# Patient Record
Sex: Female | Born: 1980
Health system: Southern US, Community
[De-identification: ages and names within clinical notes are randomized; demographics above are authoritative.]

## PROBLEM LIST (undated history)

## (undated) DIAGNOSIS — E039 Hypothyroidism, unspecified: Secondary | ICD-10-CM

## (undated) DIAGNOSIS — T7840XA Allergy, unspecified, initial encounter: Secondary | ICD-10-CM

## (undated) DIAGNOSIS — F419 Anxiety disorder, unspecified: Secondary | ICD-10-CM

## (undated) DIAGNOSIS — G250 Essential tremor: Secondary | ICD-10-CM

## (undated) DIAGNOSIS — G43909 Migraine, unspecified, not intractable, without status migrainosus: Secondary | ICD-10-CM

## (undated) DIAGNOSIS — R55 Syncope and collapse: Secondary | ICD-10-CM

## (undated) DIAGNOSIS — G35 Multiple sclerosis: Secondary | ICD-10-CM

## (undated) DIAGNOSIS — R251 Tremor, unspecified: Secondary | ICD-10-CM

## (undated) DIAGNOSIS — F909 Attention-deficit hyperactivity disorder, unspecified type: Secondary | ICD-10-CM

## (undated) DIAGNOSIS — E079 Disorder of thyroid, unspecified: Secondary | ICD-10-CM

## (undated) DIAGNOSIS — Z87898 Personal history of other specified conditions: Secondary | ICD-10-CM

## (undated) DIAGNOSIS — J4520 Mild intermittent asthma, uncomplicated: Secondary | ICD-10-CM

## (undated) DIAGNOSIS — R269 Unspecified abnormalities of gait and mobility: Secondary | ICD-10-CM

## (undated) DIAGNOSIS — R112 Nausea with vomiting, unspecified: Secondary | ICD-10-CM

## (undated) DIAGNOSIS — Z973 Presence of spectacles and contact lenses: Secondary | ICD-10-CM

## (undated) DIAGNOSIS — E0590 Thyrotoxicosis, unspecified without thyrotoxic crisis or storm: Secondary | ICD-10-CM

## (undated) DIAGNOSIS — T8859XA Other complications of anesthesia, initial encounter: Secondary | ICD-10-CM

## (undated) DIAGNOSIS — K219 Gastro-esophageal reflux disease without esophagitis: Secondary | ICD-10-CM

## (undated) DIAGNOSIS — S62609A Fracture of unspecified phalanx of unspecified finger, initial encounter for closed fracture: Secondary | ICD-10-CM

## (undated) DIAGNOSIS — Z9889 Other specified postprocedural states: Secondary | ICD-10-CM

## (undated) HISTORY — PX: FOOT SURGERY: SHX648

## (undated) HISTORY — PX: FINGER SURGERY: SHX640

## (undated) HISTORY — DX: Thyrotoxicosis, unspecified without thyrotoxic crisis or storm: E05.90

## (undated) HISTORY — DX: Syncope and collapse: R55

## (undated) HISTORY — DX: Hypothyroidism, unspecified: E03.9

## (undated) HISTORY — DX: Tremor, unspecified: R25.1

## (undated) HISTORY — DX: Multiple sclerosis: G35

## (undated) HISTORY — DX: Migraine, unspecified, not intractable, without status migrainosus: G43.909

## (undated) HISTORY — DX: Disorder of thyroid, unspecified: E07.9

## (undated) HISTORY — DX: Allergy, unspecified, initial encounter: T78.40XA

## (undated) HISTORY — PX: BUNIONECTOMY: SHX129

## (undated) HISTORY — DX: Gastro-esophageal reflux disease without esophagitis: K21.9

## (undated) SURGERY — FUNDOPLICATION, STOMACH, INCISIONLESS, ORAL APPROACH
Anesthesia: General

---

## 2003-02-27 HISTORY — PX: BUNIONECTOMY: SHX129

## 2004-04-06 ENCOUNTER — Ambulatory Visit: Payer: Self-pay | Admitting: Internal Medicine

## 2004-10-26 ENCOUNTER — Ambulatory Visit: Payer: Self-pay | Admitting: Internal Medicine

## 2004-12-08 ENCOUNTER — Ambulatory Visit: Payer: Self-pay | Admitting: Internal Medicine

## 2004-12-12 ENCOUNTER — Ambulatory Visit: Payer: Self-pay

## 2004-12-15 ENCOUNTER — Ambulatory Visit: Payer: Self-pay | Admitting: Neurology

## 2005-01-03 ENCOUNTER — Ambulatory Visit: Payer: Self-pay | Admitting: Internal Medicine

## 2005-01-05 ENCOUNTER — Ambulatory Visit: Payer: Self-pay | Admitting: Neurology

## 2005-06-04 ENCOUNTER — Emergency Department: Payer: Self-pay | Admitting: Emergency Medicine

## 2005-06-18 ENCOUNTER — Ambulatory Visit: Payer: Self-pay | Admitting: Neurology

## 2005-06-21 ENCOUNTER — Ambulatory Visit (HOSPITAL_COMMUNITY): Admission: RE | Admit: 2005-06-21 | Discharge: 2005-06-21 | Payer: Self-pay | Admitting: Neurology

## 2005-06-28 ENCOUNTER — Ambulatory Visit: Payer: Self-pay | Admitting: General Practice

## 2005-07-12 ENCOUNTER — Ambulatory Visit: Payer: Self-pay | Admitting: Neurology

## 2005-07-27 ENCOUNTER — Ambulatory Visit: Payer: Self-pay | Admitting: General Practice

## 2005-10-15 ENCOUNTER — Encounter: Payer: Self-pay | Admitting: Neurology

## 2005-10-27 ENCOUNTER — Encounter: Payer: Self-pay | Admitting: Neurology

## 2005-12-28 ENCOUNTER — Ambulatory Visit: Payer: Self-pay | Admitting: Neurology

## 2006-01-03 ENCOUNTER — Ambulatory Visit: Payer: Self-pay | Admitting: Neurology

## 2006-01-09 ENCOUNTER — Encounter (HOSPITAL_COMMUNITY): Admission: RE | Admit: 2006-01-09 | Discharge: 2006-02-21 | Payer: Self-pay | Admitting: Neurology

## 2006-01-29 ENCOUNTER — Ambulatory Visit: Payer: Self-pay | Admitting: Oncology

## 2006-02-26 ENCOUNTER — Ambulatory Visit: Payer: Self-pay | Admitting: Oncology

## 2006-03-29 ENCOUNTER — Ambulatory Visit: Payer: Self-pay | Admitting: Oncology

## 2006-04-27 ENCOUNTER — Ambulatory Visit: Payer: Self-pay | Admitting: Oncology

## 2006-05-28 ENCOUNTER — Ambulatory Visit: Payer: Self-pay | Admitting: Oncology

## 2006-06-03 ENCOUNTER — Ambulatory Visit: Payer: Self-pay | Admitting: Neurology

## 2006-06-07 ENCOUNTER — Ambulatory Visit: Payer: Self-pay | Admitting: Neurology

## 2006-06-27 ENCOUNTER — Ambulatory Visit: Payer: Self-pay | Admitting: Oncology

## 2006-07-28 ENCOUNTER — Ambulatory Visit: Payer: Self-pay | Admitting: Oncology

## 2006-08-27 ENCOUNTER — Ambulatory Visit: Payer: Self-pay | Admitting: Oncology

## 2006-09-27 ENCOUNTER — Ambulatory Visit: Payer: Self-pay | Admitting: Oncology

## 2006-10-25 ENCOUNTER — Ambulatory Visit: Payer: Self-pay | Admitting: Internal Medicine

## 2006-10-28 ENCOUNTER — Ambulatory Visit: Payer: Self-pay | Admitting: Oncology

## 2006-11-25 ENCOUNTER — Ambulatory Visit: Payer: Self-pay | Admitting: Neurology

## 2006-11-27 ENCOUNTER — Ambulatory Visit: Payer: Self-pay | Admitting: Neurology

## 2006-11-27 ENCOUNTER — Ambulatory Visit: Payer: Self-pay | Admitting: Oncology

## 2006-12-28 ENCOUNTER — Ambulatory Visit: Payer: Self-pay | Admitting: Oncology

## 2007-01-27 ENCOUNTER — Ambulatory Visit: Payer: Self-pay | Admitting: Oncology

## 2007-02-27 ENCOUNTER — Ambulatory Visit: Payer: Self-pay | Admitting: Oncology

## 2007-03-30 ENCOUNTER — Ambulatory Visit: Payer: Self-pay | Admitting: Oncology

## 2007-04-27 ENCOUNTER — Ambulatory Visit: Payer: Self-pay | Admitting: Oncology

## 2007-04-30 ENCOUNTER — Ambulatory Visit: Payer: Self-pay | Admitting: Internal Medicine

## 2007-05-13 ENCOUNTER — Ambulatory Visit: Payer: Self-pay | Admitting: Neurology

## 2007-05-20 ENCOUNTER — Ambulatory Visit: Payer: Self-pay

## 2007-05-21 ENCOUNTER — Ambulatory Visit: Payer: Self-pay | Admitting: Neurology

## 2007-05-26 ENCOUNTER — Ambulatory Visit: Payer: Self-pay | Admitting: Internal Medicine

## 2007-05-28 ENCOUNTER — Ambulatory Visit: Payer: Self-pay | Admitting: Oncology

## 2007-06-12 ENCOUNTER — Ambulatory Visit: Payer: Self-pay | Admitting: Specialist

## 2007-06-17 ENCOUNTER — Ambulatory Visit: Payer: Self-pay | Admitting: Specialist

## 2007-06-27 ENCOUNTER — Ambulatory Visit: Payer: Self-pay | Admitting: Oncology

## 2007-07-01 ENCOUNTER — Ambulatory Visit: Payer: Self-pay | Admitting: Allergy

## 2007-07-28 ENCOUNTER — Ambulatory Visit: Payer: Self-pay | Admitting: Oncology

## 2007-08-27 ENCOUNTER — Ambulatory Visit: Payer: Self-pay | Admitting: Oncology

## 2007-09-27 ENCOUNTER — Ambulatory Visit: Payer: Self-pay | Admitting: Oncology

## 2007-10-28 ENCOUNTER — Ambulatory Visit: Payer: Self-pay | Admitting: Oncology

## 2007-11-19 ENCOUNTER — Ambulatory Visit: Payer: Self-pay | Admitting: Neurology

## 2007-11-27 ENCOUNTER — Ambulatory Visit: Payer: Self-pay | Admitting: Oncology

## 2007-12-28 ENCOUNTER — Ambulatory Visit: Payer: Self-pay | Admitting: Oncology

## 2008-01-19 ENCOUNTER — Ambulatory Visit: Payer: Self-pay | Admitting: Sports Medicine

## 2008-01-27 ENCOUNTER — Ambulatory Visit: Payer: Self-pay | Admitting: Oncology

## 2008-02-27 ENCOUNTER — Ambulatory Visit: Payer: Self-pay | Admitting: Oncology

## 2008-02-27 HISTORY — PX: KNEE ARTHROSCOPY W/ ACL RECONSTRUCTION: SHX1858

## 2008-03-01 ENCOUNTER — Ambulatory Visit: Payer: Self-pay | Admitting: Neurology

## 2008-03-29 ENCOUNTER — Ambulatory Visit: Payer: Self-pay | Admitting: Oncology

## 2008-03-29 HISTORY — PX: KNEE ARTHROSCOPY W/ ACL RECONSTRUCTION: SHX1858

## 2008-04-26 ENCOUNTER — Ambulatory Visit: Payer: Self-pay | Admitting: Oncology

## 2008-05-12 ENCOUNTER — Ambulatory Visit: Payer: Self-pay | Admitting: Neurology

## 2008-05-13 ENCOUNTER — Ambulatory Visit: Payer: Self-pay | Admitting: Neurology

## 2008-05-24 ENCOUNTER — Ambulatory Visit: Payer: Self-pay | Admitting: Neurology

## 2008-05-27 ENCOUNTER — Ambulatory Visit: Payer: Self-pay | Admitting: Oncology

## 2008-06-26 ENCOUNTER — Ambulatory Visit: Payer: Self-pay | Admitting: Oncology

## 2008-07-27 ENCOUNTER — Ambulatory Visit: Payer: Self-pay | Admitting: Oncology

## 2008-08-26 ENCOUNTER — Ambulatory Visit: Payer: Self-pay | Admitting: Oncology

## 2008-09-26 ENCOUNTER — Ambulatory Visit: Payer: Self-pay | Admitting: Oncology

## 2008-10-27 ENCOUNTER — Ambulatory Visit: Payer: Self-pay | Admitting: Oncology

## 2008-11-02 ENCOUNTER — Other Ambulatory Visit: Payer: Self-pay | Admitting: Internal Medicine

## 2008-11-15 ENCOUNTER — Ambulatory Visit: Payer: Self-pay | Admitting: Neurology

## 2008-11-22 ENCOUNTER — Other Ambulatory Visit: Payer: Self-pay | Admitting: Neurology

## 2008-11-26 ENCOUNTER — Ambulatory Visit: Payer: Self-pay | Admitting: Oncology

## 2008-12-27 ENCOUNTER — Ambulatory Visit: Payer: Self-pay | Admitting: Oncology

## 2009-01-26 ENCOUNTER — Ambulatory Visit: Payer: Self-pay | Admitting: Oncology

## 2009-02-10 ENCOUNTER — Ambulatory Visit: Payer: Self-pay | Admitting: General Practice

## 2009-02-26 ENCOUNTER — Ambulatory Visit: Payer: Self-pay | Admitting: Oncology

## 2009-03-29 ENCOUNTER — Ambulatory Visit: Payer: Self-pay | Admitting: Oncology

## 2009-04-26 ENCOUNTER — Ambulatory Visit: Payer: Self-pay | Admitting: Oncology

## 2009-04-27 ENCOUNTER — Other Ambulatory Visit: Payer: Self-pay | Admitting: Diagnostic Radiology

## 2009-04-28 ENCOUNTER — Ambulatory Visit: Payer: Self-pay | Admitting: Neurology

## 2009-05-27 ENCOUNTER — Ambulatory Visit: Payer: Self-pay | Admitting: Oncology

## 2009-06-26 ENCOUNTER — Ambulatory Visit: Payer: Self-pay | Admitting: Oncology

## 2009-07-27 ENCOUNTER — Ambulatory Visit: Payer: Self-pay | Admitting: Oncology

## 2009-08-26 ENCOUNTER — Ambulatory Visit: Payer: Self-pay | Admitting: Oncology

## 2009-09-26 ENCOUNTER — Ambulatory Visit: Payer: Self-pay | Admitting: Oncology

## 2009-10-27 ENCOUNTER — Ambulatory Visit: Payer: Self-pay | Admitting: Oncology

## 2009-11-09 ENCOUNTER — Other Ambulatory Visit: Payer: Self-pay | Admitting: Internal Medicine

## 2009-11-09 ENCOUNTER — Other Ambulatory Visit: Payer: Self-pay | Admitting: Radiology

## 2009-11-09 ENCOUNTER — Other Ambulatory Visit: Payer: Self-pay | Admitting: Neurology

## 2009-11-10 ENCOUNTER — Ambulatory Visit: Payer: Self-pay | Admitting: Neurology

## 2009-11-26 ENCOUNTER — Ambulatory Visit: Payer: Self-pay | Admitting: Oncology

## 2009-12-27 ENCOUNTER — Ambulatory Visit: Payer: Self-pay | Admitting: Oncology

## 2010-01-26 ENCOUNTER — Ambulatory Visit: Payer: Self-pay | Admitting: Oncology

## 2010-02-26 ENCOUNTER — Ambulatory Visit: Payer: Self-pay | Admitting: Oncology

## 2010-03-29 ENCOUNTER — Ambulatory Visit: Payer: Self-pay | Admitting: Oncology

## 2010-04-27 ENCOUNTER — Ambulatory Visit: Payer: Self-pay | Admitting: Oncology

## 2010-05-03 ENCOUNTER — Other Ambulatory Visit: Payer: Self-pay | Admitting: Radiology

## 2010-05-03 ENCOUNTER — Other Ambulatory Visit: Payer: Self-pay | Admitting: Neurology

## 2010-05-04 ENCOUNTER — Ambulatory Visit: Payer: Self-pay | Admitting: Neurology

## 2010-05-28 ENCOUNTER — Ambulatory Visit: Payer: Self-pay | Admitting: Oncology

## 2010-06-27 ENCOUNTER — Ambulatory Visit: Payer: Self-pay | Admitting: Oncology

## 2010-07-28 ENCOUNTER — Ambulatory Visit: Payer: Self-pay | Admitting: Oncology

## 2010-08-27 ENCOUNTER — Ambulatory Visit: Payer: Self-pay | Admitting: Oncology

## 2010-09-17 ENCOUNTER — Ambulatory Visit: Payer: Self-pay | Admitting: Internal Medicine

## 2010-09-27 ENCOUNTER — Ambulatory Visit: Payer: Self-pay | Admitting: Oncology

## 2010-10-28 ENCOUNTER — Ambulatory Visit: Payer: Self-pay | Admitting: Oncology

## 2010-11-02 ENCOUNTER — Other Ambulatory Visit: Payer: Self-pay | Admitting: Neurology

## 2010-11-03 ENCOUNTER — Ambulatory Visit: Payer: Self-pay | Admitting: Neurology

## 2010-11-27 ENCOUNTER — Ambulatory Visit: Payer: Self-pay | Admitting: Oncology

## 2010-12-28 ENCOUNTER — Ambulatory Visit: Payer: Self-pay | Admitting: Oncology

## 2011-01-27 ENCOUNTER — Ambulatory Visit: Payer: Self-pay | Admitting: Oncology

## 2011-02-27 ENCOUNTER — Ambulatory Visit: Payer: Self-pay | Admitting: Oncology

## 2011-03-28 ENCOUNTER — Other Ambulatory Visit: Payer: Self-pay | Admitting: Internal Medicine

## 2011-03-28 LAB — TSH: Thyroid Stimulating Horm: 1.42 u[IU]/mL

## 2011-03-28 LAB — T4, FREE: Free Thyroxine: 1.2 ng/dL (ref 0.76–1.46)

## 2011-03-30 ENCOUNTER — Ambulatory Visit: Payer: Self-pay | Admitting: Oncology

## 2011-04-27 ENCOUNTER — Ambulatory Visit: Payer: Self-pay | Admitting: Oncology

## 2011-05-15 ENCOUNTER — Other Ambulatory Visit: Payer: Self-pay | Admitting: Psychiatry

## 2011-05-15 LAB — COMPREHENSIVE METABOLIC PANEL
Albumin: 3.8 g/dL (ref 3.4–5.0)
Alkaline Phosphatase: 31 U/L — ABNORMAL LOW (ref 50–136)
Anion Gap: 12 (ref 7–16)
BUN: 14 mg/dL (ref 7–18)
Calcium, Total: 8.9 mg/dL (ref 8.5–10.1)
Chloride: 106 mmol/L (ref 98–107)
EGFR (African American): >60
EGFR (Non-African Amer.): >60
Glucose: 92 mg/dL (ref 65–99)
Osmolality: 283 (ref 275–301)
Potassium: 3.7 mmol/L (ref 3.5–5.1)
SGOT(AST): 20 U/L (ref 15–37)
Sodium: 142 mmol/L (ref 136–145)
Total Protein: 7.3 g/dL (ref 6.4–8.2)

## 2011-05-28 ENCOUNTER — Ambulatory Visit: Payer: Self-pay | Admitting: Oncology

## 2011-06-27 ENCOUNTER — Ambulatory Visit: Payer: Self-pay | Admitting: Oncology

## 2011-07-28 ENCOUNTER — Ambulatory Visit: Payer: Self-pay | Admitting: Oncology

## 2011-08-27 ENCOUNTER — Ambulatory Visit: Payer: Self-pay | Admitting: Oncology

## 2011-09-27 ENCOUNTER — Ambulatory Visit: Payer: Self-pay | Admitting: Oncology

## 2011-10-28 ENCOUNTER — Ambulatory Visit: Payer: Self-pay | Admitting: Oncology

## 2011-10-31 ENCOUNTER — Ambulatory Visit: Payer: Self-pay | Admitting: Psychiatry

## 2011-11-06 DIAGNOSIS — N3020 Other chronic cystitis without hematuria: Secondary | ICD-10-CM | POA: Insufficient documentation

## 2011-11-06 DIAGNOSIS — N393 Stress incontinence (female) (male): Secondary | ICD-10-CM | POA: Insufficient documentation

## 2011-11-06 DIAGNOSIS — R339 Retention of urine, unspecified: Secondary | ICD-10-CM | POA: Insufficient documentation

## 2011-11-06 DIAGNOSIS — R310 Gross hematuria: Secondary | ICD-10-CM | POA: Insufficient documentation

## 2011-11-06 DIAGNOSIS — R350 Frequency of micturition: Secondary | ICD-10-CM | POA: Insufficient documentation

## 2011-11-07 ENCOUNTER — Ambulatory Visit: Payer: Self-pay | Admitting: Urology

## 2011-11-27 ENCOUNTER — Ambulatory Visit: Payer: Self-pay | Admitting: Oncology

## 2011-12-28 ENCOUNTER — Ambulatory Visit: Payer: Self-pay | Admitting: Oncology

## 2012-01-22 ENCOUNTER — Other Ambulatory Visit: Payer: Self-pay | Admitting: Obstetrics and Gynecology

## 2012-01-22 LAB — GLUCOSE, RANDOM: Glucose: 95 mg/dL (ref 65–99)

## 2012-01-27 ENCOUNTER — Ambulatory Visit: Payer: Self-pay | Admitting: Oncology

## 2012-02-27 ENCOUNTER — Ambulatory Visit: Payer: Self-pay | Admitting: Oncology

## 2012-03-29 ENCOUNTER — Ambulatory Visit: Payer: Self-pay | Admitting: Oncology

## 2012-04-26 ENCOUNTER — Ambulatory Visit: Payer: Self-pay | Admitting: Oncology

## 2012-04-26 ENCOUNTER — Ambulatory Visit: Payer: Self-pay

## 2012-05-09 ENCOUNTER — Other Ambulatory Visit: Payer: Self-pay | Admitting: Psychiatry

## 2012-05-09 ENCOUNTER — Other Ambulatory Visit: Payer: Self-pay | Admitting: Internal Medicine

## 2012-05-09 LAB — COMPREHENSIVE METABOLIC PANEL
Albumin: 3.4 g/dL (ref 3.4–5.0)
Alkaline Phosphatase: 55 U/L (ref 50–136)
Bilirubin,Total: 0.7 mg/dL (ref 0.2–1.0)
Calcium, Total: 8.6 mg/dL (ref 8.5–10.1)
Creatinine: 0.95 mg/dL (ref 0.60–1.30)
EGFR (African American): >60
EGFR (Non-African Amer.): >60
Glucose: 84 mg/dL (ref 65–99)
Osmolality: 276 (ref 275–301)
Potassium: 3.7 mmol/L (ref 3.5–5.1)
Total Protein: 6.7 g/dL (ref 6.4–8.2)

## 2012-05-09 LAB — T4, FREE: Free Thyroxine: 1.13 ng/dL (ref 0.76–1.46)

## 2012-05-27 ENCOUNTER — Ambulatory Visit: Payer: Self-pay | Admitting: Oncology

## 2012-06-26 ENCOUNTER — Ambulatory Visit: Payer: Self-pay | Admitting: Oncology

## 2012-07-08 ENCOUNTER — Telehealth: Payer: Self-pay | Admitting: Neurology

## 2012-07-08 NOTE — Telephone Encounter (Signed)
Called patient her father answered and stated she was at work he will have her call back.

## 2012-07-17 ENCOUNTER — Telehealth: Payer: Self-pay | Admitting: Neurology

## 2012-07-23 ENCOUNTER — Telehealth: Payer: Self-pay | Admitting: Neurology

## 2012-07-23 NOTE — Telephone Encounter (Signed)
Patient is calling to make an appointment with her assigned Neurologist.  She is a former patient of Dr. Imagene Gurney.  She would like to come in asap, however, having no issues at this time.  Her call back phone is:  [phone] and you can leave a voice mail message per patient, if she does not answer the phone.

## 2012-07-23 NOTE — Telephone Encounter (Signed)
Printed note off and gave to Sandy Young to assign and get scheduled.  

## 2012-07-24 ENCOUNTER — Telehealth: Payer: Self-pay | Admitting: Diagnostic Neuroimaging

## 2012-07-24 NOTE — Telephone Encounter (Signed)
Former pt of Dr Sandria Manly.  Left message on pt's work number letting her know that we have received her call and that she is being assigned to a physician and will be called with an appointment once the assignment has been made.

## 2012-07-27 ENCOUNTER — Ambulatory Visit: Payer: Self-pay | Admitting: Oncology

## 2012-08-26 ENCOUNTER — Ambulatory Visit: Payer: Self-pay | Admitting: Oncology

## 2012-09-06 ENCOUNTER — Encounter: Payer: Self-pay | Admitting: Nurse Practitioner

## 2012-09-06 DIAGNOSIS — M25562 Pain in left knee: Secondary | ICD-10-CM

## 2012-09-06 DIAGNOSIS — R948 Abnormal results of function studies of other organs and systems: Secondary | ICD-10-CM

## 2012-09-06 DIAGNOSIS — G35 Multiple sclerosis: Secondary | ICD-10-CM

## 2012-09-06 DIAGNOSIS — R209 Unspecified disturbances of skin sensation: Secondary | ICD-10-CM | POA: Insufficient documentation

## 2012-09-06 DIAGNOSIS — G8110 Spastic hemiplegia affecting unspecified side: Secondary | ICD-10-CM

## 2012-09-06 DIAGNOSIS — M25569 Pain in unspecified knee: Secondary | ICD-10-CM | POA: Insufficient documentation

## 2012-09-06 DIAGNOSIS — R471 Dysarthria and anarthria: Secondary | ICD-10-CM | POA: Insufficient documentation

## 2012-09-12 ENCOUNTER — Ambulatory Visit (INDEPENDENT_AMBULATORY_CARE_PROVIDER_SITE_OTHER): Payer: 59 | Admitting: Neurology

## 2012-09-12 ENCOUNTER — Encounter: Payer: Self-pay | Admitting: Neurology

## 2012-09-12 VITALS — BP 120/79 | HR 102 | Ht 62.5 in | Wt 162.0 lb

## 2012-09-12 DIAGNOSIS — G35 Multiple sclerosis: Secondary | ICD-10-CM

## 2012-09-12 DIAGNOSIS — R5381 Other malaise: Secondary | ICD-10-CM

## 2012-09-12 DIAGNOSIS — R4189 Other symptoms and signs involving cognitive functions and awareness: Secondary | ICD-10-CM

## 2012-09-12 DIAGNOSIS — R5383 Other fatigue: Secondary | ICD-10-CM

## 2012-09-12 MED ORDER — ALPRAZOLAM 0.5 MG PO TABS: 0.5000 mg | ORAL_TABLET | Freq: Two times a day (BID) | ORAL | Status: DC

## 2012-09-12 MED ORDER — METHYLPHENIDATE HCL ER (OSM) 18 MG PO TBCR: 18.0000 mg | EXTENDED_RELEASE_TABLET | ORAL | Status: DC

## 2012-09-12 NOTE — Progress Notes (Signed)
[name] is a 31y/o woman with diagnosis of RRMS presenting for follow up/re-establishing care in our office. She was last seen at [facility] in 10/2010 with Dr Carrie Nelson. Since that visit she has been following up with...  Interim: Since her last visit she has been following with Dr Carrie Nelson at [facility] in [place], [place]. He recently moved out of the area so she has returned to [facility]. Overall states she is doing well. No flares ups of MS noted in over 2 years. Continues on Tysabri monthly. Has been on Tysabri since 12/2005. Continues to tolerate it well. Had JC virus ab negative test in April 2014, had normal LFTs at that time too.  Main concern today is concern with cognitive difficulties and fatigue. She feels this is starting to cause difficulties with work and life. Was started on Ritalin by Dr Carrie Nelson, was started on IR dose but notes that the benefit does not last for her. Is interested in trying long acting version.   Otherwise no acute complaints. Remains active. Off baclofen, denies any muscle spasms.  Has not had MRI imaging in over a year. Wishes to hold off at this point.  Prior visit per Dr Carrie Nelson: left-handed white single female from [place], [place] with a history of multiple sclerosis diagnosed in October 2006 but in retrospect was probably present on MRI 03/2002 showing 2 areas of long T2 signal present in the left basal ganglia when she was being evaluated for syncope. She had left facial numbness, decreased taste,  and left arm numbness.Evaluation for disorders that mimic MS has been negative. CSF studies were positive. Last MRI of the brain and  cervical spine 11/03/10 as compared with previous MRIs 05/04/10 shows "stable findings", no lesions are present in the cervical spine and approximately 30 are in the supratentorial regions of the brain and brainstem. I reviewed this with the patient today. Her cervical spine MRI is to T6 in the sagittal plane. She has been on  Tysabri since 01/09/2006 with approximately 58 treatments given. Last blood studies 11/02/10 showed normal CBC, CMP, and negativeJC virus antibodies.  ROS: Constitutional: + fatigue and cognitive issues Denies fever, weight loss, weight gain Eyes: Denies blurry vision, loss of vision, eye pain CV: Denies chest pain, palpitations, syncope Pulm: Denies SOB, dyspnea, cough GI:  Denies constipation, diarrhea, abdominal pain MSK: Denies spasms, muscle pain, weakness Neuro: Denies HA, vertigo, falls, tremor Psyc:  Denies depression, hallucinatons, confusion Hem/lymph: Denies easy bleeding, bruising, no swollen nodes Allergic: No runny nose, hives, rashes All other ROS are negative    Exam: Gen: NAD, pleasant CV: RRR no m/r/g Pulm: CTA bilat Abd: +BS, soft, NT, ND   Neuor Exam: MS: AA&Ox3, appropriately interactive, normal affect   Attention: WORLD backwards  Speech: fluent w/o paraphasic error  Memory: good recent and remote recall  CN: PERRL, EOMI no nystagmus, no ptosis, sensation intact to LT V1-V3 bilat, face symmetric, no weakness, hearing grossly intact, palate elevates symmetrically, shoulder shrug 5/5 bilat,  tongue protrudes midline, no fasiculations noted.  Motor: normal bulk and tone Strength: 5/5  In all extremities  Coord: rapid alternating and point-to-point (FNF, HTS) movements intact. Refl:  Symmetrical, bilat downgoing toes  Sens: LT intact in all extremities  Gait: posture, stance, stride and arm-swing normal. Tandem gait intact. Able to walk on heels and toes. Romberg absent.  A/P: [name] is a 31y/o woman with a pmhx of RRMS for which she is well controlled on monthly Tysabri.  She has been on Tysabri since Nov 2011 and continues to be JC virus negative with otherwise normal lab workup. She has not had a flare up in over 2 years. Last imaging was >93yr ago. Main concern at todays visit is fatigue and cognitive issues. She has been taking ritalin  10mg  which gives benefit but does not give prolonged relief.  -will change from ritalin to concerta 18mg  daily. Patient educated on benefits and risks of this medication -in the future if no benefit would consider trial of Provigil or Amantadine -continue monthly Tysabri. Will check labs at next visit and then plan for quarterly monitoring per guidelines -will closely monitor for signs of PML -follow up in 2 months   A total of 60 minutes was spent in with this patient. Over half this time was spent on counseling patient on the diagnosis and different therapeutic options available.

## 2012-09-12 NOTE — Patient Instructions (Addendum)
Overall you are doing fairly well but I do want to suggest a few things today:   As far as your medications are concerned, I would like to suggest a few things: -we are going to switch you from immediate release ritalin to Concerta which is a long acting form. You will now be taking 18mg  tablets once a day in the morning. I suggest trying this on the weekend and if you are unable to tolerate then consider going back to the immediate release form. -a refill was given of your xanax  As far as diagnostic testing: we discussed MRI of the brain and spine but will hold off at this time. Please obtain old images for [place]. We will likely repeat this test in the next year. We will hold off on lab work at this time and will plan to repeat at the next visit  I would like to see you back in 2 months, sooner if we need to. Please call us with any interim questions, concerns, problems, updates or refill requests.   Please also call us for any test results so we can go over those with you on the phone.  My clinical assistant and will answer any of your questions and relay your messages to me and also relay most of my messages to you.   Our phone number is [phone]. We also have an after hours call service for urgent matters and there is a physician on-call for urgent questions. For any emergencies you know to call 911 or go to the nearest emergency room

## 2012-09-15 ENCOUNTER — Telehealth: Payer: Self-pay | Admitting: Neurology

## 2012-09-16 NOTE — Telephone Encounter (Signed)
Lupita Leash was taking care of this issue, because the patient has changed physician and so this has to go the authorization process again.

## 2012-09-18 ENCOUNTER — Telehealth: Payer: Self-pay | Admitting: Neurology

## 2012-09-26 ENCOUNTER — Ambulatory Visit: Payer: Self-pay | Admitting: Oncology

## 2012-09-29 ENCOUNTER — Telehealth: Payer: Self-pay | Admitting: Neurology

## 2012-09-29 DIAGNOSIS — Z0289 Encounter for other administrative examinations: Secondary | ICD-10-CM

## 2012-09-29 NOTE — Telephone Encounter (Signed)
I contacted the pharmacy.  They state insurance will not cover Concerta.  I have submitted prior auth paperwork to ins, pending response.

## 2012-10-02 ENCOUNTER — Telehealth: Payer: Self-pay

## 2012-10-02 NOTE — Telephone Encounter (Signed)
[employer] faxed [place] an approval letter for Concerta (Methylphenidate) effective 10/01/2012-10/01/2013 ID # [id number]

## 2012-10-08 ENCOUNTER — Telehealth: Payer: Self-pay | Admitting: Neurology

## 2012-10-16 ENCOUNTER — Telehealth: Payer: Self-pay

## 2012-10-16 NOTE — Telephone Encounter (Signed)
I called and spoke with patient. I reviewed her present status. I will fax her a release of information today to [phone]. When forms are signed by MD, they will be faxed to [facility] at Fax: [phone]

## 2012-10-17 ENCOUNTER — Telehealth: Payer: Self-pay | Admitting: *Deleted

## 2012-10-21 ENCOUNTER — Other Ambulatory Visit: Payer: Self-pay | Admitting: Neurology

## 2012-10-21 DIAGNOSIS — G35 Multiple sclerosis: Secondary | ICD-10-CM

## 2012-10-21 DIAGNOSIS — R4189 Other symptoms and signs involving cognitive functions and awareness: Secondary | ICD-10-CM

## 2012-10-21 DIAGNOSIS — R5381 Other malaise: Secondary | ICD-10-CM

## 2012-10-21 DIAGNOSIS — R5383 Other fatigue: Secondary | ICD-10-CM

## 2012-10-21 MED ORDER — METHYLPHENIDATE HCL ER (OSM) 18 MG PO TBCR: 18.0000 mg | EXTENDED_RELEASE_TABLET | ORAL | Status: DC

## 2012-10-22 NOTE — Telephone Encounter (Signed)
Rx is ready for pick up.  I called the patient, got no answer.  Left message.  

## 2012-10-27 ENCOUNTER — Ambulatory Visit: Payer: Self-pay | Admitting: Oncology

## 2012-11-11 ENCOUNTER — Ambulatory Visit: Payer: 59 | Admitting: Neurology

## 2012-11-13 ENCOUNTER — Ambulatory Visit: Payer: 59 | Admitting: Neurology

## 2012-11-21 ENCOUNTER — Ambulatory Visit (INDEPENDENT_AMBULATORY_CARE_PROVIDER_SITE_OTHER): Payer: 59 | Admitting: Neurology

## 2012-11-21 ENCOUNTER — Encounter: Payer: Self-pay | Admitting: Neurology

## 2012-11-21 VITALS — BP 122/80 | HR 93 | Ht 62.0 in | Wt 160.0 lb

## 2012-11-21 DIAGNOSIS — G35 Multiple sclerosis: Secondary | ICD-10-CM

## 2012-11-21 DIAGNOSIS — R5383 Other fatigue: Secondary | ICD-10-CM

## 2012-11-21 DIAGNOSIS — R5381 Other malaise: Secondary | ICD-10-CM

## 2012-11-21 NOTE — Progress Notes (Signed)
Provider:  Dr Hosie Poisson Referring Provider: Lynnea Ferrier, MD Primary Care Physician:  Daniel Nones  CC: Carrie follow up  HPI:  Carrie Nelson is a 32 y.o. female here as a follow up. States she is doing well overall, no new complaints are concerned since last visit. No new weakness no sensory changes no visual changes no gait instability no falls no muscle spasms. She had her last in summary on one week ago. At last visit she was started on Concerta 18 mg daily for fatigue and impaired concentration. She reports this gives a very good benefit, has given her more energy during the daytime and still allowed her to sleep at night.  She reports that her and her husband are considering having children and wishes to discuss medication management in pregnancy. She notes that she would like her husband her and this will be discussed at her next visit. We discussed the need for blood work, including JC virus MI at this time and repeat MRI imaging as it is been a few years.   Concerns/Questions:Review of Systems: Out of a complete 14 system review, the patient complains of only the following symptoms, and all other reviewed systems are negative. No positive review of systems  History   Social History  . Marital Status: Single    Spouse Name: N/A    Number of Children: o  . Years of Education: N/A   Occupational History  . Not on file.   Social History Main Topics  . Smoking status: Never Smoker   . Smokeless tobacco: Never Used  . Alcohol Use: Yes     Comment: minimal weekly   . Drug Use: No  . Sexual Activity: Not on file   Other Topics Concern  . Not on file   Social History Narrative   Patient lives with parents.   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    Patient has a Probation officer.     Family History  Problem Relation Age of Onset  . Prostate cancer Father   . Diabetes Father   . Arthritis    . Dementia    .  Hypertension    . Heart disease      Past Medical History  Diagnosis Date  . Thyroid disease   . Syncope   . Multiple sclerosis   . Tremor     Past Surgical History  Procedure Laterality Date  . Knee arthroscopy w/ acl reconstruction Left 2010  . Bunionectomy    . Foot surgery      Current Outpatient Prescriptions  Medication Sig Dispense Refill  . ALPRAZolam (XANAX) 0.5 MG tablet Take 1 tablet (0.5 mg total) by mouth 2 (two) times daily.  60 tablet  3  . buPROPion (WELLBUTRIN XL) 150 MG 24 hr tablet Take 150 mg by mouth daily.      Marland Kitchen buPROPion (WELLBUTRIN) 100 MG tablet Take 100 mg by mouth 2 (two) times daily.      . Cetirizine HCl (ZYRTEC ALLERGY) 10 MG CAPS Take 10 mg by mouth daily.      . cholecalciferol (VITAMIN D) 1000 UNITS tablet Take 2,000 Units by mouth daily.       Marland Kitchen levothyroxine (SYNTHROID, LEVOTHROID) 25 MCG tablet Take 25 mcg by mouth daily before breakfast.      . methylphenidate (CONCERTA) 18 MG CR tablet Take 1 tablet (18 mg total) by mouth every morning.  30 tablet  0  . montelukast (SINGULAIR) 10 MG tablet Take 10 mg by mouth at bedtime.      . Multiple Vitamins-Minerals (MULTIVITAMIN PO) Take by mouth.      . Natalizumab (TYSABRI IV) Inject into the vein every 30 (thirty) days.      . NORETHINDRONE PO Take by mouth daily.       No current facility-administered medications for this visit.    Allergies as of 11/21/2012  . (No Known Allergies)    Vitals: BP 122/80  Pulse 93  Ht 5\' 2"  (1.575 m)  Wt 160 lb (72.576 kg)  BMI 29.26 kg/m2 Last Weight:  Wt Readings from Last 1 Encounters:  11/21/12 160 lb (72.576 kg)   Last Height:   Ht Readings from Last 1 Encounters:  11/21/12 5\' 2"  (1.575 m)     Physical exam: Exam: Gen: NAD, conversant Eyes: anicteric sclerae, moist conjunctivae HENT: Atraumatic, oropharynx clear Lungs: CTA, no wheezing, rales, rhonic                          CV: RRR, no MRG Abdomen: Soft, non-tender;  Extremities: No  peripheral edema  Skin: Normal temperature, no rash,  Psych: Appropriate affect, pleasant  Carrie: AA&Ox3, appropriately interactive, normal affect   Speech: fluent w/o paraphasic error   Memory: good recent and remote recall   CN:  PERRL, EOMI no nystagmus, no ptosis, sensation intact to LT V1-V3 bilat, face symmetric, no weakness, hearing grossly intact, palate elevates symmetrically, shoulder shrug 5/5 bilat,  tongue protrudes midline, no fasiculations noted.   Motor: normal bulk and tone  Strength:  5/5 In all extremities   Coord: rapid alternating and point-to-point (FNF, HTS) movements intact.  Refl:  Symmetrical, bilat downgoing toes  Sens: LT intact in all extremities   Gait: posture, stance, stride and arm-swing normal. Tandem gait intact. Able to walk on heels and toes. Romberg absent.    Assessment:  After physical and neurologic examination, review of laboratory studies, imaging, neurophysiology testing and pre-existing records, assessment will be reviewed on the problem list.  Plan:  Treatment plan and additional workup will be reviewed under Problem List.  1)Multiple sclerosis 2)Fatigue  Carrie Nelson is a pleasant 32 year old woman with history of relapsing remitting Carrie, currently well controlled on postoperative presenting for followup visit. She reports doing well with no acute concerns and no exacerbations. She got good benefit from Concerta 18 mg daily and is tolerating this well. No changes to her medication regimen will be made at this time. She notes that she and her husband are considering pregnancy and wishes to discuss this further at her next visit, with her husband present. We will order JC virus antibody, LFTs and MRI of the brain and C-spine at this time.

## 2012-11-21 NOTE — Patient Instructions (Addendum)
Overall you are doing fairly well but I do want to suggest a few things today:   We will check some blood work today and order a MRI of the brain and cervical spine  Continue on the Concerta at its current dose and schedule  I would like to see you back on December 9 at 2:30, sooner if we need to. Please call us with any interim questions, concerns, problems, updates or refill requests.   Please also call us for any test results so we can go over those with you on the phone.  My clinical assistant and will answer any of your questions and relay your messages to me and also relay most of my messages to you.   Our phone number is [phone]. We also have an after hours call service for urgent matters and there is a physician on-call for urgent questions. For any emergencies you know to call 911 or go to the nearest emergency room

## 2012-11-22 LAB — HEPATIC FUNCTION PANEL
Albumin: 4.4 g/dL (ref 3.5–5.5)
Total Protein: 6.4 g/dL (ref 6.0–8.5)

## 2012-11-26 ENCOUNTER — Ambulatory Visit: Payer: Self-pay | Admitting: Oncology

## 2012-12-25 ENCOUNTER — Other Ambulatory Visit: Payer: Self-pay | Admitting: Neurology

## 2012-12-25 DIAGNOSIS — G35 Multiple sclerosis: Secondary | ICD-10-CM

## 2012-12-27 ENCOUNTER — Ambulatory Visit: Payer: Self-pay | Admitting: Oncology

## 2013-01-02 ENCOUNTER — Telehealth: Payer: Self-pay | Admitting: Neurology

## 2013-01-06 NOTE — Telephone Encounter (Signed)
Patient needs medical appointment time to be extended to 4 hours on her FMLA for tysabri treatments. Requesting that be dated back to 10.01.2014 and faxed to the [facility] at [phone]. I will make that change now.

## 2013-01-26 ENCOUNTER — Ambulatory Visit: Payer: Self-pay | Admitting: Oncology

## 2013-01-28 ENCOUNTER — Other Ambulatory Visit: Payer: Self-pay

## 2013-01-29 ENCOUNTER — Ambulatory Visit: Payer: Self-pay | Admitting: Neurology

## 2013-02-12 ENCOUNTER — Encounter: Payer: Self-pay | Admitting: Neurology

## 2013-02-12 ENCOUNTER — Ambulatory Visit (INDEPENDENT_AMBULATORY_CARE_PROVIDER_SITE_OTHER): Payer: 59 | Admitting: Neurology

## 2013-02-12 VITALS — BP 119/73 | HR 86 | Ht 62.5 in | Wt 162.0 lb

## 2013-02-12 DIAGNOSIS — G35 Multiple sclerosis: Secondary | ICD-10-CM

## 2013-02-12 NOTE — Progress Notes (Signed)
Provider:  Dr Hosie Poisson Referring Provider: Lynnea Ferrier, MD Primary Care Physician:  Daniel Nones  CC: MS follow up  HPI:  Carrie Nelson is a 32 y.o. female here as a follow up. States she is doing well overall, no new complaints or concerns since last visit. No new weakness no sensory changes no visual changes no gait instability no falls no muscle spasms. Has been off Tysabri for around 6 weeks due to desire to get pregnant. Tolerating being off medication well, feels her fatigue has increased. Since last visit has had repeat brain MRI which was stable and cervical MRI which was unremarkable.   Currently seeing Melody Ines Bloomer, midwife, for pregnancy planning.    Concerns/Questions:Review of Systems: Out of a complete 14 system review, the patient complains of only the following symptoms, and all other reviewed systems are negative. No positive review of systems  History   Social History  . Marital Status: Single    Spouse Name: N/A    Number of Children: o  . Years of Education: N/A   Occupational History  . Not on file.   Social History Main Topics  . Smoking status: Never Smoker   . Smokeless tobacco: Never Used  . Alcohol Use: Yes     Comment: minimal weekly   . Drug Use: No  . Sexual Activity: Not on file   Other Topics Concern  . Not on file   Social History Narrative   Patient lives with parents.   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    Patient has a Probation officer.     Family History  Problem Relation Age of Onset  . Prostate cancer Father   . Diabetes Father   . Arthritis    . Dementia    . Hypertension    . Heart disease      Past Medical History  Diagnosis Date  . Thyroid disease   . Syncope   . Multiple sclerosis   . Tremor     Past Surgical History  Procedure Laterality Date  . Knee arthroscopy w/ acl reconstruction Left 2010  . Bunionectomy    . Foot surgery       Current Outpatient Prescriptions  Medication Sig Dispense Refill  . ALPRAZolam (XANAX) 0.5 MG tablet Take 1 tablet (0.5 mg total) by mouth 2 (two) times daily.  60 tablet  3  . buPROPion (WELLBUTRIN XL) 150 MG 24 hr tablet Take 150 mg by mouth daily.      Marland Kitchen buPROPion (WELLBUTRIN) 100 MG tablet Take 100 mg by mouth 2 (two) times daily.      . Cetirizine HCl (ZYRTEC ALLERGY) 10 MG CAPS Take 10 mg by mouth daily.      . cholecalciferol (VITAMIN D) 1000 UNITS tablet Take 2,000 Units by mouth daily.       Marland Kitchen levothyroxine (SYNTHROID, LEVOTHROID) 25 MCG tablet Take 25 mcg by mouth daily before breakfast.      . methylphenidate (CONCERTA) 18 MG CR tablet Take 1 tablet (18 mg total) by mouth every morning.  30 tablet  0  . montelukast (SINGULAIR) 10 MG tablet Take 10 mg by mouth at bedtime.      . Multiple Vitamins-Minerals (MULTIVITAMIN PO) Take by mouth.      . NORETHINDRONE PO Take by mouth daily.      . Natalizumab (TYSABRI IV) Inject into the vein every 30 (thirty) days.  No current facility-administered medications for this visit.    Allergies as of 02/12/2013  . (No Known Allergies)    Vitals: BP 119/73  Pulse 86  Ht 5' 2.5" (1.588 m)  Wt 162 lb (73.483 kg)  BMI 29.14 kg/m2 Last Weight:  Wt Readings from Last 1 Encounters:  02/12/13 162 lb (73.483 kg)   Last Height:   Ht Readings from Last 1 Encounters:  02/12/13 5' 2.5" (1.588 m)     Physical exam: Exam: Gen: NAD, conversant Eyes: anicteric sclerae, moist conjunctivae HENT: Atraumatic, oropharynx clear Lungs: CTA, no wheezing, rales, rhonic                          CV: RRR, no MRG Abdomen: Soft, non-tender;  Extremities: No peripheral edema  Skin: Normal temperature, no rash,  Psych: Appropriate affect, pleasant  MS: AA&Ox3, appropriately interactive, normal affect   Speech: fluent w/o paraphasic error   Memory: good recent and remote recall   CN:  PERRL, EOMI no nystagmus, no ptosis, sensation intact  to LT V1-V3 bilat, face symmetric, no weakness, hearing grossly intact, palate elevates symmetrically, shoulder shrug 5/5 bilat,  tongue protrudes midline, no fasiculations noted.   Motor: normal bulk and tone  Strength:  5/5 In all extremities   Coord: rapid alternating and point-to-point (FNF, HTS) movements intact.  Refl:  Symmetrical, bilat downgoing toes  Sens: LT intact in all extremities   Gait: posture, stance, stride and arm-swing normal. Tandem gait intact. Able to walk on heels and toes. Romberg absent.    Assessment:  After physical and neurologic examination, review of laboratory studies, imaging, neurophysiology testing and pre-existing records, assessment will be reviewed on the problem list.  Plan:  Treatment plan and additional workup will be reviewed under Problem List.  1)Multiple sclerosis 2)Fatigue  Ms Sheer is a pleasant 32 year old woman with history of relapsing remitting MS, currently well controlled presenting for follow up visit. She has been off Tysabri for around 6 weeks with desire to get pregnant. Counseled her that the recommendation is to be off Tysabri for 3 months prior to attempting to get pregnant. She will be working with her midwife to taper off other medications. While off Tysabri she will continue to take daily vitamin D. Counseled her that we will monitor her closely during the process. We discussed that the timing for when she re-starts Tysabri will depend on whether or not she does breast feeding or formula feeding. Will follow up as needed.

## 2013-03-05 ENCOUNTER — Telehealth: Payer: Self-pay | Admitting: *Deleted

## 2013-03-05 NOTE — Telephone Encounter (Signed)
I called and spoke to [employer] about correcting the name of the pt on there forms.  Should be Carrie Nelson vs. Carrie Nelson.   She would change this.

## 2013-03-26 DIAGNOSIS — Z0289 Encounter for other administrative examinations: Secondary | ICD-10-CM

## 2013-04-10 ENCOUNTER — Other Ambulatory Visit: Payer: Self-pay | Admitting: Neurology

## 2013-04-14 ENCOUNTER — Telehealth: Payer: Self-pay | Admitting: Neurology

## 2013-04-14 NOTE — Telephone Encounter (Signed)
Patient calling to follow up on FMLA paperwork, says she has been waiting for several weeks now and has not heard anything back yet. Please call patient and advise.

## 2013-04-14 NOTE — Telephone Encounter (Signed)
I called and spoke to pt .   Form filled out relating to MD appts and MS exacerbations.

## 2013-04-27 ENCOUNTER — Telehealth: Payer: Self-pay | Admitting: Neurology

## 2013-04-27 NOTE — Telephone Encounter (Signed)
I called pt.  Initial and date additions. Part B  # 1 and 2.  Part C  (middle).   MR to fax back.

## 2013-04-27 NOTE — Telephone Encounter (Signed)
Patient returning call, please call her back and advise.

## 2013-05-06 NOTE — Telephone Encounter (Signed)
Pt came in for her visit, closing encounter °

## 2013-06-02 NOTE — Telephone Encounter (Signed)
Closing encounter

## 2013-12-01 ENCOUNTER — Ambulatory Visit: Payer: Self-pay | Admitting: Obstetrics and Gynecology

## 2013-12-01 LAB — GLUCOSE, 3 HOUR
GLUCOSE 2 HOUR: 121 mg/dL
GLUCOSE 3 HOUR: 53 mg/dL
Glucose 1 Hour: 165 mg/dL
Glucose Fasting: 82 mg/dL (ref 70–110)

## 2014-01-22 ENCOUNTER — Inpatient Hospital Stay: Payer: Self-pay

## 2014-01-22 LAB — CBC WITH DIFFERENTIAL/PLATELET
BASOS ABS: 0.1 10*3/uL (ref 0.0–0.1)
Basophil %: 0.6 %
Eosinophil #: 0.1 10*3/uL (ref 0.0–0.7)
Eosinophil %: 0.8 %
HCT: 34.2 % — AB (ref 35.0–47.0)
HGB: 11.5 g/dL — AB (ref 12.0–16.0)
Lymphocyte #: 2.1 10*3/uL (ref 1.0–3.6)
Lymphocyte %: 21 %
MCH: 31 pg (ref 26.0–34.0)
MCHC: 33.7 g/dL (ref 32.0–36.0)
MCV: 92 fL (ref 80–100)
Monocyte #: 0.8 x10 3/mm (ref 0.2–0.9)
Monocyte %: 8.4 %
NEUTROS ABS: 7 10*3/uL — AB (ref 1.4–6.5)
NEUTROS PCT: 69.2 %
Platelet: 398 10*3/uL (ref 150–440)
RBC: 3.71 10*6/uL — AB (ref 3.80–5.20)
RDW: 13 % (ref 11.5–14.5)
WBC: 10.1 10*3/uL (ref 3.6–11.0)

## 2014-01-23 LAB — HEMATOCRIT: HCT: 27.3 % — AB (ref 35.0–47.0)

## 2014-03-09 ENCOUNTER — Encounter: Payer: Self-pay | Admitting: Neurology

## 2014-03-09 ENCOUNTER — Ambulatory Visit (INDEPENDENT_AMBULATORY_CARE_PROVIDER_SITE_OTHER): Payer: 59 | Admitting: Neurology

## 2014-03-09 VITALS — BP 130/90 | HR 64 | Resp 12 | Ht 62.0 in | Wt 161.0 lb

## 2014-03-09 DIAGNOSIS — R5382 Chronic fatigue, unspecified: Secondary | ICD-10-CM

## 2014-03-09 DIAGNOSIS — F988 Other specified behavioral and emotional disorders with onset usually occurring in childhood and adolescence: Secondary | ICD-10-CM

## 2014-03-09 DIAGNOSIS — G35 Multiple sclerosis: Secondary | ICD-10-CM

## 2014-03-09 DIAGNOSIS — Z79899 Other long term (current) drug therapy: Secondary | ICD-10-CM

## 2014-03-09 DIAGNOSIS — F909 Attention-deficit hyperactivity disorder, unspecified type: Secondary | ICD-10-CM

## 2014-03-09 MED ORDER — METHYLPHENIDATE HCL ER (OSM) 18 MG PO TBCR
EXTENDED_RELEASE_TABLET | ORAL | Status: DC
Start: 2014-03-09 — End: 2014-07-08

## 2014-03-09 NOTE — Progress Notes (Signed)
[facility]  PATIENT: Carrie Nelson DOB: February 17, 1981  REFERRING CLINICIAN: Daniel Nones HISTORY FROM: Patient   REASON FOR VISIT: Multiple Sclerosis   HISTORICAL  CHIEF COMPLAINT:  Chief Complaint  Patient presents with  . Multiple Sclerosis    HISTORY OF PRESENT ILLNESS:  Carrie Nelson is a 34 year old woman who was diagnosed with MS in 2007. However, in retrospect, she had right foot dragging that started around 2005. Then shortly after that she had an episode of left facial numbness. In 2007 she had an episode of slurred speech and worsening clumsiness. She was evaluated and had an MRI of the brain that was consistent with multiple sclerosis. She also had a lumbar puncture and her CSF was compatible with the diagnosis of MS. Most of her symptoms improved over the next few months but she did not get 100% back to baseline and she continues to have minimal residual weakness, clumsiness and numbness. She was initially placed on Betaseron but switched to Tysabri because of some generalized side effects of Betaseron. She was seeing Dr. Tinnie Gens 2007 and 2014 but transferred to this office after Dr. Leotis Shames moved to the [place] area.   She was on Tysabri between 2008 and late 2014 when she stopped because of a desire to get pregnant. She became pregnant in early 2015 and delivered in November 2015. She has not been on any disease modifying therapy since late 2014.  She occasionally has some mild neurologic issues. Currently she is reporting that the right leg is dragging at times. She also has some twitching in the eyelids. She has some fatigue. Not sure if this is due to her MS or her recent delivery. She notices decreased focus but does not have any significant memory issues or other cognitive concerns. Bladder function is fine.  She denies any depression or anxiety. However, she does note that she gets irritable at times. She feels this might be related to her increased  fatigue.  She has had some difficulty with attention and focus. Has been helped more by Concerta than with IR Ritalin. She tolerated it well.  I reviewed some data from the past. In 2014, there was a note reporting that she had about 30 foci in the supratentorial component of the brain and some of the brainstem. However, the cervical spinal cord (to T6) did not have any definite lesions by report.   REVIEW OF SYSTEMS:  Constitutional: No fevers, chills, sweats, or change in appetite Eyes: No visual changes, double vision, eye pain Ear, nose and throat: No hearing loss, ear pain, nasal congestion, sore throat Cardiovascular: No chest pain, palpitations Respiratory:  No shortness of breath at rest or with exertion.   No wheezes GastrointestinaI: No nausea, vomiting, diarrhea, abdominal pain, fecal incontinence Genitourinary:  No dysuria, urinary retention or frequency.  No nocturia. Musculoskeletal:  No neck pain, back pain Integumentary: No rash, pruritus, skin lesions Neurological: as above Psychiatric: No depression at this time.  No anxiety.   Occasionally she has irritability. Endocrine: She she is borderline diabetic. No palpitations, diaphoresis, change in appetite, change in weigh or increased thirst Hematologic/Lymphatic:  No anemia, purpura, petechiae. Allergic/Immunologic: She has seasonal allergies and is treated with several medications.  ALLERGIES: No Known Allergies  HOME MEDICATIONS: Outpatient Prescriptions Prior to Visit  Medication Sig Dispense Refill  . ALPRAZolam (XANAX) 0.5 MG tablet Take 1 tablet (0.5 mg total) by mouth 2 (two) times daily. 60 tablet 3  . Cetirizine HCl (ZYRTEC ALLERGY) 10 MG  CAPS Take 10 mg by mouth daily.    . cholecalciferol (VITAMIN D) 1000 UNITS tablet Take 2,000 Units by mouth daily.     Marland Kitchen levothyroxine (SYNTHROID, LEVOTHROID) 25 MCG tablet Take 25 mcg by mouth daily before breakfast.    . methylphenidate (CONCERTA) 18 MG CR tablet Take 1  tablet (18 mg total) by mouth every morning. 30 tablet 0  . montelukast (SINGULAIR) 10 MG tablet Take 10 mg by mouth at bedtime.    . Multiple Vitamins-Minerals (MULTIVITAMIN PO) Take by mouth.    . Natalizumab (TYSABRI IV) Inject into the vein every 30 (thirty) days.    . NORETHINDRONE PO Take by mouth daily.    Marland Kitchen buPROPion (WELLBUTRIN XL) 150 MG 24 hr tablet Take 150 mg by mouth daily.    Marland Kitchen buPROPion (WELLBUTRIN) 100 MG tablet Take 100 mg by mouth 2 (two) times daily.     No facility-administered medications prior to visit.    PAST MEDICAL HISTORY: Past Medical History  Diagnosis Date  . Thyroid disease   . Syncope   . Multiple sclerosis   . Tremor     PAST SURGICAL HISTORY: Past Surgical History  Procedure Laterality Date  . Knee arthroscopy w/ acl reconstruction Left 2010  . Bunionectomy    . Foot surgery      FAMILY HISTORY: Family History  Problem Relation Age of Onset  . Prostate cancer Father   . Diabetes Father   . Arthritis    . Dementia    . Hypertension    . Heart disease      SOCIAL HISTORY:  History   Social History  . Marital Status: Single    Spouse Name: N/A    Number of Children: o  . Years of Education: N/A   Occupational History  . Not on file.   Social History Main Topics  . Smoking status: Never Smoker   . Smokeless tobacco: Never Used  . Alcohol Use: Yes     Comment: minimal weekly   . Drug Use: No  . Sexual Activity: Not on file   Other Topics Concern  . Not on file   Social History Narrative   Patient lives with parents.   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    Patient has a Probation officer.      PHYSICAL EXAM  Filed Vitals:   03/09/14 1455  BP: 130/90  Pulse: 64  Resp: 12  Height:  (1.575 m)  Weight: 161 lb (73.029 kg)    Body mass index is 29.44 kg/(m^2).   General: The patient is well-developed and well-nourished and in no acute  distress  Eyes:  Funduscopic exam shows normal optic discs and retinal vessels.  Neck: The neck is supple, no carotid bruits are noted.  The neck is nontender.  Respiratory: The respiratory examination is clear.  Cardiovascular: The cardiovascular examination reveals a regular rate and rhythm, no murmurs, gallops or rubs are noted.  Skin: Extremities are without significant edema.  Neurologic Exam  Mental status: The patient is alert and oriented x 3 at the time of the examination. The patient has apparent normal recent and remote memory, with an apparently normal attention span and concentration ability.   Speech is normal.  Cranial nerves: Extraocular movements are full. Pupils are equal, round, and reactive to light and accomodation.  Visual fields are full.  Facial symmetry is present. There is good facial sensation to  soft touch bilaterally.Facial strength is normal.  Trapezius and sternocleidomastoid strength is normal. No dysarthria is noted.  The tongue is midline, and the patient has symmetric elevation of the soft palate. No obvious hearing deficits are noted.  Motor:  Muscle bulk and tone are normal. Strength is  5 / 5 in all 4 extremities.   Sensory: Sensory testing is intact to pinprick, soft touch, vibration sensation, and position sense on all 4 extremities.  Coordination: Cerebellar testing reveals good finger-nose-finger and heel-to-shin bilaterally.  Gait and station: Station and gait are normal. Tandem gait is normal. Romberg is negative.   Reflexes: Deep tendon reflexes are symmetric and normal bilaterally. Plantar responses are normal.    DIAGNOSTIC DATA (LABS, IMAGING, TESTING) - I reviewed patient records, labs, notes, testing and imaging myself where available.  No results found for: WBC, HGB, HCT, MCV, PLT    Component Value Date/Time   PROT 6.4 11/21/2012 1438   AST 11 11/21/2012 1438   ALT 8 11/21/2012 1438   ALKPHOS 41 11/21/2012 1438   BILITOT 0.4  11/21/2012 1438        ASSESSMENT AND PLAN  34 y.o. female here with relapsing remitting multiple sclerosis. She has done relatively well on Tysabri but is interested in considering an oral agent at this time. We discussed possible therapeutic options including Tecfidera and Gilenya.   She is most interested in Tecfidera and I had her sign a service request form. We will check her blood work form in if the labs are fine.  Last MRI was performed in late 2012. As she has been off medicine for a while, I think it is important to get a new baseline and to determine the extent of subclinical progression, if present. He would prefer to do this at [facility] as she works there.  We discussed taking 2000-5000 units of vitamin D supplementation. She will return to see [place] in about 4 months. In the interim, if she notes any new or worsening neurologic symptoms she will give [place] a call.  50 minute face-to-face office visit with greater than one half of the time spent counseling and coordinating care for her multiple sclerosis.Pearletha Furl. Epimenio Foot, MD, PhD 03/09/2014, 3:22 PM Certified in Neurology, Clinical Neurophysiology, Sleep Medicine, Pain Medicine and Neuroimaging  [facility] [address] [place], [place] [address] [phone]

## 2014-03-10 LAB — CBC WITH DIFFERENTIAL/PLATELET
Basophils Absolute: 0 10*3/uL (ref 0.0–0.2)
Basos: 0 %
Eos: 2 %
Eosinophils Absolute: 0.1 10*3/uL (ref 0.0–0.4)
HCT: 38.2 % (ref 34.0–46.6)
HEMOGLOBIN: 13 g/dL (ref 11.1–15.9)
Immature Grans (Abs): 0 10*3/uL (ref 0.0–0.1)
Immature Granulocytes: 0 %
Lymphocytes Absolute: 2.2 10*3/uL (ref 0.7–3.1)
Lymphs: 32 %
MCH: 29.6 pg (ref 26.6–33.0)
MCHC: 34 g/dL (ref 31.5–35.7)
MCV: 87 fL (ref 79–97)
MONOS ABS: 0.7 10*3/uL (ref 0.1–0.9)
Monocytes: 11 %
NEUTROS ABS: 3.8 10*3/uL (ref 1.4–7.0)
Neutrophils Relative %: 55 %
RBC: 4.39 x10E6/uL (ref 3.77–5.28)
RDW: 13.2 % (ref 12.3–15.4)
WBC: 6.9 10*3/uL (ref 3.4–10.8)

## 2014-03-21 ENCOUNTER — Telehealth: Payer: Self-pay

## 2014-03-21 NOTE — Telephone Encounter (Signed)
[employer] notified us they have approved the request for coverage on Tecfidera effective until 03/21/2015 Ref # [id number]

## 2014-06-19 NOTE — Op Note (Signed)
PATIENT NAME:  Carrie Nelson, Carrie Nelson MR#:  [id number] DATE OF BIRTH:  08/03/1980  DATE OF PROCEDURE:  01/22/2014  PREOPERATIVE DIAGNOSES:  1.  37.1 week intrauterine pregnancy, undelivered.  2.  Advanced maternal age.  3.  Hypothyroidism. 4.  Multiple sclerosis.  5.  Symptomatic hydramnios.   POSTOPERATIVE DIAGNOSES:  1.  37.1 week intrauterine pregnancy, delivered.  2.  Advanced maternal age.  3.  Hypothyroidism. 4.  Multiple sclerosis.  5.  Symptomatic hydramnios. 6.  Viable female infant, 6 pounds 11 ounces.   OPERATIVE PROCEDURE: Primary low cervical transverse cesarean section.   SURGEON: Prentice Docker. Machaela Caterino, M.D.   FIRST ASSISTANT: Yolanda Bonine, certified nurse midwife.   ANESTHESIA: General endotracheal.   INDICATIONS: The patient is a 34 year old married white female gravida 1, para 0 at 37.[redacted] weeks gestation with symptomatic clinical hydramnios. The patient had AFI of 31 cm and developed abnormal fetal heart rate tracing during antepartum testing. Variable decelerations and what appeared to be late decelerations were noted. Subsequent amniotomy with amniotome and slow leak of the fluids to stabilize the vertex was performed. This was followed by [facility] and IUPC placement and Pitocin augmentation. During the attempted augmentation of labor, the patient developed intermittent and variable decelerations, and an episode of bradycardia. Because of the nonreassuring fetal heart rate tracing and the remoteness from vaginal delivery as the patient was 5 cm, 90%, and -2 station, decision was made to proceed with cesarean section delivery.   FINDINGS AT SURGERY: 6 pound 11 ounce viable female infant with Apgars of 8 and 9 at 1 and 5 minutes, respectively. Uterus, tubes, and ovaries were grossly normal.   DESCRIPTION OF THE PROCEDURE: The patient was brought to the operating room where she was placed in the supine position with a right lateral hip roll in place. A Foley catheter was draining clear  yellow urine from the bladder. A ChloraPrep abdominal prep and drape was performed in standard fashion. Following rapid sequence general endotracheal anesthesia induction, a Pfannenstiel incision was made in the abdomen. The fascia was incised transversely and extended bilaterally with Mayo scissors. The middle raphe was incised, separated, and the peritoneum was entered. Bladder flap was created over lower uterine segment with sharp dissection. A low transverse incision was made in the uterus and this was extended both cephalad and caudad in standard fashion. The infant was delivered through a vertex presentation and was noted to be vigorous at birth. The umbilical cord was doubly clamped and cut, and the infant was handed off to waiting resuscitation team. The placenta was expressed from the uterine cavity. The uterus was externalized onto the anterior abdominal wall.  It was cleared of all debris with laps. The incision was closed in 1 layer using #1 chromic suture in a running locking manner. With adequate hemostasis noted, the uterus was placed back into the abdominopelvic cavity. Gutters were cleared of all debris with laps. The incision was closed in layers with 0 Maxon being used in the fascia in a simple running manner. The subcutaneous tissues were reapproximated using 2-0 Vicryl sutures in a simple interrupted manner. The skin was closed with a subcuticular stitch of 4-0 Vicryl. Dermabond glue was placed over the incision. Pressure dressing was applied. The patient was then awakened, mobilized, and taken to the recovery room in satisfactory condition.   ESTIMATED BLOOD LOSS: 750 mL.   IV FLUIDS: 1200 mL of crystalloid.   URINE OUTPUT: 275 mL.   COUNTS: All instruments, needle, and sponge counts were  verified as correct. The patient received Ancef antibiotic prophylaxis.   ADDENDUM NOTE: Please note that the patient was group B strep positive and she did receive ampicillin antibiotic prophylaxis  during labor.   ____________________________ Prentice Docker Jahlisa Rossitto, MD mad:am D: 01/22/2014 23:25:07 ET T: 01/23/2014 03:10:04 ET JOB#: [id number]  cc: Daphine Deutscher A. Celise Bazar, MD, <Dictator> Prentice Docker Henley Blyth MD ELECTRONICALLY SIGNED 02/12/2014 10:54

## 2014-07-06 NOTE — H&P (Signed)
L&D Evaluation:  History:  HPI 34yo MWF presents at [redacted]w[redacted]d for NST secondary to polyhydramnious, G1P0000, pregancy also complicated by MS, Hypothyriodism, and AMA. last U/S in office 3 days ago reveals AFI 30.8cm with reactive NST. Admitting for fetal deceleration on NST today,   Patient's Medical History Thyroid Disease  morphine sulfate   Patient's Surgical History other  foot surgery 2005, Knee surgery 1993   Medications Pre Natal Vitamins  Tylenol (Acetaminophen)  Prilosec; Metformin; Synthriod, singulair, Vitamin D   Allergies NKDA   Social History none   Family History Non-Contributory   ROS:  ROS All systems were reviewed.  HEENT, CNS, GI, GU, Respiratory, CV, Renal and Musculoskeletal systems were found to be normal.   Exam:  Vital Signs stable   General no apparent distress   Mental Status clear   Chest clear   Heart normal sinus rhythm   Abdomen gravid, non-tender   Estimated Fetal Weight Average for gestational age   Fetal Position vtx   Back no CVAT   Edema 1+   Clonus negative   Pelvic 4/50/blottable   Mebranes Intact   FHT 140s baseline with spontaneous decels down to 120s with FM   FHT Description Variable decelerations   Ucx irregular   Ucx Pain Scale 0   Impression:  Impression Polyhydramnious with non-reassuring FHR   Plan:  Plan EFM/NST, antibiotics for GBBS prophylaxis, admitt for delivery   Comments MAD aware and here to review strip, agrees with admission and amniotomy and trial of labor vs primary c/s- will discuss with patient.   Electronic Signatures: Ulyses Amor (CNM)  (Signed [phone] 16:04)  Authored: L&D Evaluation   Last Updated: 27-Nov-15 16:04 by Ulyses Amor (CNM)

## 2014-07-08 ENCOUNTER — Ambulatory Visit (INDEPENDENT_AMBULATORY_CARE_PROVIDER_SITE_OTHER): Payer: 59 | Admitting: Neurology

## 2014-07-08 ENCOUNTER — Encounter: Payer: Self-pay | Admitting: Neurology

## 2014-07-08 VITALS — BP 110/76 | HR 88 | Resp 14 | Ht 62.0 in | Wt 162.0 lb

## 2014-07-08 DIAGNOSIS — R269 Unspecified abnormalities of gait and mobility: Secondary | ICD-10-CM

## 2014-07-08 DIAGNOSIS — R5383 Other fatigue: Secondary | ICD-10-CM

## 2014-07-08 DIAGNOSIS — F909 Attention-deficit hyperactivity disorder, unspecified type: Secondary | ICD-10-CM

## 2014-07-08 DIAGNOSIS — F988 Other specified behavioral and emotional disorders with onset usually occurring in childhood and adolescence: Secondary | ICD-10-CM | POA: Insufficient documentation

## 2014-07-08 DIAGNOSIS — G35 Multiple sclerosis: Secondary | ICD-10-CM | POA: Diagnosis not present

## 2014-07-08 DIAGNOSIS — R209 Unspecified disturbances of skin sensation: Secondary | ICD-10-CM

## 2014-07-08 MED ORDER — METHYLPHENIDATE HCL ER (OSM) 18 MG PO TBCR: EXTENDED_RELEASE_TABLET | ORAL | Status: DC

## 2014-07-08 NOTE — Progress Notes (Signed)
[facility]  PATIENT: Carrie Nelson DOB: 04-Mar-1980  REFERRING CLINICIAN: Daniel Nones HISTORY FROM: Patient   REASON FOR VISIT: Multiple Sclerosis   HISTORICAL  CHIEF COMPLAINT:  Chief Complaint  Patient presents with  . Multiple Sclerosis    Sts. she tolerates Tecfidera with only mild hot flashes--but sts. they are getting better.Denies new or worsening sx/fim    HISTORY OF PRESENT ILLNESS:  Carrie Nelson is a 34 year old woman who was diagnosed with MS in 2007.  At her last visit we reinitiated DMT after she stopped due to pregnancy.  She is tolerating Tecfidera fairly well with some flushing, helped by taking after a meal and taking aspirin   MS History:   She was diagnosed in 2007 but,  in retrospect, had right foot dragging that started around 2005. Then shortly after that she had an episode of left facial numbness. In 2007 she had an episode of slurred speech and worsening clumsiness.  MRI of the brain that was consistent with multiple sclerosis. She also had a lumbar puncture and her CSF compatible with the diagnosis of MS. Most of her symptoms improved over the next few months but she did not get 100% back to baseline and she continues to have minimal residual weakness, clumsiness and numbness. She was initially placed on Betaseron but switched to Tysabri because of some generalized side effects of Betaseron. She was seeing Dr. Tinnie Gens 2007 until 2014.   She was on Tysabri between 2008 and late 2014 when she stopped because of a desire to get pregnant. She became pregnant in early 2015 and delivered in November 2015. She had not been on any disease modifying therapy since late 2014 and then started Tecfidera 02/2014.      Gait/strength/sensation:    She notes that the right leg is dragging at time especially if tired.    There is mild right leg weakness, noticeable with stair climbing.   She has mild right sided sensation changes that are not  bothersome.    Bladder function is fine.   Vision is fine.     Fatigue/sleep/cognition:     She has some fatigue worse since delivery but not changed by Tecfidera. She notices decreased focus but does not have any significant memory issues or other cognitive concerns.   Mood/cognition:   She denies any depression or anxiety.    She has had some difficulty with attention and focus. This has been helped more by Concerta than with IR Ritalin. She tolerates it well.   REVIEW OF SYSTEMS:  Constitutional: No fevers, chills, sweats, or change in appetite Eyes: No visual changes, double vision, eye pain Ear, nose and throat: No hearing loss, ear pain, nasal congestion, sore throat Cardiovascular: No chest pain, palpitations Respiratory:  No shortness of breath at rest or with exertion.   No wheezes GastrointestinaI: No nausea, vomiting, diarrhea, abdominal pain, fecal incontinence Genitourinary:  No dysuria, urinary retention or frequency.  No nocturia. Musculoskeletal:  No neck pain, back pain Integumentary: No rash, pruritus, skin lesions Neurological: as above Psychiatric: No depression at this time.  No anxiety.   Occasionally she has irritability. Endocrine: She she is borderline diabetic. No palpitations, diaphoresis, change in appetite, change in weigh or increased thirst Hematologic/Lymphatic:  No anemia, purpura, petechiae. Allergic/Immunologic: She has seasonal allergies and is treated with several medications.  ALLERGIES: No Known Allergies  HOME MEDICATIONS: Outpatient Prescriptions Prior to Visit  Medication Sig Dispense Refill  . Azelastine-Fluticasone (DYMISTA) 137-50 MCG/ACT SUSP  1 spray by Nasal route 2 (two) times daily.    . Cetirizine HCl (ZYRTEC ALLERGY) 10 MG CAPS Take 10 mg by mouth daily.    . cholecalciferol (VITAMIN D) 1000 UNITS tablet Take 2,000 Units by mouth daily.     . fexofenadine (ALLEGRA) 180 MG tablet Take by mouth.    . levothyroxine (SYNTHROID,  LEVOTHROID) 25 MCG tablet Take 25 mcg by mouth daily before breakfast.    . metFORMIN (GLUCOPHAGE) 500 MG tablet Take by mouth.    . metFORMIN (GLUCOPHAGE) 850 MG tablet Take by mouth.    . methylphenidate (CONCERTA) 18 MG PO CR tablet One or two every morning 60 tablet 0  . montelukast (SINGULAIR) 10 MG tablet Take 10 mg by mouth at bedtime.    . montelukast (SINGULAIR) 10 MG tablet Take by mouth.    . Multiple Vitamins-Minerals (MULTIVITAMIN PO) Take by mouth.    . NORETHINDRONE PO Take by mouth daily.    Marland Kitchen ALPRAZolam (XANAX) 0.5 MG tablet Take 1 tablet (0.5 mg total) by mouth 2 (two) times daily. 60 tablet 3  . levothyroxine (SYNTHROID) 25 MCG tablet Take 25 mcg by mouth.    . levothyroxine (SYNTHROID, LEVOTHROID) 50 MCG tablet Take by mouth.    . levothyroxine (SYNTHROID, LEVOTHROID) 50 MCG tablet   5  . metFORMIN (GLUCOPHAGE) 850 MG tablet   4  . methylphenidate (CONCERTA) 18 MG CR tablet Take 1 tablet (18 mg total) by mouth every morning. 30 tablet 0  . Multiple Vitamin (MULTI-VITAMINS) TABS Take by mouth.    . Natalizumab (TYSABRI IV) Inject into the vein every 30 (thirty) days.     No facility-administered medications prior to visit.    PAST MEDICAL HISTORY: Past Medical History  Diagnosis Date  . Thyroid disease   . Syncope   . Multiple sclerosis   . Tremor     PAST SURGICAL HISTORY: Past Surgical History  Procedure Laterality Date  . Knee arthroscopy w/ acl reconstruction Left 2010  . Bunionectomy    . Foot surgery      FAMILY HISTORY: Family History  Problem Relation Age of Onset  . Prostate cancer Father   . Diabetes Father   . Arthritis    . Dementia    . Hypertension    . Heart disease      SOCIAL HISTORY:  History   Social History  . Marital Status: Single    Spouse Name: N/A  . Number of Children: o  . Years of Education: N/A   Occupational History  . Not on file.   Social History Main Topics  . Smoking status: Never Smoker   . Smokeless  tobacco: Never Used  . Alcohol Use: Yes     Comment: minimal weekly   . Drug Use: No  . Sexual Activity: Not on file   Other Topics Concern  . Not on file   Social History Narrative   Patient lives with parents.   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    Patient has a Probation officer.      PHYSICAL EXAM  Filed Vitals:   07/08/14 1303  BP: 110/76  Pulse: 88  Resp: 14  Height:  (1.575 m)  Weight: 162 lb (73.483 kg)    Body mass index is 29.62 kg/(m^2).   General: The patient is well-developed and well-nourished and in no acute distress  Neck: The neck is supple, no carotid  bruits are noted.  The neck is nontender.  Skin: Extremities are without significant edema.  Neurologic Exam  Mental status: The patient is alert and oriented x 3 at the time of the examination. The patient has apparent normal recent and remote memory, with an apparently normal attention span and concentration ability.   Speech is normal.  Cranial nerves: Extraocular movements are full.   Facial symmetry is present. There is good facial sensation to soft touch bilaterally.Facial strength is normal.  Trapezius and sternocleidomastoid strength is normal. No dysarthria is noted.  The tongue is midline, and the patient has symmetric elevation of the soft palate. No obvious hearing deficits are noted.  Motor:  Muscle bulk and tone are normal. Strength is  5 / 5 in all 4 extremities.   Sensory: Sensory testing is intact to pinprick, soft touch, vibration sensation, and position sense on all 4 extremities.  Coordination: Cerebellar testing reveals good finger-nose-finger and heel-to-shin bilaterally.  Gait and station: Station and gait are normal. Tandem gait is very minimally wide. Romberg is negative.   Reflexes: Deep tendon reflexes are symmetric and normal bilaterally.    DIAGNOSTIC DATA (LABS, IMAGING, TESTING) - I reviewed  patient records, labs, notes, testing and imaging myself where available.  Lab Results  Component Value Date   WBC 6.9 03/09/2014   HGB 13.0 03/09/2014   HCT 38.2 03/09/2014   MCV 87 03/09/2014   PLT 398 01/22/2014      Component Value Date/Time   NA 138 05/09/2012 0830   K 3.7 05/09/2012 0830   CL 108* 05/09/2012 0830   CO2 23 05/09/2012 0830   GLUCOSE 84 05/09/2012 0830   BUN 16 05/09/2012 0830   CREATININE 0.95 05/09/2012 0830   CALCIUM 8.6 05/09/2012 0830   PROT 6.4 11/21/2012 1438   PROT 6.7 05/09/2012 0830   ALBUMIN 3.4 05/09/2012 0830   AST 11 11/21/2012 1438   AST 17 05/09/2012 0830   ALT 8 11/21/2012 1438   ALT 16 05/09/2012 0830   ALKPHOS 41 11/21/2012 1438   ALKPHOS 55 05/09/2012 0830   BILITOT 0.4 11/21/2012 1438   GFRNONAA >60 05/09/2012 0830   GFRAA >60 05/09/2012 0830        ASSESSMENT AND PLAN  Multiple sclerosis - Plan: CBC with Differential/Platelet, MR Brain W Wo Contrast  Disturbance of skin sensation - Plan: MR Brain W Wo Contrast  Gait disturbance - Plan: MR Brain W Wo Contrast  Other fatigue  Attention deficit disorder  1.  Continue Tecfidera. I will check a CBC with differential to make sure she does not have severe lymphopenia. We will check an MRI of the brain to make sure that she is not showing subclinical progression and to have a new baseline while on Tecfidera. 2.  She should remain active and exercises as tolerated. 3.   Continue OTC vitamin D 4.   Return in 6 months or call sooner if new or worsening neurologic symptoms.   Jadynn Epping A. Epimenio Foot, MD, PhD 07/08/2014, 1:24 PM Certified in Neurology, Clinical Neurophysiology, Sleep Medicine, Pain Medicine and Neuroimaging  [facility] [address] [place], [place] [address] [phone]

## 2014-07-09 LAB — CBC WITH DIFFERENTIAL/PLATELET
BASOS ABS: 0.1 10*3/uL (ref 0.0–0.2)
Basos: 1 %
EOS (ABSOLUTE): 0.1 10*3/uL (ref 0.0–0.4)
EOS: 1 %
Hematocrit: 41 % (ref 34.0–46.6)
Hemoglobin: 13.8 g/dL (ref 11.1–15.9)
IMMATURE GRANS (ABS): 0 10*3/uL (ref 0.0–0.1)
Immature Granulocytes: 0 %
LYMPHS ABS: 1.9 10*3/uL (ref 0.7–3.1)
Lymphs: 30 %
MCH: 30.7 pg (ref 26.6–33.0)
MCHC: 33.7 g/dL (ref 31.5–35.7)
MCV: 91 fL (ref 79–97)
Monocytes Absolute: 0.7 10*3/uL (ref 0.1–0.9)
Monocytes: 10 %
NEUTROS PCT: 58 %
Neutrophils Absolute: 3.7 10*3/uL (ref 1.4–7.0)
Platelets: 377 10*3/uL (ref 150–379)
RBC: 4.49 x10E6/uL (ref 3.77–5.28)
RDW: 13.2 % (ref 12.3–15.4)
WBC: 6.4 10*3/uL (ref 3.4–10.8)

## 2014-07-13 ENCOUNTER — Telehealth: Payer: Self-pay

## 2014-07-13 ENCOUNTER — Telehealth: Payer: Self-pay | Admitting: *Deleted

## 2014-07-13 NOTE — Telephone Encounter (Signed)
VM left for patient to call office back to receive normal lab results

## 2014-07-13 NOTE — Telephone Encounter (Signed)
-----   Message from Asa Lente, MD sent at 07/12/2014  6:42 PM EDT ----- Please let her know labs are good

## 2014-07-13 NOTE — Telephone Encounter (Signed)
I have spoken with Carrie Nelson this afternoon and advised that per RAS, cbc was normal.  She verbalized understanding of same/fim

## 2014-07-13 NOTE — Telephone Encounter (Signed)
Patient called/returning Carrie Nelson's call regarding her results. Please call and advise. Patient can be reached @ [phone]

## 2014-07-13 NOTE — Telephone Encounter (Signed)
Patient was given labs results which were normal

## 2014-07-19 ENCOUNTER — Ambulatory Visit: Payer: Self-pay

## 2014-07-23 ENCOUNTER — Ambulatory Visit
Admission: RE | Admit: 2014-07-23 | Discharge: 2014-07-23 | Disposition: A | Discharge status: Home / Self Care | Payer: 59 | Source: Ambulatory Visit | Attending: Neurology | Admitting: Neurology

## 2014-07-23 DIAGNOSIS — R269 Unspecified abnormalities of gait and mobility: Secondary | ICD-10-CM | POA: Diagnosis present

## 2014-07-23 DIAGNOSIS — R209 Unspecified disturbances of skin sensation: Secondary | ICD-10-CM | POA: Diagnosis present

## 2014-07-23 DIAGNOSIS — G35 Multiple sclerosis: Secondary | ICD-10-CM | POA: Diagnosis not present

## 2014-07-23 MED ORDER — GADOBENATE DIMEGLUMINE 529 MG/ML IV SOLN
15.0000 mL | Freq: Once | INTRAVENOUS | Status: AC | PRN
  Administered 2014-07-23: 15 mL via INTRAVENOUS

## 2014-07-27 ENCOUNTER — Telehealth: Payer: Self-pay

## 2014-07-27 NOTE — Telephone Encounter (Signed)
Patient was informed that Scan was normal with no definite changes from last scan.  She has no questions at this time.

## 2014-08-16 ENCOUNTER — Encounter: Payer: Self-pay | Admitting: *Deleted

## 2014-08-17 ENCOUNTER — Ambulatory Visit (INDEPENDENT_AMBULATORY_CARE_PROVIDER_SITE_OTHER): Payer: 59

## 2014-08-17 VITALS — BP 122/79 | HR 85 | Ht 62.0 in | Wt 156.6 lb

## 2014-08-17 DIAGNOSIS — E669 Obesity, unspecified: Secondary | ICD-10-CM | POA: Diagnosis not present

## 2014-08-17 MED ORDER — CYANOCOBALAMIN 1000 MCG/ML IJ SOLN
1000.0000 ug | Freq: Once | INTRAMUSCULAR | Status: AC
  Administered 2014-08-17: 1000 ug via INTRAMUSCULAR

## 2014-08-17 NOTE — Progress Notes (Signed)
Patient ID: Carrie Nelson, female   DOB: 1980/12/10, 34 y.o.   MRN: [id number]  Pt presents for wt,bp, and b12 inj. NO s/e.

## 2014-09-01 ENCOUNTER — Other Ambulatory Visit: Payer: Self-pay | Admitting: Neurology

## 2014-09-01 MED ORDER — METHYLPHENIDATE HCL ER (OSM) 18 MG PO TBCR: EXTENDED_RELEASE_TABLET | ORAL | Status: DC

## 2014-09-01 NOTE — Telephone Encounter (Signed)
Patient called and requested a refill on Rx. methylphenidate (CONCERTA) 18 MG PO CR tablet. Advised her it would be ready within 24 hours unless notified otherwise by the nurse.

## 2014-09-01 NOTE — Telephone Encounter (Signed)
Request entered, forwarded to provider for approval.  

## 2014-09-02 ENCOUNTER — Telehealth: Payer: Self-pay | Admitting: Neurology

## 2014-09-02 ENCOUNTER — Encounter: Payer: Self-pay | Admitting: *Deleted

## 2014-09-02 NOTE — Progress Notes (Signed)
Ritalin rx. up front [facility]/fim 

## 2014-09-02 NOTE — Telephone Encounter (Signed)
Patient called and stated that the [facility] Concierge service would be coming to pick up her medication, someone by the name of Misty Stanley. She wanted to verify that it was ok for Misty Stanley to get her medication. No need to return call.

## 2014-09-17 ENCOUNTER — Encounter: Payer: Self-pay | Admitting: Obstetrics and Gynecology

## 2014-09-17 ENCOUNTER — Ambulatory Visit (INDEPENDENT_AMBULATORY_CARE_PROVIDER_SITE_OTHER): Payer: 59 | Admitting: Obstetrics and Gynecology

## 2014-09-17 VITALS — BP 118/80 | HR 82 | Ht 62.0 in | Wt 157.2 lb

## 2014-09-17 DIAGNOSIS — E663 Overweight: Secondary | ICD-10-CM | POA: Diagnosis not present

## 2014-09-17 DIAGNOSIS — F419 Anxiety disorder, unspecified: Secondary | ICD-10-CM | POA: Diagnosis not present

## 2014-09-17 MED ORDER — NITROFURANTOIN MONOHYD MACRO 100 MG PO CAPS: 100.0000 mg | ORAL_CAPSULE | Freq: Every day | ORAL | Status: DC

## 2014-09-17 MED ORDER — CYANOCOBALAMIN 1000 MCG/ML IJ SOLN
1000.0000 ug | Freq: Once | INTRAMUSCULAR | Status: AC
  Administered 2014-09-17: 1000 ug via INTRAMUSCULAR

## 2014-09-17 MED ORDER — PHENTERMINE HCL 37.5 MG PO TABS: 37.5000 mg | ORAL_TABLET | Freq: Every day | ORAL | Status: DC

## 2014-09-17 MED ORDER — CYANOCOBALAMIN 1000 MCG/ML IJ SOLN: 1000.0000 ug | Freq: Once | INTRAMUSCULAR | Status: DC

## 2014-09-17 MED ORDER — FLUOXETINE HCL 10 MG PO CAPS: 10.0000 mg | ORAL_CAPSULE | Freq: Every day | ORAL | Status: DC

## 2014-09-17 NOTE — Progress Notes (Signed)
Subjective:     Patient ID: Carrie Nelson, female   DOB: Oct 18, 1980, 34 y.o.   MRN: [id number]  HPI Currently finishing 3rd month of weight loss program, desires continuing to get BMI <25  Review of Systems Anxiety has returned with increased work stressors, desires to restart an anxiety medication, not interested in wellbutrin as she doesn't feel like it helped last time.    Objective:   Physical Exam A&OI x4 Well groomed female    Assessment:     Overweight- on weight loss management plan anxiety     Plan:     Refilled medications and after 2 weeks off meds she is to restart. rx for prozac 10mg  sent in, to take at bedtime.   RTC 6 weeks or prn  Yolanda Bonine, CNM

## 2014-10-28 ENCOUNTER — Ambulatory Visit: Payer: 59 | Admitting: Obstetrics and Gynecology

## 2014-11-03 ENCOUNTER — Ambulatory Visit (INDEPENDENT_AMBULATORY_CARE_PROVIDER_SITE_OTHER): Payer: 59 | Admitting: Obstetrics and Gynecology

## 2014-11-03 ENCOUNTER — Other Ambulatory Visit: Payer: Self-pay | Admitting: Orthopedic Surgery

## 2014-11-03 VITALS — BP 116/80 | HR 85 | Ht 62.0 in | Wt 159.1 lb

## 2014-11-03 DIAGNOSIS — E669 Obesity, unspecified: Secondary | ICD-10-CM | POA: Diagnosis not present

## 2014-11-03 DIAGNOSIS — M25562 Pain in left knee: Secondary | ICD-10-CM

## 2014-11-03 MED ORDER — CYANOCOBALAMIN 1000 MCG/ML IJ SOLN
1000.0000 ug | Freq: Once | INTRAMUSCULAR | Status: AC
  Administered 2014-11-03: 1000 ug via INTRAMUSCULAR

## 2014-11-03 NOTE — Progress Notes (Cosign Needed)
Patient ID: Carrie Nelson, female   DOB: March 02, 1980, 34 y.o.   MRN: [id number] Pt presents for weight, B/P, B-12 injection. No side effects of medication-Phentermine, or B-12.  Weight gain of  2  lbs. Encouraged eating healthy and exercise. Pt states she wasn't dedicated this past month

## 2014-11-04 ENCOUNTER — Encounter: Payer: Self-pay | Admitting: *Deleted

## 2014-11-04 ENCOUNTER — Other Ambulatory Visit: Payer: Self-pay | Admitting: Neurology

## 2014-11-04 MED ORDER — METHYLPHENIDATE HCL ER (OSM) 18 MG PO TBCR: EXTENDED_RELEASE_TABLET | ORAL | Status: DC

## 2014-11-04 NOTE — Telephone Encounter (Signed)
Request entered, forwarded to provider for approval.  

## 2014-11-04 NOTE — Progress Notes (Signed)
Concerta rx. up front [facility]/fim

## 2014-11-04 NOTE — Telephone Encounter (Signed)
Patient called to request refill on methylphenidate (CONCERTA) 18 MG PO CR tablet

## 2014-11-10 ENCOUNTER — Ambulatory Visit
Admission: RE | Admit: 2014-11-10 | Discharge: 2014-11-10 | Disposition: A | Discharge status: Home / Self Care | Payer: 59 | Source: Ambulatory Visit | Attending: Neurology | Admitting: Neurology

## 2014-11-10 ENCOUNTER — Ambulatory Visit
Admission: RE | Admit: 2014-11-10 | Discharge: 2014-11-10 | Disposition: A | Discharge status: Home / Self Care | Payer: 59 | Source: Ambulatory Visit | Attending: Orthopedic Surgery | Admitting: Orthopedic Surgery

## 2014-11-10 DIAGNOSIS — M25462 Effusion, left knee: Secondary | ICD-10-CM | POA: Diagnosis not present

## 2014-11-10 DIAGNOSIS — X58XXXA Exposure to other specified factors, initial encounter: Secondary | ICD-10-CM | POA: Diagnosis not present

## 2014-11-10 DIAGNOSIS — Z9889 Other specified postprocedural states: Secondary | ICD-10-CM | POA: Diagnosis present

## 2014-11-10 DIAGNOSIS — S83242A Other tear of medial meniscus, current injury, left knee, initial encounter: Secondary | ICD-10-CM | POA: Diagnosis not present

## 2014-11-10 DIAGNOSIS — Z79899 Other long term (current) drug therapy: Secondary | ICD-10-CM

## 2014-11-10 DIAGNOSIS — G35 Multiple sclerosis: Secondary | ICD-10-CM

## 2014-11-10 DIAGNOSIS — M25562 Pain in left knee: Secondary | ICD-10-CM

## 2014-11-16 ENCOUNTER — Telehealth: Payer: Self-pay | Admitting: Neurology

## 2014-11-16 NOTE — Telephone Encounter (Signed)
Patient is calling as she feels she is having an MS relapse: tingling in toes, increased fatigue.  Please call.

## 2014-11-16 NOTE — Telephone Encounter (Signed)
After speaking with RAS, I called Carrie Nelson back, advised that since sx. are stable, RAS would like for her to monitor them, come in next week for f/u.  She is agreeable.  Appt. given for next Tues at 1pm.  She will call me back if sx.  resolve or worsen prior to appt/fim

## 2014-11-16 NOTE — Telephone Encounter (Signed)
I have spoken with Carrie Nelson this morning--she sts. since Sunday she has had tingling in the toes of her left foot only, and fatigue has been increased for several days.  Denies difficulty with gait/balance.  She denies missed doses of Tecfidera.  Sts. sx. have been same since onset on Sunday, not getting worse.  I will check with RAS and call her back with tx. options/fim

## 2014-11-23 ENCOUNTER — Ambulatory Visit: Payer: Self-pay | Admitting: Neurology

## 2014-12-03 ENCOUNTER — Ambulatory Visit: Payer: 59

## 2014-12-13 ENCOUNTER — Other Ambulatory Visit: Payer: Self-pay | Admitting: Neurology

## 2014-12-13 ENCOUNTER — Encounter: Payer: Self-pay | Admitting: *Deleted

## 2014-12-13 MED ORDER — METHYLPHENIDATE HCL ER (OSM) 18 MG PO TBCR: EXTENDED_RELEASE_TABLET | ORAL | Status: DC

## 2014-12-13 NOTE — Telephone Encounter (Signed)
Request entered, forwarded to provider for approval.  

## 2014-12-13 NOTE — Telephone Encounter (Signed)
Patient called to request refill of methylphenidate (CONCERTA) 18 MG PO CR tablet. Patient would like to get more than a 30 day Rx.

## 2014-12-13 NOTE — Progress Notes (Unsigned)
Ritalin rx. up front [facility]/fim 

## 2015-01-10 ENCOUNTER — Other Ambulatory Visit: Payer: Self-pay | Admitting: Obstetrics and Gynecology

## 2015-01-11 ENCOUNTER — Ambulatory Visit: Payer: 59 | Admitting: Neurology

## 2015-01-18 ENCOUNTER — Encounter: Payer: Self-pay | Admitting: *Deleted

## 2015-01-18 ENCOUNTER — Other Ambulatory Visit: Payer: Self-pay | Admitting: Neurology

## 2015-01-18 ENCOUNTER — Ambulatory Visit: Payer: 59 | Admitting: Neurology

## 2015-01-18 MED ORDER — METHYLPHENIDATE HCL ER (OSM) 18 MG PO TBCR: EXTENDED_RELEASE_TABLET | ORAL | Status: DC

## 2015-01-18 NOTE — Telephone Encounter (Signed)
Patient is calling to order a written Rx methylphenidate 18 mg PO CR tablet.  Thanks!

## 2015-01-18 NOTE — Progress Notes (Signed)
Ritalin rx. up front [facility]/fim 

## 2015-01-18 NOTE — Telephone Encounter (Signed)
Request entered, forwarded to provider for approval.  

## 2015-01-24 ENCOUNTER — Other Ambulatory Visit: Payer: Self-pay | Admitting: Obstetrics and Gynecology

## 2015-01-26 ENCOUNTER — Ambulatory Visit: Payer: 59 | Admitting: Neurology

## 2015-01-31 ENCOUNTER — Encounter: Payer: Self-pay | Admitting: Neurology

## 2015-01-31 ENCOUNTER — Ambulatory Visit (INDEPENDENT_AMBULATORY_CARE_PROVIDER_SITE_OTHER): Payer: 59 | Admitting: Neurology

## 2015-01-31 VITALS — BP 130/88 | HR 78 | Resp 16 | Ht 62.0 in | Wt 159.0 lb

## 2015-01-31 DIAGNOSIS — G35 Multiple sclerosis: Secondary | ICD-10-CM | POA: Diagnosis not present

## 2015-01-31 DIAGNOSIS — F988 Other specified behavioral and emotional disorders with onset usually occurring in childhood and adolescence: Secondary | ICD-10-CM

## 2015-01-31 DIAGNOSIS — F909 Attention-deficit hyperactivity disorder, unspecified type: Secondary | ICD-10-CM | POA: Diagnosis not present

## 2015-01-31 DIAGNOSIS — Z8639 Personal history of other endocrine, nutritional and metabolic disease: Secondary | ICD-10-CM | POA: Insufficient documentation

## 2015-01-31 DIAGNOSIS — F419 Anxiety disorder, unspecified: Secondary | ICD-10-CM

## 2015-01-31 DIAGNOSIS — R339 Retention of urine, unspecified: Secondary | ICD-10-CM | POA: Diagnosis not present

## 2015-01-31 DIAGNOSIS — R590 Localized enlarged lymph nodes: Secondary | ICD-10-CM | POA: Insufficient documentation

## 2015-01-31 DIAGNOSIS — R5383 Other fatigue: Secondary | ICD-10-CM

## 2015-01-31 DIAGNOSIS — R269 Unspecified abnormalities of gait and mobility: Secondary | ICD-10-CM | POA: Diagnosis not present

## 2015-01-31 DIAGNOSIS — Z8739 Personal history of other diseases of the musculoskeletal system and connective tissue: Secondary | ICD-10-CM | POA: Insufficient documentation

## 2015-01-31 DIAGNOSIS — J302 Other seasonal allergic rhinitis: Secondary | ICD-10-CM | POA: Insufficient documentation

## 2015-01-31 DIAGNOSIS — J45909 Unspecified asthma, uncomplicated: Secondary | ICD-10-CM | POA: Insufficient documentation

## 2015-01-31 MED ORDER — METHYLPHENIDATE HCL ER (OSM) 36 MG PO TBCR: EXTENDED_RELEASE_TABLET | ORAL | Status: DC

## 2015-01-31 NOTE — Progress Notes (Signed)
[facility]  PATIENT: Carrie Nelson DOB: Jun 29, 1980  REFERRING CLINICIAN: Daniel Nones HISTORY FROM: Patient   REASON FOR VISIT: Multiple Sclerosis   HISTORICAL  CHIEF COMPLAINT:  Chief Complaint  Patient presents with  . Multiple Sclerosis    Sts. she continues to tolerate Tecfidera well. Denies new or worsening sx/fim    HISTORY OF PRESENT ILLNESS:  [name] is a 34 year old woman who was diagnosed with MS in 2007.  She restarted Tecfidera earlier this year after a pregnancy (son born in November 2015).  She is tolerating Tecfidera fairly well with some flushing, helped by taking after a meal and taking aspirin.   She had a mild sensory exacerbation with numbness in her feet x 10 days, similar to numbness she had with other exacerbations.     As symptoms were mild, she was not treated with steroids.     MS History:   She was diagnosed in 2007 but,  in retrospect, had right foot dragging that started around 2005. Then shortly after that she had an episode of left facial numbness. In 2007 she had an episode of slurred speech and worsening clumsiness.  MRI of the brain that was consistent with multiple sclerosis. She also had a lumbar puncture and her CSF compatible with the diagnosis of MS. Most of her symptoms improved over the next few months but she did not get 100% back to baseline and she continues to have minimal residual weakness, clumsiness and numbness. She was initially placed on Betaseron but switched to Tysabri because of some generalized side effects of Betaseron. She was seeing Dr. Tinnie Gens 2007 until 2014.   She was on Tysabri between 2008 and late 2014 when she stopped because of a desire to get pregnant. She became pregnant in early 2015 and delivered in November 2015. She had not been on any disease modifying therapy since late 2014 and then started Tecfidera 02/2014.      Gait/strength/sensation:    She notes that the right leg is dragging  at time especially if tired.    There is mild right leg weakness, noticeable with stair climbing.   She has mild right sided sensation changes that are not bothersome.    Bladder function is fine.     Vision is fine.     Fatigue/sleep/cognition:     She has worse fatigue this year.   She has tried Concerta 18 or 36 mg at the last visit.   36 mg works better and she takes at 10 am to get a better benefit in the afternoons.    She is less sleepy in the afternoons now.   She sleeps well at night now that her son sleeps through the night.     She notes that her decreased focus is also better.    She denies memory problems.     Mood:   She denies any depression.   She notes mild anxiety helped by Prozac 10 mg   REVIEW OF SYSTEMS:  Constitutional: No fevers, chills, sweats, or change in appetite Eyes: No visual changes, double vision, eye pain Ear, nose and throat: No hearing loss, ear pain, nasal congestion, sore throat Cardiovascular: No chest pain, palpitations Respiratory:  No shortness of breath at rest or with exertion.   No wheezes GastrointestinaI: No nausea, vomiting, diarrhea, abdominal pain, fecal incontinence Genitourinary:  No dysuria, urinary retention or frequency.  No nocturia. Musculoskeletal:  No neck pain, back pain Integumentary: No rash, pruritus, skin  lesions Neurological: as above Psychiatric: No depression at this time.  No anxiety.   Occasionally she has irritability. Endocrine: She she is borderline diabetic. No palpitations, diaphoresis, change in appetite, change in weigh or increased thirst Hematologic/Lymphatic:  No anemia, purpura, petechiae. Allergic/Immunologic: She has seasonal allergies and is treated with several medications.  ALLERGIES: No Known Allergies  HOME MEDICATIONS: Outpatient Prescriptions Prior to Visit  Medication Sig Dispense Refill  . Azelastine-Fluticasone (DYMISTA) 137-50 MCG/ACT SUSP 1 spray by Nasal route 2 (two) times daily.    .  Cetirizine HCl (ZYRTEC ALLERGY) 10 MG CAPS Take 10 mg by mouth daily.    . cholecalciferol (VITAMIN D) 1000 UNITS tablet Take 2,000 Units by mouth daily.     . Dimethyl Fumarate 240 MG CPDR Take 240 mg by mouth 2 (two) times daily.    Marland Kitchen FLUoxetine (PROZAC) 10 MG capsule TAKE 1 CAPSULE BY MOUTH ONCE DAILY 30 capsule 4  . fluticasone (FLONASE) 50 MCG/ACT nasal spray Place into the nose.    Marland Kitchen LARIN 1.5/30 1.5-30 MG-MCG tablet TAKE 1 TABLET BY MOUTH DAILY 63 tablet 4  . levothyroxine (SYNTHROID, LEVOTHROID) 25 MCG tablet Take 25 mcg by mouth daily before breakfast.    . metFORMIN (GLUCOPHAGE) 500 MG tablet Take by mouth.    . metFORMIN (GLUCOPHAGE) 850 MG tablet TAKE ONE TABLET BY MOUTH EARLY IN THE MORNING 90 tablet 4  . methylphenidate (CONCERTA) 18 MG PO CR tablet One or two every morning 60 tablet 0  . montelukast (SINGULAIR) 10 MG tablet Take 10 mg by mouth at bedtime.    . Multiple Vitamins-Minerals (MULTIVITAMIN PO) Take by mouth.    . cyanocobalamin (,VITAMIN B-12,) 1000 MCG/ML injection Inject 1 mL (1,000 mcg total) into the muscle once. (Patient not taking: Reported on 01/31/2015) 10 mL 1  . fexofenadine (ALLEGRA) 180 MG tablet Take by mouth.    . nitrofurantoin, macrocrystal-monohydrate, (MACROBID) 100 MG capsule Take 1 capsule (100 mg total) by mouth at bedtime. (Patient not taking: Reported on 01/31/2015) 30 capsule 1  . NORETHINDRONE PO Take by mouth daily.    . phentermine (ADIPEX-P) 37.5 MG tablet Take 1 tablet (37.5 mg total) by mouth daily before breakfast. (Patient not taking: Reported on 01/31/2015) 30 tablet 2   No facility-administered medications prior to visit.    PAST MEDICAL HISTORY: Past Medical History  Diagnosis Date  . Thyroid disease   . Syncope   . Multiple sclerosis (HCC)   . Tremor     PAST SURGICAL HISTORY: Past Surgical History  Procedure Laterality Date  . Knee arthroscopy w/ acl reconstruction Left 2010  . Bunionectomy    . Foot surgery      FAMILY  HISTORY: Family History  Problem Relation Age of Onset  . Prostate cancer Father   . Diabetes Father   . Arthritis    . Dementia    . Hypertension    . Heart disease      SOCIAL HISTORY:  Social History   Social History  . Marital Status: Single    Spouse Name: N/A  . Number of Children: o  . Years of Education: N/A   Occupational History  . Not on file.   Social History Main Topics  . Smoking status: Never Smoker   . Smokeless tobacco: Never Used  . Alcohol Use: Yes     Comment: minimal weekly   . Drug Use: No  . Sexual Activity: Yes    Birth Control/ Protection: Pill   Other  Topics Concern  . Not on file   Social History Narrative   Patient lives with parents.   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    Patient has a Probation officer.      PHYSICAL EXAM  Filed Vitals:   01/31/15 1342  BP: 130/88  Pulse: 78  Resp: 16  Height:  (1.575 m)  Weight: 159 lb (72.122 kg)    Body mass index is 29.07 kg/(m^2).   General: The patient is well-developed and well-nourished and in no acute distress   Neurologic Exam  Mental status: The patient is alert and oriented x 3 at the time of the examination. The patient has apparent normal recent and remote memory, with an apparently normal attention span and concentration ability.   Speech is normal.  Cranial nerves: Extraocular movements are full.   Facial symmetry is present. There is good facial sensation to soft touch bilaterally.Facial strength is normal.  Trapezius and sternocleidomastoid strength is normal. No dysarthria is noted.   No obvious hearing deficits are noted.  Motor:  Muscle bulk and tone are normal. Strength is  5 / 5 in all 4 extremities.   Sensory: Sensory testing is intact to pinprick, soft touch, vibration sensation, and position sense on all 4 extremities.  Coordination: Cerebellar testing reveals good finger-nose-finger and  heel-to-shin bilaterally.  Gait and station: Station and gait are normal. Tandem gait is very minimally wide. Romberg is negative.   Reflexes: Deep tendon reflexes are symmetric and normal bilaterally.    DIAGNOSTIC DATA (LABS, IMAGING, TESTING) - I reviewed patient records, labs, notes, testing and imaging myself where available.  Lab Results  Component Value Date   WBC 6.4 07/08/2014   HGB 13.0 03/09/2014   HCT 41.0 07/08/2014   MCV 87 03/09/2014   PLT 398 01/22/2014      Component Value Date/Time   NA 138 05/09/2012 0830   K 3.7 05/09/2012 0830   CL 108* 05/09/2012 0830   CO2 23 05/09/2012 0830   GLUCOSE 84 05/09/2012 0830   BUN 16 05/09/2012 0830   CREATININE 0.95 05/09/2012 0830   CALCIUM 8.6 05/09/2012 0830   PROT 6.4 11/21/2012 1438   PROT 6.7 05/09/2012 0830   ALBUMIN 4.4 11/21/2012 1438   ALBUMIN 3.4 05/09/2012 0830   AST 11 11/21/2012 1438   AST 17 05/09/2012 0830   ALT 8 11/21/2012 1438   ALT 16 05/09/2012 0830   ALKPHOS 41 11/21/2012 1438   ALKPHOS 55 05/09/2012 0830   BILITOT 0.4 11/21/2012 1438   BILITOT 0.7 05/09/2012 0830   GFRNONAA >60 05/09/2012 0830   GFRNONAA >60 05/15/2011 1603   GFRAA >60 05/09/2012 0830   GFRAA >60 05/15/2011 1603        ASSESSMENT AND PLAN  Multiple sclerosis (HCC)  Gait disturbance  Attention deficit disorder  Anxiety  Other fatigue  Incomplete bladder emptying  1.  Continue Tecfidera. I will check a CBC with differential to make sure she does not have severe lymphopenia.  . 2.  She should remain active and exercises as tolerated. 3.   Renew Concerta 36 mg daily  4.   Return in 6 months or call sooner if new or worsening neurologic symptoms.   Richard A. Epimenio Foot, MD, PhD 01/31/2015, 1:55 PM Certified in Neurology, Clinical Neurophysiology, Sleep Medicine, Pain Medicine and Neuroimaging  [facility] [address] [place], [place] [address] [phone]

## 2015-02-01 LAB — CBC WITH DIFFERENTIAL/PLATELET
BASOS ABS: 0.1 10*3/uL (ref 0.0–0.2)
Basos: 1 %
EOS (ABSOLUTE): 0.1 10*3/uL (ref 0.0–0.4)
Eos: 1 %
Hematocrit: 41.1 % (ref 34.0–46.6)
Hemoglobin: 13.8 g/dL (ref 11.1–15.9)
IMMATURE GRANS (ABS): 0 10*3/uL (ref 0.0–0.1)
Immature Granulocytes: 0 %
LYMPHS: 28 %
Lymphocytes Absolute: 2 10*3/uL (ref 0.7–3.1)
MCH: 30.9 pg (ref 26.6–33.0)
MCHC: 33.6 g/dL (ref 31.5–35.7)
MCV: 92 fL (ref 79–97)
Monocytes Absolute: 0.6 10*3/uL (ref 0.1–0.9)
Monocytes: 8 %
Neutrophils Absolute: 4.4 10*3/uL (ref 1.4–7.0)
Neutrophils: 62 %
PLATELETS: 418 10*3/uL — AB (ref 150–379)
RBC: 4.46 x10E6/uL (ref 3.77–5.28)
RDW: 12.9 % (ref 12.3–15.4)
WBC: 7 10*3/uL (ref 3.4–10.8)

## 2015-02-02 ENCOUNTER — Telehealth: Payer: Self-pay | Admitting: *Deleted

## 2015-02-02 NOTE — Telephone Encounter (Signed)
Pt returned call. Please call when you have time. Thank you [phone]

## 2015-02-02 NOTE — Telephone Encounter (Signed)
Spoke to pt about lab results per Dr Epimenio Foot note. Pt verbalized understanding.

## 2015-02-02 NOTE — Telephone Encounter (Signed)
LVM for pt to call about results. Gave [facility] phone number.  

## 2015-02-02 NOTE — Telephone Encounter (Signed)
-----   Message from Asa Lente, MD sent at 02/01/2015  8:15 PM EST ----- Please let her know the cbc and lymphocytes count look good...   Continue tecfidera

## 2015-02-27 DIAGNOSIS — Z8639 Personal history of other endocrine, nutritional and metabolic disease: Secondary | ICD-10-CM

## 2015-02-27 HISTORY — DX: Personal history of other endocrine, nutritional and metabolic disease: Z86.39

## 2015-03-01 MED FILL — AZELASTINE HCL 137 MCG SPRY: 0.1 | 30 days supply | Qty: 30 | Fill #0

## 2015-03-01 MED FILL — FLUoxetine HCL 10 MG CAPS: 10 | 30 days supply | Qty: 30 | Fill #2

## 2015-03-01 MED FILL — METHYLPHENIDATE ER 36 MG TA: 36 | 45 days supply | Qty: 90 | Fill #0

## 2015-03-01 MED FILL — FLUTICASONE PROP 50 MCG SPR: 50 | 30 days supply | Qty: 16 | Fill #0

## 2015-03-04 ENCOUNTER — Ambulatory Visit: Admission: EM | Admit: 2015-03-04 | Discharge: 2015-03-04 | Discharge status: Absent without leave | Payer: 59

## 2015-03-09 ENCOUNTER — Telehealth: Payer: 59 | Admitting: Nurse Practitioner

## 2015-03-09 DIAGNOSIS — J0101 Acute recurrent maxillary sinusitis: Secondary | ICD-10-CM

## 2015-03-09 MED ORDER — AMOXICILLIN-POT CLAVULANATE 875-125 MG PO TABS: 1.0000 | ORAL_TABLET | Freq: Two times a day (BID) | ORAL | Status: DC

## 2015-03-09 NOTE — Progress Notes (Signed)

## 2015-03-10 ENCOUNTER — Ambulatory Visit: Payer: Self-pay | Admitting: Physician Assistant

## 2015-03-18 ENCOUNTER — Telehealth: Payer: Self-pay | Admitting: Neurology

## 2015-03-18 MED ORDER — DIMETHYL FUMARATE 240 MG PO CPDR: 240.0000 mg | DELAYED_RELEASE_CAPSULE | Freq: Two times a day (BID) | ORAL | Status: DC

## 2015-03-18 NOTE — Telephone Encounter (Signed)
Lenda Kelp with [facility] [phone] called wanting verbal RX for Tecfidera for 240mg  today. She sts patient is out of medication. Fax #[phone]

## 2015-03-18 NOTE — Telephone Encounter (Signed)
Rx. for Tecfidera 240mg  one po bid #180 with 3 r/f called to [facility]--pharmacist Kimesh/fim

## 2015-03-21 ENCOUNTER — Encounter: Payer: Self-pay | Admitting: *Deleted

## 2015-04-04 MED FILL — FLUoxetine HCL 10 MG CAPS: 10 | 30 days supply | Qty: 30 | Fill #3

## 2015-05-09 MED FILL — FLUoxetine HCL 10 MG CAPS: 10 | 30 days supply | Qty: 30 | Fill #4

## 2015-05-16 ENCOUNTER — Other Ambulatory Visit: Payer: Self-pay | Admitting: Obstetrics and Gynecology

## 2015-05-19 MED FILL — AZELASTINE HCL 137 MCG SPRY: 0.1 | 30 days supply | Qty: 30 | Fill #1

## 2015-05-19 MED FILL — FLUTICASONE PROP 50 MCG SPR: 50 | 30 days supply | Qty: 16 | Fill #1

## 2015-05-26 ENCOUNTER — Telehealth: Payer: Self-pay | Admitting: Neurology

## 2015-05-26 MED ORDER — METHYLPHENIDATE HCL ER (OSM) 36 MG PO TBCR: EXTENDED_RELEASE_TABLET | ORAL | Status: DC

## 2015-05-26 NOTE — Telephone Encounter (Signed)
Rx. awaiting RAS sig/fim 

## 2015-05-26 NOTE — Telephone Encounter (Signed)
Rx. up front [facility]/fim 

## 2015-05-26 NOTE — Telephone Encounter (Signed)
Pt is requesting a refill on methylphenidate 36 MG PO CR tablet with a 3 month supply . Thank you

## 2015-06-01 DIAGNOSIS — Z23 Encounter for immunization: Secondary | ICD-10-CM | POA: Diagnosis not present

## 2015-06-01 DIAGNOSIS — E034 Atrophy of thyroid (acquired): Secondary | ICD-10-CM | POA: Diagnosis not present

## 2015-06-01 DIAGNOSIS — F419 Anxiety disorder, unspecified: Secondary | ICD-10-CM | POA: Diagnosis not present

## 2015-06-01 DIAGNOSIS — J4520 Mild intermittent asthma, uncomplicated: Secondary | ICD-10-CM | POA: Diagnosis not present

## 2015-06-01 DIAGNOSIS — G35 Multiple sclerosis: Secondary | ICD-10-CM | POA: Diagnosis not present

## 2015-06-02 DIAGNOSIS — Z0000 Encounter for general adult medical examination without abnormal findings: Secondary | ICD-10-CM | POA: Diagnosis not present

## 2015-06-02 DIAGNOSIS — G35 Multiple sclerosis: Secondary | ICD-10-CM | POA: Diagnosis not present

## 2015-06-02 DIAGNOSIS — E034 Atrophy of thyroid (acquired): Secondary | ICD-10-CM | POA: Diagnosis not present

## 2015-06-06 ENCOUNTER — Other Ambulatory Visit: Payer: Self-pay | Admitting: Obstetrics and Gynecology

## 2015-06-06 MED FILL — FLUoxetine HCL 10 MG CAPS: 10 | 30 days supply | Qty: 30 | Fill #0

## 2015-06-07 MED FILL — METHYLPHENIDATE ER 36 MG TA: 36 | 45 days supply | Qty: 90 | Fill #0

## 2015-06-17 ENCOUNTER — Encounter: Payer: Self-pay | Admitting: Obstetrics and Gynecology

## 2015-07-04 MED FILL — FLUoxetine HCL 10 MG CAPS: 10 | 30 days supply | Qty: 30 | Fill #1

## 2015-07-18 ENCOUNTER — Other Ambulatory Visit: Payer: Self-pay | Admitting: Obstetrics and Gynecology

## 2015-08-02 ENCOUNTER — Ambulatory Visit (INDEPENDENT_AMBULATORY_CARE_PROVIDER_SITE_OTHER): Payer: 59 | Admitting: Neurology

## 2015-08-02 ENCOUNTER — Encounter: Payer: Self-pay | Admitting: Neurology

## 2015-08-02 VITALS — BP 128/66 | HR 72 | Resp 16 | Ht 62.0 in | Wt 173.5 lb

## 2015-08-02 DIAGNOSIS — G35 Multiple sclerosis: Secondary | ICD-10-CM

## 2015-08-02 DIAGNOSIS — R269 Unspecified abnormalities of gait and mobility: Secondary | ICD-10-CM | POA: Diagnosis not present

## 2015-08-02 DIAGNOSIS — E559 Vitamin D deficiency, unspecified: Secondary | ICD-10-CM | POA: Diagnosis not present

## 2015-08-02 DIAGNOSIS — R209 Unspecified disturbances of skin sensation: Secondary | ICD-10-CM | POA: Diagnosis not present

## 2015-08-02 DIAGNOSIS — R5383 Other fatigue: Secondary | ICD-10-CM | POA: Diagnosis not present

## 2015-08-02 MED ORDER — METHYLPHENIDATE HCL ER (OSM) 36 MG PO TBCR: EXTENDED_RELEASE_TABLET | ORAL | Status: DC

## 2015-08-02 NOTE — Progress Notes (Signed)
[facility]  PATIENT: Carrie Nelson DOB: [birth year]  REFERRING CLINICIAN: Daniel Nones HISTORY FROM: Patient   REASON FOR VISIT: Multiple Sclerosis   HISTORICAL  CHIEF COMPLAINT:  Chief Complaint  Patient presents with  . Multiple Sclerosis    HISTORY OF PRESENT ILLNESS:  [name] is a 35 year old woman who was diagnosed with MS in 2007.  She restarted Tecfidera in 2016 after a pregnancy (son born in November 2015).    Prior to that she was on Tysabri and she felt she did better on Tysabri than on Tecfidera.    She is tolerating Tecfidera fairly well with some flushing, helped by taking after a meal and taking aspirin.   She had a small sensory exacerbation on Tecfidera shortly after starting.      As symptoms were mild, she was not treated with steroids.     Gait/strength/sensation:    She notes that the right leg is dragging at time especially if tired.    There is mild right leg weakness, noticeable with stair climbing.   She has mild right foot sensation changes that are not bothersome.     Bladder function is fine.     Vision is fine.     Fatigue/sleep/cognition:     She has a lot of fatigue but is much better she has tried Concerta 36 mg and does better with that than 18 mg or Phentermine.   She is less sleepy in the afternoons now.   She sleeps well at night now that her son sleeps through the night.     She notes that her decreased focus is also better.    She denies memory problems.     Mood:   She denies any depression.   She notes mild anxiety helped by Prozac 10 mg   MS History:   She was diagnosed in 2007 but,  in retrospect, had right foot dragging that started around 2005. Then shortly after that she had an episode of left facial numbness. In 2007 she had an episode of slurred speech and worsening clumsiness.  MRI of the brain that was consistent with multiple sclerosis. She also had a lumbar puncture and her CSF compatible with the  diagnosis of MS. Most of her symptoms improved over the next few months but she did not get 100% back to baseline and she continues to have minimal residual weakness, clumsiness and numbness. She was initially placed on Betaseron but switched to Tysabri because of some generalized side effects of Betaseron. She was seeing Dr. Tinnie Gens 2007 until 2014.   She was on Tysabri between 2008 and late 2014 when she stopped because of a desire to get pregnant. She became pregnant in early 2015 and delivered in November 2015. She had not been on any disease modifying therapy since late 2014 and then started Tecfidera 02/2014.       REVIEW OF SYSTEMS:  Constitutional: No fevers, chills, sweats, or change in appetite Eyes: No visual changes, double vision, eye pain Ear, nose and throat: No hearing loss, ear pain, nasal congestion, sore throat Cardiovascular: No chest pain, palpitations Respiratory:  No shortness of breath at rest or with exertion.   No wheezes GastrointestinaI: No nausea, vomiting, diarrhea, abdominal pain, fecal incontinence Genitourinary:  No dysuria, urinary retention or frequency.  No nocturia. Musculoskeletal:  No neck pain, back pain Integumentary: No rash, pruritus, skin lesions Neurological: as above Psychiatric: No depression at this time.  No anxiety.   Occasionally  she has irritability. Endocrine: She she is borderline diabetic. No palpitations, diaphoresis, change in appetite, change in weigh or increased thirst Hematologic/Lymphatic:  No anemia, purpura, petechiae. Allergic/Immunologic: She has seasonal allergies and is treated with several medications.  ALLERGIES: No Known Allergies  HOME MEDICATIONS: Outpatient Prescriptions Prior to Visit  Medication Sig Dispense Refill  . Azelastine-Fluticasone (DYMISTA) 137-50 MCG/ACT SUSP 1 spray by Nasal route 2 (two) times daily.    . Cetirizine HCl (ZYRTEC ALLERGY) 10 MG CAPS Take 10 mg by mouth daily.    . cholecalciferol (VITAMIN  D) 1000 UNITS tablet Take 2,000 Units by mouth daily.     . Dimethyl Fumarate 240 MG CPDR Take 1 capsule (240 mg total) by mouth 2 (two) times daily. 180 capsule 3  . fexofenadine (ALLEGRA) 180 MG tablet Take by mouth.    Marland Kitchen FLUoxetine (PROZAC) 10 MG capsule TAKE 1 CAPSULE BY MOUTH ONCE DAILY 30 capsule 4  . LARIN 1.5/30 1.5-30 MG-MCG tablet TAKE 1 TABLET BY MOUTH DAILY 63 tablet 4  . metFORMIN (GLUCOPHAGE) 500 MG tablet TAKE 1 TABLET BY MOUTH TWO TIMES DAILY 60 tablet 12  . methylphenidate 36 MG PO CR tablet Take one to two tablets daily as needed. 90 tablet 0  . montelukast (SINGULAIR) 10 MG tablet Take 10 mg by mouth at bedtime.    . Multiple Vitamins-Minerals (MULTIVITAMIN PO) Take by mouth.    . SYNTHROID 25 MCG tablet TAKE 1 TABLET BY MOUTH DAILY 90 tablet 4  . fluticasone (FLONASE) 50 MCG/ACT nasal spray Place into the nose.    . NORETHINDRONE PO Take by mouth daily.    Marland Kitchen amoxicillin-clavulanate (AUGMENTIN) 875-125 MG tablet Take 1 tablet by mouth 2 (two) times daily. 20 tablet 0  . cyanocobalamin (,VITAMIN B-12,) 1000 MCG/ML injection Inject 1 mL (1,000 mcg total) into the muscle once. (Patient not taking: Reported on 01/31/2015) 10 mL 1  . metFORMIN (GLUCOPHAGE) 850 MG tablet TAKE ONE TABLET BY MOUTH EARLY IN THE MORNING 90 tablet 4  . nitrofurantoin, macrocrystal-monohydrate, (MACROBID) 100 MG capsule Take 1 capsule (100 mg total) by mouth at bedtime. (Patient not taking: Reported on 01/31/2015) 30 capsule 1  . phentermine (ADIPEX-P) 37.5 MG tablet Take 1 tablet (37.5 mg total) by mouth daily before breakfast. (Patient not taking: Reported on 01/31/2015) 30 tablet 2   No facility-administered medications prior to visit.    PAST MEDICAL HISTORY: Past Medical History  Diagnosis Date  . Thyroid disease   . Syncope   . Multiple sclerosis (HCC)   . Tremor     PAST SURGICAL HISTORY: Past Surgical History  Procedure Laterality Date  . Knee arthroscopy w/ acl reconstruction Left 2010    . Bunionectomy    . Foot surgery      FAMILY HISTORY: Family History  Problem Relation Age of Onset  . Prostate cancer Father   . Diabetes Father   . Arthritis    . Dementia    . Hypertension    . Heart disease      SOCIAL HISTORY:  Social History   Social History  . Marital Status: Single    Spouse Name: N/A  . Number of Children: o  . Years of Education: N/A   Occupational History  . Not on file.   Social History Main Topics  . Smoking status: Never Smoker   . Smokeless tobacco: Never Used  . Alcohol Use: Yes     Comment: minimal weekly   . Drug Use: No  .  Sexual Activity: Yes    Birth Control/ Protection: Pill   Other Topics Concern  . Not on file   Social History Narrative   Patient lives with parents.   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    Patient has a Probation officer.      PHYSICAL EXAM  Filed Vitals:   08/02/15 1305  BP: 128/66  Pulse: 72  Resp: 16  Height: 5\' 2"  (1.575 m)  Weight: 173 lb 8 oz (78.699 kg)    Body mass index is 31.73 kg/(m^2).   General: The patient is well-developed and well-nourished and in no acute distress   Neurologic Exam  Mental status: The patient is alert and oriented x 3 at the time of the examination. The patient has apparent normal recent and remote memory, with an apparently normal attention span and concentration ability.   Speech is normal.  Cranial nerves: Extraocular movements are full.   Facial symmetry is present. There is good facial sensation to soft touch bilaterally.Facial strength is normal.  Trapezius and sternocleidomastoid strength is normal. No dysarthria is noted.   No obvious hearing deficits are noted.  Motor:  Muscle bulk and tone are normal. Strength is  5 / 5 in all 4 extremities.   Sensory: Sensory testing is intact to temperature, soft touch, vibration sensation, and position sense on all 4  extremities.  Coordination: Cerebellar testing reveals good finger-nose-finger and heel-to-shin bilaterally.  Gait and station: Station and gait are normal. Tandem gait is very minimally wide. Romberg is negative.   Reflexes: Deep tendon reflexes are symmetric and normal bilaterally.      DIAGNOSTIC DATA (LABS, IMAGING, TESTING) - I reviewed patient records, labs, notes, testing and imaging myself where available.  Lab Results  Component Value Date   WBC 7.0 01/31/2015   HGB 13.0 03/09/2014   HCT 41.1 01/31/2015   MCV 92 01/31/2015   PLT 418* 01/31/2015      Component Value Date/Time   NA 138 05/09/2012 0830   K 3.7 05/09/2012 0830   CL 108* 05/09/2012 0830   CO2 23 05/09/2012 0830   GLUCOSE 84 05/09/2012 0830   BUN 16 05/09/2012 0830   CREATININE 0.95 05/09/2012 0830   CALCIUM 8.6 05/09/2012 0830   PROT 6.4 11/21/2012 1438   PROT 6.7 05/09/2012 0830   ALBUMIN 4.4 11/21/2012 1438   ALBUMIN 3.4 05/09/2012 0830   AST 11 11/21/2012 1438   AST 17 05/09/2012 0830   ALT 8 11/21/2012 1438   ALT 16 05/09/2012 0830   ALKPHOS 41 11/21/2012 1438   ALKPHOS 55 05/09/2012 0830   BILITOT 0.4 11/21/2012 1438   BILITOT 0.7 05/09/2012 0830   GFRNONAA >60 05/09/2012 0830   GFRNONAA >60 05/15/2011 1603   GFRAA >60 05/09/2012 0830   GFRAA >60 05/15/2011 1603        ASSESSMENT AND PLAN  Multiple sclerosis (HCC)  Gait disturbance  Disturbance of skin sensation  Other fatigue  1.  Continue Tecfidera. I will check a CBC with differential to make sure she does not have severe lymphopenia.    We also discussed other therapies including Lemtrada in ocrelizumab and Tysabri. She had been on Tysabri in the past and done very well. She would like to reconsider this and we will check his JCV antibody. Additionally, for family planning reasons she may consider Lemtrada. We went over the risks and benefits of the various treatments.  We will check  a surveillance MRI to make sure that there  is no subclinical progression of her MS later this year. 2.  She should remain active and exercises as tolerated. 3.   Renew Concerta 36 mg daily  4.   Return in 6 months or call sooner if new or worsening neurologic symptoms.   Tierrah Anastos A. Epimenio Foot, MD, PhD 08/02/2015, 1:17 PM Certified in Neurology, Clinical Neurophysiology, Sleep Medicine, Pain Medicine and Neuroimaging  [facility] [address], [address] [place], [place] [address] [phone]-

## 2015-08-03 LAB — CBC WITH DIFFERENTIAL/PLATELET
BASOS ABS: 0 10*3/uL (ref 0.0–0.2)
Basos: 1 %
EOS (ABSOLUTE): 0.1 10*3/uL (ref 0.0–0.4)
Eos: 1 %
Hematocrit: 42.4 % (ref 34.0–46.6)
Hemoglobin: 14 g/dL (ref 11.1–15.9)
IMMATURE GRANULOCYTES: 0 %
Immature Grans (Abs): 0 10*3/uL (ref 0.0–0.1)
Lymphocytes Absolute: 1.5 10*3/uL (ref 0.7–3.1)
Lymphs: 33 %
MCH: 30.8 pg (ref 26.6–33.0)
MCHC: 33 g/dL (ref 31.5–35.7)
MCV: 93 fL (ref 79–97)
MONOS ABS: 0.4 10*3/uL (ref 0.1–0.9)
Monocytes: 8 %
NEUTROS PCT: 57 %
Neutrophils Absolute: 2.6 10*3/uL (ref 1.4–7.0)
PLATELETS: 371 10*3/uL (ref 150–379)
RBC: 4.55 x10E6/uL (ref 3.77–5.28)
RDW: 13 % (ref 12.3–15.4)
WBC: 4.6 10*3/uL (ref 3.4–10.8)

## 2015-08-03 LAB — VITAMIN D 25 HYDROXY (VIT D DEFICIENCY, FRACTURES): Vit D, 25-Hydroxy: 40 ng/mL (ref 30.0–100.0)

## 2015-08-04 ENCOUNTER — Telehealth: Payer: Self-pay | Admitting: *Deleted

## 2015-08-04 MED ORDER — METHYLPREDNISOLONE 4 MG PO TBPK: ORAL_TABLET | ORAL | Status: DC

## 2015-08-04 NOTE — Telephone Encounter (Signed)
I have spoken with Carrie Nelson this morning.  She c/o tingling sensation groin area only.  Denies other sx.  Per RAS, it is difficult to say if this represents an MS exacerbation.  Generally when numbness is isolated to the groin region, it is not MS.  Ok for SM 1gm IV for one day, or dose pk.  Carrie Nelson sts. she would prefer dose pk. at this time.  She will call back if sx. persists or worsens.  Rx. escribed to [facility] pharmacy per her request/fim

## 2015-08-10 ENCOUNTER — Encounter: Payer: Self-pay | Admitting: *Deleted

## 2015-08-15 MED FILL — FLUoxetine HCL 10 MG CAPS: 10 | 30 days supply | Qty: 30 | Fill #2

## 2015-08-17 MED FILL — METHYLPHENIDATE ER 36 MG TA: 36 | 45 days supply | Qty: 90 | Fill #0

## 2015-08-18 ENCOUNTER — Ambulatory Visit (INDEPENDENT_AMBULATORY_CARE_PROVIDER_SITE_OTHER): Payer: 59 | Admitting: Obstetrics and Gynecology

## 2015-08-18 ENCOUNTER — Encounter: Payer: Self-pay | Admitting: Obstetrics and Gynecology

## 2015-08-18 VITALS — BP 123/65 | HR 92 | Ht 62.0 in | Wt 170.2 lb

## 2015-08-18 DIAGNOSIS — Z01419 Encounter for gynecological examination (general) (routine) without abnormal findings: Secondary | ICD-10-CM

## 2015-08-18 DIAGNOSIS — K5909 Other constipation: Secondary | ICD-10-CM | POA: Insufficient documentation

## 2015-08-18 DIAGNOSIS — K5900 Constipation, unspecified: Secondary | ICD-10-CM

## 2015-08-18 MED ORDER — METFORMIN HCL ER 750 MG PO TB24: 750.0000 mg | ORAL_TABLET | Freq: Every day | ORAL | Status: DC

## 2015-08-18 MED ORDER — LINACLOTIDE 145 MCG PO CAPS: 145.0000 ug | ORAL_CAPSULE | Freq: Every day | ORAL | Status: DC

## 2015-08-18 NOTE — Progress Notes (Signed)
Subjective:   Carrie Nelson is a 35 y.o. G22P1001 Caucasian female here for a routine well-woman exam.  No LMP recorded. Patient is not currently having periods (Reason: Oral contraceptives).    Current complaints: nightly metformin makes her sugar drop too low, and stopped it this month. PCP: Graciela Husbands       Does need labs  Social History: Sexual: heterosexual Marital Status: married Living situation: with family Occupation: Education at [employer] Tobacco/alcohol: no tobacco use Illicit drugs: no history of illicit drug use  The following portions of the patient's history were reviewed and updated as appropriate: allergies, current medications, past family history, past medical history, past social history, past surgical history and problem list.  Past Medical History Past Medical History  Diagnosis Date  . Thyroid disease   . Syncope   . Multiple sclerosis (HCC)   . Tremor     Past Surgical History Past Surgical History  Procedure Laterality Date  . Knee arthroscopy w/ acl reconstruction Left 2010  . Bunionectomy    . Foot surgery      Gynecologic History G1P1001  No LMP recorded. Patient is not currently having periods (Reason: Oral contraceptives). Contraception: OCP (estrogen/progesterone) Last Pap: 2016. Results were: normal   Obstetric History OB History  Gravida Para Term Preterm AB SAB TAB Ectopic Multiple Living  # Outcome Date GA Lbr Len/2nd Weight Sex Delivery Anes PTL Lv  1 Term 01/22/14    M CS-LTranv   Y      Current Medications Current Outpatient Prescriptions on File Prior to Visit  Medication Sig Dispense Refill  . Azelastine-Fluticasone (DYMISTA) 137-50 MCG/ACT SUSP 1 spray by Nasal route 2 (two) times daily.    . Cetirizine HCl (ZYRTEC ALLERGY) 10 MG CAPS Take 10 mg by mouth daily.    . cholecalciferol (VITAMIN D) 1000 UNITS tablet Take 2,000 Units by mouth daily.     . Dimethyl Fumarate 240 MG CPDR Take 1 capsule (240 mg  total) by mouth 2 (two) times daily. 180 capsule 3  . fexofenadine (ALLEGRA) 180 MG tablet Take by mouth.    Marland Kitchen FLUoxetine (PROZAC) 10 MG capsule TAKE 1 CAPSULE BY MOUTH ONCE DAILY 30 capsule 4  . fluticasone (FLONASE) 50 MCG/ACT nasal spray Place into the nose.    Marland Kitchen LARIN 1.5/30 1.5-30 MG-MCG tablet TAKE 1 TABLET BY MOUTH DAILY 63 tablet 4  . methylphenidate 36 MG PO CR tablet Take one to two tablets daily as needed. 90 tablet 0  . montelukast (SINGULAIR) 10 MG tablet Take 10 mg by mouth at bedtime.    . Multiple Vitamins-Minerals (MULTIVITAMIN PO) Take by mouth.    . NORETHINDRONE PO Take by mouth daily.    Marland Kitchen SYNTHROID 25 MCG tablet TAKE 1 TABLET BY MOUTH DAILY 90 tablet 4   No current facility-administered medications on file prior to visit.    Review of Systems Patient denies any headaches, blurred vision, shortness of breath, chest pain, abdominal pain, problems with bowel movements, urination, or intercourse.  Objective:  BP 123/65 mmHg  Pulse 92  Ht  (1.575 m)  Wt 170 lb 4 oz (77.225 kg)  BMI 31.13 kg/m2  LMP  Physical Exam  General:  Well developed, well nourished, no acute distress. She is alert and oriented x3. Skin:  Warm and dry Neck:  Midline trachea, no thyromegaly or nodules Cardiovascular: Regular rate and rhythm, no murmur heard Lungs:  Effort normal, all lung fields clear to auscultation bilaterally Breasts:  No dominant palpable mass, retraction, or nipple discharge Abdomen:  Soft, non tender, no hepatosplenomegaly or masses Pelvic:  External genitalia is normal in appearance.  The vagina is normal in appearance. The cervix is bulbous, no CMT.  Thin prep pap is not done. Uterus is felt to be normal size, shape, and contour.  No adnexal masses or tenderness noted. Extremities:  No swelling or varicosities noted Psych:  She has a normal mood and affect  Assessment:   Healthy well-woman exam Obesity MS Chronic constipation Pre-diabetes   Plan:  Labs  repeated Metformin changed to 750 XL daily Linzess added prn F/U 1 year for AE, or sooner if needed   Melody Suzan Nailer, CNM

## 2015-08-18 NOTE — Patient Instructions (Signed)
Place annual gynecologic exam patient instructions here.

## 2015-08-19 LAB — COMPREHENSIVE METABOLIC PANEL
A/G RATIO: 1.7 (ref 1.2–2.2)
ALBUMIN: 4.4 g/dL (ref 3.5–5.5)
ALT: 11 IU/L (ref 0–32)
AST: 11 IU/L (ref 0–40)
Alkaline Phosphatase: 46 IU/L (ref 39–117)
BUN/Creatinine Ratio: 24 — ABNORMAL HIGH (ref 9–23)
BUN: 16 mg/dL (ref 6–20)
Bilirubin Total: 0.6 mg/dL (ref 0.0–1.2)
CALCIUM: 9.8 mg/dL (ref 8.7–10.2)
CO2: 19 mmol/L (ref 18–29)
CREATININE: 0.68 mg/dL (ref 0.57–1.00)
Chloride: 100 mmol/L (ref 96–106)
GFR, EST AFRICAN AMERICAN: 130 mL/min/{1.73_m2} (ref >59)
GFR, EST NON AFRICAN AMERICAN: 113 mL/min/{1.73_m2} (ref >59)
GLOBULIN, TOTAL: 2.6 g/dL (ref 1.5–4.5)
Glucose: 105 mg/dL — ABNORMAL HIGH (ref 65–99)
POTASSIUM: 4.2 mmol/L (ref 3.5–5.2)
SODIUM: 137 mmol/L (ref 134–144)
TOTAL PROTEIN: 7 g/dL (ref 6.0–8.5)

## 2015-08-19 LAB — HEMOGLOBIN A1C
Est. average glucose Bld gHb Est-mCnc: 103 mg/dL
Hgb A1c MFr Bld: 5.2 % (ref 4.8–5.6)

## 2015-09-01 MED FILL — FLUTICASONE PROP 50 MCG SPR: 50 | 30 days supply | Qty: 16 | Fill #0

## 2015-09-01 MED FILL — AZELASTINE HCL 137 MCG SPRY: 0.1 | 50 days supply | Qty: 30 | Fill #0

## 2015-09-09 MED FILL — FLUoxetine HCL 10 MG CAPS: 10 | 30 days supply | Qty: 30 | Fill #3

## 2015-09-09 MED FILL — METFORMIN HCL ER 750 MG TAB: 750 | 30 days supply | Qty: 30 | Fill #0

## 2015-09-12 ENCOUNTER — Encounter: Payer: Self-pay | Admitting: Obstetrics and Gynecology

## 2015-09-14 ENCOUNTER — Other Ambulatory Visit: Payer: Self-pay | Admitting: Obstetrics and Gynecology

## 2015-09-14 MED ORDER — LINACLOTIDE 72 MCG PO CAPS: 72.0000 ug | ORAL_CAPSULE | Freq: Every day | ORAL | Status: DC

## 2015-09-29 DIAGNOSIS — Z0289 Encounter for other administrative examinations: Secondary | ICD-10-CM

## 2015-10-10 MED FILL — METFORMIN HCL ER 750 MG TAB: 750 | 30 days supply | Qty: 30 | Fill #1

## 2015-10-10 MED FILL — FLUoxetine HCL 10 MG CAPS: 10 | 30 days supply | Qty: 30 | Fill #4

## 2015-10-18 ENCOUNTER — Encounter: Payer: Self-pay | Admitting: Neurology

## 2015-10-18 ENCOUNTER — Telehealth: Payer: Self-pay | Admitting: *Deleted

## 2015-10-18 ENCOUNTER — Ambulatory Visit (INDEPENDENT_AMBULATORY_CARE_PROVIDER_SITE_OTHER): Payer: 59 | Admitting: Neurology

## 2015-10-18 VITALS — BP 110/64 | HR 82 | Resp 16 | Ht 62.0 in | Wt 173.0 lb

## 2015-10-18 DIAGNOSIS — R5383 Other fatigue: Secondary | ICD-10-CM | POA: Diagnosis not present

## 2015-10-18 DIAGNOSIS — G35 Multiple sclerosis: Secondary | ICD-10-CM | POA: Diagnosis not present

## 2015-10-18 DIAGNOSIS — Z79899 Other long term (current) drug therapy: Secondary | ICD-10-CM | POA: Diagnosis not present

## 2015-10-18 DIAGNOSIS — F909 Attention-deficit hyperactivity disorder, unspecified type: Secondary | ICD-10-CM | POA: Diagnosis not present

## 2015-10-18 DIAGNOSIS — F988 Other specified behavioral and emotional disorders with onset usually occurring in childhood and adolescence: Secondary | ICD-10-CM

## 2015-10-18 NOTE — Telephone Encounter (Signed)
LMOM--she came in for 1120 appt. this am, but provider was running behind, so she will come back this afternoon.  If she can come in at 3pm, he will have more time to spend with her./fim

## 2015-10-18 NOTE — Progress Notes (Signed)
[facility]  PATIENT: Carrie Nelson DOB: 1980-11-01  REFERRING CLINICIAN: Daniel Nones HISTORY FROM: Patient   REASON FOR VISIT: Multiple Sclerosis   HISTORICAL  CHIEF COMPLAINT:  Chief Complaint  Patient presents with  . Multiple Sclerosis    Sts. she continues to tolerate Tecfidera with occasional flushing.  She doesn't feel as well on Tecfidera--feels she still has breakthru MS sx.  She would like to discuss other tx. options/fim    HISTORY OF PRESENT ILLNESS:  [name] is a 35 year old woman who was diagnosed with MS in 2007.  She restarted Tecfidera in 2016 after a pregnancy (son born in November 2015).    Prior to that she was on Tysabri and she felt she did better on Tysabri than on Tecfidera.  Specifically, fatigue was much better on Tysabri.  She is having flushing, sometimes helped by taking after a meal and taking aspirin.   She had a small sensory exacerbation on Tecfidera shortly after starting.      As symptoms were mild, she was not treated with steroids.     Gait/strength/sensation:    She notes that the right leg is dragging at time especially if tired.    There is mild right leg weakness, noticeable with stair climbing.   She has mild right foot sensation changes that are not bothersome.     Bladder function is fine.              Vision is fine.     Fatigue/sleep/cognition:     She has a lot of fatigue but is much better she has tried Concerta 36 mg and does better with that than 18 mg or Phentermine.   She is less sleepy in the afternoons now.   She sleeps well at night now that her son sleeps through the night.     She notes that her decreased focus is also better.    She denies memory problems.     Mood:   She denies any depression.   She notes mild anxiety helped by Prozac 10 mg   MS History:   She was diagnosed in 2007 but,  in retrospect, had right foot dragging that started around 2005. Then shortly after that she had an  episode of left facial numbness. In 2007 she had an episode of slurred speech and worsening clumsiness.  MRI of the brain that was consistent with multiple sclerosis. She also had a lumbar puncture and her CSF compatible with the diagnosis of MS. Most of her symptoms improved over the next few months but she did not get 100% back to baseline and she continues to have minimal residual weakness, clumsiness and numbness. She was initially placed on Betaseron but switched to Tysabri because of some generalized side effects of Betaseron. She was seeing Dr. Tinnie Gens 2007 until 2014.   She was on Tysabri between 2008 and late 2014 when she stopped because of a desire to get pregnant. She became pregnant in early 2015 and delivered in November 2015. She had not been on any disease modifying therapy since late 2014 and then started Tecfidera 02/2014.       REVIEW OF SYSTEMS:  Constitutional: No fevers, chills, sweats, or change in appetite Eyes: No visual changes, double vision, eye pain Ear, nose and throat: No hearing loss, ear pain, nasal congestion, sore throat Cardiovascular: No chest pain, palpitations Respiratory:  No shortness of breath at rest or with exertion.   No wheezes GastrointestinaI: No  nausea, vomiting, diarrhea, abdominal pain, fecal incontinence Genitourinary:  No dysuria, urinary retention or frequency.  No nocturia. Musculoskeletal:  No neck pain, back pain Integumentary: No rash, pruritus, skin lesions Neurological: as above Psychiatric: No depression at this time.  No anxiety.   Occasionally she has irritability. Endocrine: She she is borderline diabetic. No palpitations, diaphoresis, change in appetite, change in weigh or increased thirst Hematologic/Lymphatic:  No anemia, purpura, petechiae. Allergic/Immunologic: She has seasonal allergies and is treated with several medications.  ALLERGIES: No Known Allergies  HOME MEDICATIONS: Outpatient Medications Prior to Visit  Medication  Sig Dispense Refill  . azelastine (ASTELIN) 0.1 % nasal spray   7  . Azelastine-Fluticasone (DYMISTA) 137-50 MCG/ACT SUSP 1 spray by Nasal route 2 (two) times daily.    . Cetirizine HCl (ZYRTEC ALLERGY) 10 MG CAPS Take 10 mg by mouth daily.    . cholecalciferol (VITAMIN D) 1000 UNITS tablet Take 2,000 Units by mouth daily.     . Dimethyl Fumarate 240 MG CPDR Take 1 capsule (240 mg total) by mouth 2 (two) times daily. 180 capsule 3  . fexofenadine (ALLEGRA) 180 MG tablet Take by mouth.    Marland Kitchen FLUoxetine (PROZAC) 10 MG capsule TAKE 1 CAPSULE BY MOUTH ONCE DAILY 30 capsule 4  . LARIN 1.5/30 1.5-30 MG-MCG tablet TAKE 1 TABLET BY MOUTH DAILY 63 tablet 4  . linaclotide (LINZESS) 72 MCG capsule Take 1 capsule (72 mcg total) by mouth daily before breakfast. 30 capsule 6  . metFORMIN (GLUCOPHAGE XR) 750 MG 24 hr tablet Take 1 tablet (750 mg total) by mouth daily with breakfast. 30 tablet 6  . methylphenidate 36 MG PO CR tablet Take one to two tablets daily as needed. 90 tablet 0  . montelukast (SINGULAIR) 10 MG tablet Take 10 mg by mouth at bedtime.    . Multiple Vitamins-Minerals (MULTIVITAMIN PO) Take by mouth.    . NORETHINDRONE PO Take by mouth daily.    Marland Kitchen SYNTHROID 25 MCG tablet TAKE 1 TABLET BY MOUTH DAILY 90 tablet 4  . fluticasone (FLONASE) 50 MCG/ACT nasal spray Place into the nose.     No facility-administered medications prior to visit.     PAST MEDICAL HISTORY: Past Medical History:  Diagnosis Date  . Multiple sclerosis (HCC)   . Syncope   . Thyroid disease   . Tremor     PAST SURGICAL HISTORY: Past Surgical History:  Procedure Laterality Date  . BUNIONECTOMY    . FOOT SURGERY    . KNEE ARTHROSCOPY W/ ACL RECONSTRUCTION Left 2010    FAMILY HISTORY: Family History  Problem Relation Age of Onset  . Prostate cancer Father   . Diabetes Father   . Arthritis    . Dementia    . Hypertension    . Heart disease      SOCIAL HISTORY:  Social History   Social History  .  Marital status: Married    Spouse name: N/A  . Number of children: o  . Years of education: N/A   Occupational History  . Not on file.   Social History Main Topics  . Smoking status: Never Smoker  . Smokeless tobacco: Never Used  . Alcohol use Yes     Comment: minimal weekly   . Drug use: No  . Sexual activity: Yes    Birth control/ protection: Pill   Other Topics Concern  . Not on file   Social History Narrative   Patient lives with parents.   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   Patient is single.    Patient has a Probation officerBachelors degree.      PHYSICAL EXAM  Vitals:   10/18/15 1508  BP: 110/64  Pulse: 82  Resp: 16  Weight: 173 lb (78.5 kg)  Height: 5\' 2"  (1.575 m)    Body mass index is 31.64 kg/m.   General: The patient is well-developed and well-nourished and in no acute distress   Neurologic Exam  Mental status: The patient is alert and oriented x 3 at the time of the examination. The patient has apparent normal recent and remote memory, with an apparently normal attention span and concentration ability.   Speech is normal.  Cranial nerves: Extraocular movements are full.   Facial symmetry is present. There is good facial sensation to soft touch bilaterally.Facial strength is normal.  Trapezius and sternocleidomastoid strength is normal. No dysarthria is noted.   No obvious hearing deficits are noted.  Motor:  Muscle bulk and tone are normal. Strength is  5 / 5 in all 4 extremities.   Sensory: Sensory testing is intact to temperature, soft touch, vibration sensation, and position sense on all 4 extremities.  Coordination: Cerebellar testing reveals good finger-nose-finger and heel-to-shin bilaterally.  Gait and station: Station and gait are normal. Tandem gait is very minimally wide. Romberg is negative.   Reflexes: Deep tendon reflexes are symmetric and normal bilaterally.      DIAGNOSTIC DATA (LABS, IMAGING,  TESTING) - I reviewed patient records, labs, notes, testing and imaging myself where available.  Lab Results  Component Value Date   WBC 4.6 08/02/2015   HGB 13.0 03/09/2014   HCT 42.4 08/02/2015   MCV 93 08/02/2015   PLT 371 08/02/2015      Component Value Date/Time   NA 137 08/18/2015 0950   NA 138 05/09/2012 0830   K 4.2 08/18/2015 0950   K 3.7 05/09/2012 0830   CL 100 08/18/2015 0950   CL 108 (H) 05/09/2012 0830   CO2 19 08/18/2015 0950   CO2 23 05/09/2012 0830   GLUCOSE 105 (H) 08/18/2015 0950   GLUCOSE 84 05/09/2012 0830   BUN 16 08/18/2015 0950   BUN 16 05/09/2012 0830   CREATININE 0.68 08/18/2015 0950   CREATININE 0.95 05/09/2012 0830   CALCIUM 9.8 08/18/2015 0950   CALCIUM 8.6 05/09/2012 0830   PROT 7.0 08/18/2015 0950   PROT 6.7 05/09/2012 0830   ALBUMIN 4.4 08/18/2015 0950   ALBUMIN 3.4 05/09/2012 0830   AST 11 08/18/2015 0950   AST 17 05/09/2012 0830   ALT 11 08/18/2015 0950   ALT 16 05/09/2012 0830   ALKPHOS 46 08/18/2015 0950   ALKPHOS 55 05/09/2012 0830   BILITOT 0.6 08/18/2015 0950   BILITOT 0.7 05/09/2012 0830   GFRNONAA 113 08/18/2015 0950   GFRNONAA >60 05/09/2012 0830   GFRAA 130 08/18/2015 0950   GFRAA >60 05/09/2012 0830        ASSESSMENT AND PLAN  Multiple sclerosis (HCC) - Plan: TSH, CBC with Differential/Platelet, Comprehensive metabolic panel, Stratify JCV Antibody Test ([facility]), Urinalysis, Routine w reflex microscopic (not at [facility]), MR Brain W Wo Contrast  Other fatigue  Attention deficit disorder  High risk medication use    1.  She is still having difficulty tolerating Tecfidera. We went over her options of other disease modifying therapies. She is considering having another child in the next year or 2. We discussed, in detail, Tysabri, Lemtrada and ocrelizumab as other highly  effective medications. Julaine Hua offers an additional advantage off its dosing schedule would make it easier for her to do family planning. She is most  interested in [place] and understands the risks of autoimmune disorders that may occur. She understands that she would need to do monitoring for 4 years on a monthly basis after her last Lemtrada dose.     I will check TSH, CMP, CBC, UA, JCV antibody. She will decide between [place] after we get the lab results. We will check a surveillance MRI to make sure that there is no subclinical progression of her MS . 2.  She should remain active and exercises as tolerated. 3.  Continue Concerta 36 mg one or two daily  4.   Return in 3 months or call sooner if new or worsening neurologic symptoms.  Rehabilitation in a face-to-face evaluation with greater than one half of the time counseling and coordinating care about her multiple sclerosis, family planning and disease modifying therapy options.   Kavon Valenza A. Epimenio Foot, MD, PhD 10/18/2015, 3:16 PM Certified in Neurology, Clinical Neurophysiology, Sleep Medicine, Pain Medicine and Neuroimaging  [facility] [address], [address] [place], [place] [address] [phone]-

## 2015-10-19 LAB — COMPREHENSIVE METABOLIC PANEL
A/G RATIO: 1.7 (ref 1.2–2.2)
ALT: 9 IU/L (ref 0–32)
AST: 12 IU/L (ref 0–40)
Albumin: 4.2 g/dL (ref 3.5–5.5)
Alkaline Phosphatase: 43 IU/L (ref 39–117)
BILIRUBIN TOTAL: 0.3 mg/dL (ref 0.0–1.2)
BUN/Creatinine Ratio: 21 (ref 9–23)
BUN: 13 mg/dL (ref 6–20)
CALCIUM: 9.7 mg/dL (ref 8.7–10.2)
CO2: 23 mmol/L (ref 18–29)
Chloride: 101 mmol/L (ref 96–106)
Creatinine, Ser: 0.62 mg/dL (ref 0.57–1.00)
GFR, EST AFRICAN AMERICAN: 134 mL/min/{1.73_m2} (ref >59)
GFR, EST NON AFRICAN AMERICAN: 116 mL/min/{1.73_m2} (ref >59)
GLOBULIN, TOTAL: 2.5 g/dL (ref 1.5–4.5)
Glucose: 102 mg/dL — ABNORMAL HIGH (ref 65–99)
POTASSIUM: 4.3 mmol/L (ref 3.5–5.2)
SODIUM: 141 mmol/L (ref 134–144)
Total Protein: 6.7 g/dL (ref 6.0–8.5)

## 2015-10-19 LAB — CBC WITH DIFFERENTIAL/PLATELET
BASOS: 1 %
Basophils Absolute: 0.1 10*3/uL (ref 0.0–0.2)
EOS (ABSOLUTE): 0.1 10*3/uL (ref 0.0–0.4)
EOS: 2 %
HEMATOCRIT: 39.7 % (ref 34.0–46.6)
Hemoglobin: 13.1 g/dL (ref 11.1–15.9)
IMMATURE GRANULOCYTES: 0 %
Immature Grans (Abs): 0 10*3/uL (ref 0.0–0.1)
LYMPHS ABS: 1.6 10*3/uL (ref 0.7–3.1)
Lymphs: 40 %
MCH: 30.5 pg (ref 26.6–33.0)
MCHC: 33 g/dL (ref 31.5–35.7)
MCV: 93 fL (ref 79–97)
MONOS ABS: 0.6 10*3/uL (ref 0.1–0.9)
Monocytes: 15 %
NEUTROS ABS: 1.7 10*3/uL (ref 1.4–7.0)
NEUTROS PCT: 42 %
Platelets: 377 10*3/uL (ref 150–379)
RBC: 4.29 x10E6/uL (ref 3.77–5.28)
RDW: 13.1 % (ref 12.3–15.4)
WBC: 4.1 10*3/uL (ref 3.4–10.8)

## 2015-10-19 LAB — URINALYSIS, ROUTINE W REFLEX MICROSCOPIC
BILIRUBIN UA: NEGATIVE
Glucose, UA: NEGATIVE
Ketones, UA: NEGATIVE
Nitrite, UA: NEGATIVE
PH UA: 7 (ref 5.0–7.5)
RBC, UA: NEGATIVE
Specific Gravity, UA: 1.026 (ref 1.005–1.030)
Urobilinogen, Ur: 1 mg/dL (ref 0.2–1.0)

## 2015-10-19 LAB — MICROSCOPIC EXAMINATION: CASTS: NONE SEEN /LPF

## 2015-10-19 LAB — TSH: TSH: 3.68 u[IU]/mL (ref 0.450–4.500)

## 2015-10-24 ENCOUNTER — Telehealth: Payer: Self-pay | Admitting: Neurology

## 2015-10-24 ENCOUNTER — Encounter: Payer: Self-pay | Admitting: *Deleted

## 2015-10-24 NOTE — Telephone Encounter (Signed)
Called Monday 5 pm and left message about lab results.   (JCV antibody is low positive; other labs are fine).   She was most interested in [place] but was also considering Tysabri as she had done well with that in the past. As she is JCV antibody positive, I would recommend Lemtrada. We had already gone over the risks and benefits.

## 2015-10-25 NOTE — Telephone Encounter (Signed)
We discussed her laboratory tests. She is JCV low positive (0.73).   CBC, TSH and CMP were essentially normal.   As she is at a mild risk of PML if she goes on Tysabri, she would prefer to go on [place]. She has stopped Tecfidera and we will try to see if we can get her infused in 4 or 5 weeks.  We will go ahead and send in the [place] form.

## 2015-10-26 NOTE — Telephone Encounter (Signed)
Lemtrada srf completed and given to [facility] in the infusion suite/fim

## 2015-11-02 ENCOUNTER — Ambulatory Visit (INDEPENDENT_AMBULATORY_CARE_PROVIDER_SITE_OTHER): Payer: 59

## 2015-11-02 DIAGNOSIS — G35 Multiple sclerosis: Secondary | ICD-10-CM | POA: Diagnosis not present

## 2015-11-03 ENCOUNTER — Telehealth: Payer: Self-pay | Admitting: *Deleted

## 2015-11-03 MED ORDER — GADOPENTETATE DIMEGLUMINE 469.01 MG/ML IV SOLN: 16.0000 mL | Freq: Once | INTRAVENOUS | Status: DC | PRN

## 2015-11-03 NOTE — Telephone Encounter (Signed)
-----   Message from Asa Lente, MD sent at 11/03/2015  1:28 PM EDT ----- Please let her know that the MRI of the brain does not show any changes compared to the one last year.

## 2015-11-03 NOTE — Telephone Encounter (Signed)
LMOM that per RAS, recent MRI shows no new changes when compared to previous MRI.  She does not need to return this call unless she has questions/fim

## 2015-11-03 NOTE — Telephone Encounter (Signed)
Patient called regarding Carrie Nelson treatment that she will be starting soon, needs to know if it's okay to receive flu shot while doing [place]. Please call [phone].

## 2015-11-04 NOTE — Telephone Encounter (Signed)
LMOM that I believe the rec. for flu shots with Julaine HuaLemtrada is that you receive the vaccine at least 6 weeks prior to infusion.  RAS is out of the office today.  I will double check this with him on Monday and call her with a definitive answer/fim

## 2015-11-07 NOTE — Telephone Encounter (Signed)
LMOM that per RAS, ok for flu shot 6 wks. prior to [place] infusions.  Also, I have completed her FMLA paperwork.  Her Lemtrada infusions have not been scheduled yet, so I am not able to but those dates down (she will need that time as a continuous leave from work), but I can go ahead and turn it in now and amend it later if she likes, or keep the paperwork until I have those dates.  What is her preference?

## 2015-11-09 ENCOUNTER — Encounter: Payer: Self-pay | Admitting: *Deleted

## 2015-11-09 NOTE — Telephone Encounter (Signed)
I have spoken with Jerniya this morning, and per her request, have faxed FMLA paperwork to [employer] fax# [phone].  I have also mailed a copy to her home address for her records.  Copy sent to be scanned into EPIC/fim

## 2015-11-09 NOTE — Telephone Encounter (Signed)
Pt called wanting to know about FMLA paperwork and she needs a flu shot note for work.

## 2015-11-09 NOTE — Telephone Encounter (Signed)
Per RAS, pt. should get flu shot now, even if it is only 4 wks prior to Lemtrada infusions.  He feels she will still receive benefit from the vaccine.  She verbalized understanding of same.  She [facility], advising she should get the vaccine asap.  Letter faxed to her at fax# [phone]/fim

## 2015-11-11 ENCOUNTER — Telehealth: Payer: Self-pay | Admitting: Neurology

## 2015-11-14 MED FILL — METFORMIN HCL ER 750 MG TAB: 750 | 30 days supply | Qty: 30 | Fill #2

## 2015-11-14 MED FILL — FLUoxetine HCL 10 MG CAPS: 10 | 30 days supply | Qty: 30 | Fill #0

## 2015-11-14 NOTE — Telephone Encounter (Signed)
error 

## 2015-12-08 NOTE — Telephone Encounter (Signed)
Carrie Nelson's Lemtrada infusions have been scheduled for Nov. 13-17, 2017.  I have amended her FMLA paperwork to allow continuous leave for dates Nov. 13-20, 2017.  (Monday to Monday).  I have faxed amended paperwork to [employer] at [phone].  Carrie Nelson aware this has been done/fim

## 2015-12-09 ENCOUNTER — Telehealth: Payer: Self-pay | Admitting: Neurology

## 2015-12-09 MED ORDER — METHYLPHENIDATE HCL ER (OSM) 36 MG PO TBCR: EXTENDED_RELEASE_TABLET | ORAL | 0 refills | Status: DC

## 2015-12-09 MED FILL — AZELASTINE HCL 137 MCG SPRY: 0.1 | 50 days supply | Qty: 30 | Fill #1 | Status: TO

## 2015-12-09 MED FILL — FLUTICASONE PROP 50 MCG SPR: 50 | 30 days supply | Qty: 16 | Fill #1 | Status: TO

## 2015-12-09 NOTE — Telephone Encounter (Addendum)
Patient called to request refill of methylphenidate 36 MG PO CR tablet and request 3 month Rx of this medication.

## 2015-12-09 NOTE — Telephone Encounter (Signed)
Rx. up front [facility]/fim 

## 2015-12-09 NOTE — Telephone Encounter (Signed)
Rx. awaiting RAS sig/fim 

## 2015-12-12 MED FILL — METFORMIN HCL ER 750 MG TAB: 750 | 30 days supply | Qty: 30 | Fill #3

## 2015-12-12 MED FILL — FLUoxetine HCL 10 MG CAPS: 10 | 30 days supply | Qty: 30 | Fill #1 | Status: TO

## 2015-12-14 MED FILL — METHYLPHENIDATE ER 36 MG TA: 36 | 45 days supply | Qty: 90 | Fill #0

## 2015-12-15 ENCOUNTER — Telehealth: Payer: Self-pay | Admitting: Neurology

## 2015-12-15 DIAGNOSIS — F419 Anxiety disorder, unspecified: Secondary | ICD-10-CM | POA: Diagnosis not present

## 2015-12-15 DIAGNOSIS — G35 Multiple sclerosis: Secondary | ICD-10-CM | POA: Diagnosis not present

## 2015-12-15 DIAGNOSIS — J4520 Mild intermittent asthma, uncomplicated: Secondary | ICD-10-CM | POA: Diagnosis not present

## 2015-12-15 DIAGNOSIS — E034 Atrophy of thyroid (acquired): Secondary | ICD-10-CM | POA: Diagnosis not present

## 2015-12-15 NOTE — Telephone Encounter (Signed)
Pt called to advise she is having new symptoms on the rt side of torse, rt hip and rt thigh- painful burning and cold sensation to the touch. Please call  No emergency

## 2015-12-15 NOTE — Telephone Encounter (Signed)
I have spoken with Carrie Nelson this afternoon.  She c/o hypersensitivy, cold/burning sensation to her right hip, right thigh, right side of torso over the last couple of days. Some relief with otc Ibuprofen.  She does currently have a sinus infection and is  on antibiotics.  Per RAS, this could possibly be related to MS.  If if persists or worsens, he would start her on Gabapentin 300-300-600.  Carrie Nelson verbalized understanding of same; will call back if needed/fim

## 2016-01-09 ENCOUNTER — Other Ambulatory Visit: Payer: Self-pay | Admitting: Neurology

## 2016-01-09 DIAGNOSIS — G35 Multiple sclerosis: Secondary | ICD-10-CM | POA: Diagnosis not present

## 2016-01-09 MED ORDER — VALACYCLOVIR HCL 500 MG PO TABS: 500.0000 mg | ORAL_TABLET | Freq: Two times a day (BID) | ORAL | 3 refills | Status: DC

## 2016-01-09 MED ORDER — ZOLPIDEM TARTRATE 5 MG PO TABS: 5.0000 mg | ORAL_TABLET | Freq: Every evening | ORAL | 0 refills | Status: DC | PRN

## 2016-01-10 DIAGNOSIS — G35 Multiple sclerosis: Secondary | ICD-10-CM | POA: Diagnosis not present

## 2016-01-11 DIAGNOSIS — G35 Multiple sclerosis: Secondary | ICD-10-CM | POA: Diagnosis not present

## 2016-01-12 DIAGNOSIS — G35 Multiple sclerosis: Secondary | ICD-10-CM | POA: Diagnosis not present

## 2016-01-13 DIAGNOSIS — G35 Multiple sclerosis: Secondary | ICD-10-CM | POA: Diagnosis not present

## 2016-01-23 MED FILL — METFORMIN HCL ER 750 MG TAB: 750 | 30 days supply | Qty: 30 | Fill #4

## 2016-01-30 ENCOUNTER — Other Ambulatory Visit: Payer: Self-pay | Admitting: Obstetrics and Gynecology

## 2016-02-01 ENCOUNTER — Ambulatory Visit (INDEPENDENT_AMBULATORY_CARE_PROVIDER_SITE_OTHER): Payer: 59 | Admitting: Neurology

## 2016-02-01 ENCOUNTER — Encounter: Payer: Self-pay | Admitting: Neurology

## 2016-02-01 VITALS — BP 126/62 | HR 68 | Resp 16 | Ht 62.0 in | Wt 165.8 lb

## 2016-02-01 DIAGNOSIS — F988 Other specified behavioral and emotional disorders with onset usually occurring in childhood and adolescence: Secondary | ICD-10-CM | POA: Diagnosis not present

## 2016-02-01 DIAGNOSIS — Z79899 Other long term (current) drug therapy: Secondary | ICD-10-CM | POA: Diagnosis not present

## 2016-02-01 DIAGNOSIS — G35 Multiple sclerosis: Secondary | ICD-10-CM | POA: Diagnosis not present

## 2016-02-01 DIAGNOSIS — R5383 Other fatigue: Secondary | ICD-10-CM

## 2016-02-01 MED ORDER — METHYLPHENIDATE HCL ER (OSM) 36 MG PO TBCR: EXTENDED_RELEASE_TABLET | ORAL | 0 refills | Status: DC

## 2016-02-01 NOTE — Progress Notes (Signed)
[facility]  PATIENT: Carrie Nelson DOB: 02-Aug-1980  REFERRING CLINICIAN: Daniel Nones HISTORY FROM: Patient   REASON FOR VISIT: Multiple Sclerosis   HISTORICAL  CHIEF COMPLAINT:  Chief Complaint  Patient presents with  . Multiple Sclerosis    She just completed first yr. Lemtrada infusions a few weeks ago.  Sts. she feels really well.  Sts. still has some problem with stress incontinence/fim                                               f  HISTORY OF PRESENT ILLNESS:  Carrie Nelson is a 35 year old woman who was diagnosed with MS in 2007.  She had her 1st 5 day course of Lemtrada in one month.   She had chills the first day and some headaches but has felt better than usual the past 2 weeks. She felt slightly weaker the next week and mild facial numbness.      She feels more energetic now.    Gait/strength/sensation:    She notes that the right leg is dragging at time especially if tired.    There is mild right leg weakness, noticeable with stair climbing.   She has mild right foot sensation changes that are not bothersome.     Bladder function is fine.              Vision is fine.     Fatigue/sleep/cognition:     She notes fatigue but is much better with Concerta 36 mg.   She is less  sleepy in the afternoons now.   She sleeps well at night.     She notes that her decreased focus is also better.    She denies memory problems.     Mood:   She denies any depression.   She notes mild anxiety helped by Prozac 10 mg   MS History:   She was diagnosed in 2007 but,  in retrospect, had right foot dragging that started around 2005. Then shortly after that she had an episode of left facial numbness. In 2007 she had an episode of slurred speech and worsening clumsiness.  MRI of the brain that was consistent with multiple sclerosis. She also had a lumbar puncture and her CSF compatible with the diagnosis of MS. Most of her symptoms improved over the next few months but she did not get 100% back to baseline and she continues to have minimal residual weakness, clumsiness and numbness. She was initially placed on Betaseron but switched to Tysabri because of some generalized side effects of Betaseron. She was seeing Dr. Tinnie GensJeffrey 2007 until 2014.   She was on Tysabri between 2008 and late 2014 when she stopped because of a desire to get pregnant. She became pregnant in early 2015 and delivered in November 2015. She had not been on any disease modifying therapy since late 2014 and then started Tecfidera 02/2014.Marland Kitchen.   She did not tolerate it well [facility]     REVIEW OF SYSTEMS:  Constitutional: No fevers, chills, sweats, or change in appetite Eyes: No visual changes, double vision, eye pain Ear, nose and throat: No hearing loss, ear pain, nasal congestion, sore throat Cardiovascular: No chest pain, palpitations Respiratory:  No shortness of breath at rest or with exertion.   No wheezes GastrointestinaI: No nausea, vomiting, diarrhea, abdominal pain, fecal incontinence Genitourinary:  No dysuria, urinary retention or frequency.  No nocturia. Musculoskeletal:  No neck pain, back pain Integumentary: No rash, pruritus, skin lesions Neurological: as above Psychiatric: No depression at  this time.  No anxiety.   Occasionally she has irritability. Endocrine: She she is borderline diabetic. No palpitations, diaphoresis, change in appetite, change in weigh or increased thirst Hematologic/Lymphatic:  No anemia, purpura, petechiae. Allergic/Immunologic: She has seasonal allergies and is treated with several medications.  ALLERGIES: No Known Allergies  HOME MEDICATIONS: Outpatient Medications Prior to Visit  Medication Sig Dispense Refill  . Alemtuzumab (LEMTRADA) 12 MG/1.2ML SOLN Inject 12 mg into the vein.    Marland Kitchen. azelastine (ASTELIN) 0.1 % nasal spray  7  . Azelastine-Fluticasone (DYMISTA) 137-50 MCG/ACT SUSP 1 spray by Nasal route 2 (two) times daily.    . Cetirizine HCl (ZYRTEC ALLERGY) 10 MG CAPS Take 10 mg by mouth daily.    . cholecalciferol (VITAMIN D) 1000 UNITS tablet Take 2,000 Units by mouth daily.     . fexofenadine (ALLEGRA) 180 MG tablet Take by mouth.    Marland Kitchen FLUoxetine (PROZAC) 10 MG capsule TAKE 1 CAPSULE BY MOUTH ONCE DAILY 30 capsule 4  . LARIN 1.5/30 1.5-30 MG-MCG tablet TAKE 1 TABLET BY MOUTH DAILY 63 tablet 5  . linaclotide (LINZESS) 72 MCG capsule Take 1 capsule (72 mcg total) by mouth daily before breakfast. 30 capsule 6  . metFORMIN (GLUCOPHAGE XR) 750 MG 24 hr tablet Take 1 tablet (750 mg total) by mouth daily with breakfast. 30 tablet 6  . methylphenidate 36 MG PO CR tablet Take one to two tablets daily as needed. 90 tablet 0  . montelukast (SINGULAIR) 10 MG tablet Take 10 mg by mouth at bedtime.    . Multiple Vitamins-Minerals (MULTIVITAMIN PO) Take by mouth.    . NORETHINDRONE PO Take by mouth daily.    Marland Kitchen SYNTHROID 25 MCG tablet TAKE 1 TABLET BY MOUTH DAILY 90 tablet 4  . fluticasone (FLONASE) 50 MCG/ACT nasal spray Place into the nose.    . valACYclovir (VALTREX) 500 MG tablet Take 1 tablet (500 mg total) by mouth 2 (two) times daily. 60 tablet 3  . zolpidem (AMBIEN) 5 MG tablet Take 1 tablet (5 mg total) by mouth at bedtime as needed for sleep. 10  tablet 0  . Dimethyl Fumarate 240 MG CPDR Take 1 capsule (240 mg total) by mouth 2 (two) times daily. 180 capsule 3   Facility-Administered Medications Prior to Visit  Medication Dose Route Frequency Provider Last Rate Last Dose  . gadopentetate dimeglumine (MAGNEVIST) injection 16 mL  16 mL Intravenous Once PRN Asa Lente, MD        PAST MEDICAL HISTORY: Past Medical History:  Diagnosis Date  . Multiple sclerosis (HCC)   . Syncope   . Thyroid disease   . Tremor     PAST SURGICAL HISTORY: Past Surgical History:  Procedure Laterality Date  . BUNIONECTOMY    . FOOT SURGERY    . KNEE ARTHROSCOPY W/ ACL RECONSTRUCTION Left 2010    FAMILY HISTORY: Family History  Problem Relation Age of Onset  . Prostate cancer Father   . Diabetes Father   . Arthritis    . Dementia    . Hypertension    . Heart disease      SOCIAL HISTORY:  Social History   Social History  . Marital status: Married    Spouse name: N/A  . Number of children: o  . Years of education: N/A   Occupational History  . Not on file.   Social History Main Topics  . Smoking status: Never Smoker  . Smokeless tobacco: Never Used  . Alcohol use Yes     Comment: minimal weekly   . Drug use: No  . Sexual activity: Yes    Birth control/ protection: Pill   Other Topics Concern  . Not on file   Social History Narrative   Patient lives with parents.   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    Patient has a Probation officer.      PHYSICAL EXAM  Vitals:   02/01/16 1311  BP: 126/62  Pulse: 68  Resp: 16  Weight: 165 lb 12.8 oz (75.2 kg)  Height: 5\' 2"  (1.575 m)    Body mass index is 30.33 kg/m.   General: The patient is well-developed and well-nourished and in no acute distress   Neurologic Exam  Mental status: The patient is alert and oriented x 3 at the time of the examination. The patient has apparent normal recent and  remote memory, with an apparently normal attention span and concentration ability.   Speech is normal.  Cranial nerves: Extraocular movements are full.   Facial symmetry is present. There is good facial sensation to soft touch bilaterally.Facial strength is normal.  Trapezius and sternocleidomastoid strength is normal. No dysarthria is noted.   No obvious hearing deficits are noted.  Motor:  Muscle bulk and tone are normal. Strength is  5 / 5 in all 4 extremities.   Sensory: Sensory testing is intact to temperature, soft touch, vibration sensation, and position sense on all 4 extremities.  Coordination: Cerebellar testing reveals good finger-nose-finger and heel-to-shin bilaterally.  Gait and station: Station and gait are normal. Tandem gait is very minimally wide. Romberg is negative.   Reflexes: Deep tendon reflexes are symmetric and normal bilaterally.      DIAGNOSTIC DATA (LABS, IMAGING, TESTING) - I reviewed patient records, labs, notes, testing and imaging myself where available.  Lab Results  Component Value Date   WBC 4.1 10/18/2015   HGB 13.0 03/09/2014   HCT 39.7 10/18/2015   MCV 93 10/18/2015   PLT 377 10/18/2015      Component Value Date/Time   NA 141 10/18/2015 1615   NA 138 05/09/2012 0830   K 4.3 10/18/2015 1615   K 3.7 05/09/2012 0830   CL 101 10/18/2015 1615   CL 108 (H) 05/09/2012 0830   CO2 23 10/18/2015 1615   CO2 23 05/09/2012 0830   GLUCOSE 102 (H) 10/18/2015 1615   GLUCOSE 84 05/09/2012 0830   BUN 13 10/18/2015 1615   BUN 16 05/09/2012 0830   CREATININE 0.62 10/18/2015 1615   CREATININE 0.95 05/09/2012 0830   CALCIUM 9.7 10/18/2015 1615   CALCIUM 8.6 05/09/2012 0830   PROT 6.7 10/18/2015 1615   PROT 6.7 05/09/2012 0830   ALBUMIN 4.2 10/18/2015 1615   ALBUMIN 3.4 05/09/2012 0830   AST 12 10/18/2015 1615   AST 17 05/09/2012 0830   ALT 9 10/18/2015 1615   ALT 16 05/09/2012 0830   ALKPHOS 43 10/18/2015 1615   ALKPHOS 55 05/09/2012 0830    BILITOT 0.3 10/18/2015 1615   BILITOT 0.7 05/09/2012 0830   GFRNONAA 116 10/18/2015 1615   GFRNONAA >60 05/09/2012 0830   GFRAA 134 10/18/2015 1615   GFRAA >60 05/09/2012 0830        ASSESSMENT AND PLAN  No diagnosis found.   1.  She has completed numb trauma and will be on the REMS program's for more years. We will do her second year dose of Lemtrada over 3 days in about 11 months.  2.  Remain active and exercises as tolerated. 3.  Continue Concerta 36 mg one or two daily .   New script 4.   Return in 6 months or call sooner if new or worsening neurologic symptoms.    Bridgitt Raggio A. Epimenio Foot, MD, PhD 02/01/2016, 1:17 PM Certified in Neurology, Clinical Neurophysiology, Sleep Medicine, Pain Medicine and Neuroimaging  [facility] [address], [address] [place], [place] [address] [phone]-

## 2016-03-05 MED FILL — METFORMIN HCL ER 750 MG TAB: 750 | 30 days supply | Qty: 30 | Fill #5

## 2016-03-16 ENCOUNTER — Ambulatory Visit: Payer: Self-pay | Admitting: Physician Assistant

## 2016-03-16 ENCOUNTER — Encounter: Payer: Self-pay | Admitting: Physician Assistant

## 2016-03-16 VITALS — BP 120/70 | HR 89 | Temp 98.4°F

## 2016-03-16 DIAGNOSIS — J209 Acute bronchitis, unspecified: Secondary | ICD-10-CM

## 2016-03-16 MED ORDER — AZITHROMYCIN 500 MG PO TABS: 500.0000 mg | ORAL_TABLET | Freq: Every day | ORAL | 0 refills | Status: DC

## 2016-03-16 MED ORDER — PSEUDOEPH-BROMPHEN-DM 30-2-10 MG/5ML PO SYRP: 5.0000 mL | ORAL_SOLUTION | Freq: Four times a day (QID) | ORAL | 0 refills | Status: DC | PRN

## 2016-03-16 NOTE — Addendum Note (Signed)
Addended by: Joni Reining on: 03/16/2016 04:54 PM   Modules accepted: Orders

## 2016-03-16 NOTE — Progress Notes (Signed)
   Subjective:URI    Patient ID: Carrie Nelson, female    DOB: 1980-09-09, 36 y.o.   MRN: [id number]  HPI Patient c/o productive cough and chest congestion for one week. States sputum is green and thick. Patient recent had immune suppression therapy for MS. No palliative measure for compliant.   Review of Systems    MS Objective:   Physical Exam HEENT unremarkable. Neck supple, Lung with rales, Heart RRR.       Assessment & Plan:Bronchitis  Z-pack and Bromfed DM.  Follow up with family Doctor.

## 2016-03-27 ENCOUNTER — Encounter: Payer: Self-pay | Admitting: Obstetrics and Gynecology

## 2016-03-29 ENCOUNTER — Telehealth: Payer: Self-pay | Admitting: Neurology

## 2016-03-29 NOTE — Telephone Encounter (Signed)
LMOM to let her know I got her message, will check with RAS and call her back tomorrow/fim

## 2016-03-29 NOTE — Telephone Encounter (Signed)
Pt says she's had insurance change, it will not pay for methylphenidate 36 MG PO CR tablet . Insurance is still [employer], it will cover: adderall XR  nedd to be careful about the qty per pharmacist @ [place] nuvigil provigil vyvanse - she has to try the others 1st-this is second step if all others fail Please call

## 2016-03-30 NOTE — Telephone Encounter (Signed)
Patient calling back to remind you to return call to her today.

## 2016-03-30 NOTE — Telephone Encounter (Signed)
PA for Methylphenidate 36mg  30/30 completed by phone with [employer] (phone# [phone]). Pt. has tried and failed Ritalin 10mg , Methylphenidate 18mg , Phentermine 37.5mg . She has been stable on Methylphenidate 36mg  for about 2 yrs for dx. of ADHD (F90.9).  She works full time, and a switch in medications at this time would put her at risk for poor job performance/loss of employment/fim

## 2016-04-02 ENCOUNTER — Encounter (INDEPENDENT_AMBULATORY_CARE_PROVIDER_SITE_OTHER): Payer: Self-pay | Admitting: Vascular Surgery

## 2016-04-02 ENCOUNTER — Ambulatory Visit (INDEPENDENT_AMBULATORY_CARE_PROVIDER_SITE_OTHER): Payer: 59 | Admitting: Vascular Surgery

## 2016-04-02 ENCOUNTER — Other Ambulatory Visit: Payer: Self-pay | Admitting: Obstetrics and Gynecology

## 2016-04-02 VITALS — BP 115/69 | HR 71 | Resp 16 | Ht 62.5 in | Wt 166.0 lb

## 2016-04-02 DIAGNOSIS — I8312 Varicose veins of left lower extremity with inflammation: Secondary | ICD-10-CM

## 2016-04-02 DIAGNOSIS — M79605 Pain in left leg: Secondary | ICD-10-CM | POA: Diagnosis not present

## 2016-04-02 DIAGNOSIS — E119 Type 2 diabetes mellitus without complications: Secondary | ICD-10-CM | POA: Diagnosis not present

## 2016-04-02 DIAGNOSIS — M79609 Pain in unspecified limb: Secondary | ICD-10-CM | POA: Insufficient documentation

## 2016-04-02 DIAGNOSIS — I8311 Varicose veins of right lower extremity with inflammation: Secondary | ICD-10-CM | POA: Diagnosis not present

## 2016-04-02 DIAGNOSIS — G35 Multiple sclerosis: Secondary | ICD-10-CM | POA: Diagnosis not present

## 2016-04-02 MED FILL — METFORMIN HCL ER 750 MG TAB: 750 | 30 days supply | Qty: 30 | Fill #0

## 2016-04-02 NOTE — Progress Notes (Signed)
MRN : [id number]  Carrie Nelson is a 36 y.o. (09-25-1980) female who presents with chief complaint of  Chief Complaint  Patient presents with  . New Patient (Initial Visit)  .  History of Present Illness: The patient is seen for evaluation of symptomatic varicose veins. The patient relates burning and stinging which worsened steadily throughout the course of the day, particularly with standing. The patient also notes an aching and throbbing pain over the varicosities, particularly with prolonged dependent positions. The symptoms are significantly improved with elevation.  The patient also notes that during hot weather the symptoms are greatly intensified. The patient states the pain from the varicose veins interferes with work, daily exercise, shopping and household maintenance. At this point, the symptoms are persistent and severe enough that they're having a negative impact on lifestyle and are interfering with daily activities.  There is no history of DVT, PE or superficial thrombophlebitis. There is no history of ulceration or hemorrhage. The patient denies a significant family history of varicose veins.  The patient has not worn graduated compression in the past. At the present time the patient has not been using over-the-counter analgesics. There is no history of prior surgical intervention or sclerotherapy.    Current Meds  Medication Sig  . Alemtuzumab (LEMTRADA) 12 MG/1.2ML SOLN Inject 12 mg into the vein.  Marland Kitchen azelastine (ASTELIN) 0.1 % nasal spray   . Azelastine-Fluticasone (DYMISTA) 137-50 MCG/ACT SUSP 1 spray by Nasal route 2 (two) times daily.  Marland Kitchen azithromycin (ZITHROMAX) 500 MG tablet Take 1 tablet (500 mg total) by mouth daily. Take 1 tablet daily for 3 days.  . brompheniramine-pseudoephedrine-DM 30-2-10 MG/5ML syrup Take 5 mLs by mouth 4 (four) times daily as needed.  . Cetirizine HCl (ZYRTEC ALLERGY) 10 MG CAPS Take 10 mg by mouth daily.  . cholecalciferol (VITAMIN D)  1000 UNITS tablet Take 2,000 Units by mouth daily.   . fexofenadine (ALLEGRA) 180 MG tablet Take by mouth.  Marland Kitchen FLUoxetine (PROZAC) 10 MG capsule TAKE 1 CAPSULE BY MOUTH ONCE DAILY  . LARIN 1.5/30 1.5-30 MG-MCG tablet TAKE 1 TABLET BY MOUTH DAILY  . linaclotide (LINZESS) 72 MCG capsule Take 1 capsule (72 mcg total) by mouth daily before breakfast.  . metFORMIN (GLUCOPHAGE XR) 750 MG 24 hr tablet Take 1 tablet (750 mg total) by mouth daily with breakfast.  . methylphenidate 36 MG PO CR tablet Take one to two tablets daily as needed.  . montelukast (SINGULAIR) 10 MG tablet Take 10 mg by mouth at bedtime.  . Multiple Vitamins-Minerals (MULTIVITAMIN PO) Take by mouth.  . NORETHINDRONE PO Take by mouth daily.  Marland Kitchen SYNTHROID 25 MCG tablet TAKE 1 TABLET BY MOUTH DAILY  . valACYclovir (VALTREX) 500 MG tablet Take 1 tablet (500 mg total) by mouth 2 (two) times daily.  Marland Kitchen zolpidem (AMBIEN) 5 MG tablet Take 1 tablet (5 mg total) by mouth at bedtime as needed for sleep.    Past Medical History:  Diagnosis Date  . Multiple sclerosis (HCC)   . Syncope   . Thyroid disease   . Tremor     Past Surgical History:  Procedure Laterality Date  . BUNIONECTOMY    . FOOT SURGERY    . KNEE ARTHROSCOPY W/ ACL RECONSTRUCTION Left 2010    Social History Social History  Substance Use Topics  . Smoking status: Never Smoker  . Smokeless tobacco: Never Used  . Alcohol use Yes     Comment: minimal weekly  Family History Family History  Problem Relation Age of Onset  . Prostate cancer Father   . Diabetes Father   . Arthritis    . Dementia    . Hypertension    . Heart disease    No family history of bleeding/clotting disorders, porphyria or autoimmune disease   No Known Allergies   REVIEW OF SYSTEMS (Negative unless checked)  Constitutional: [] Weight loss  [] Fever  [] Chills Cardiac: [] Chest pain   [] Chest pressure   [] Palpitations   [] Shortness of breath when laying flat   [] Shortness of breath  with exertion. Vascular:  [] Pain in legs with walking   [] Pain in legs at rest  [] History of DVT   [] Phlebitis   [x] Swelling in legs   [x] Varicose veins   [] Non-healing ulcers Pulmonary:   [] Uses home oxygen   [] Productive cough   [] Hemoptysis   [] Wheeze  [] COPD   [] Asthma Neurologic:  [] Dizziness   [] Seizures   [] History of stroke   [] History of TIA  [] Aphasia   [] Vissual changes   [] Weakness or numbness in arm   [] Weakness or numbness in leg Musculoskeletal:   [] Joint swelling   [] Joint pain   [] Low back pain Hematologic:  [] Easy bruising  [] Easy bleeding   [] Hypercoagulable state   [] Anemic Gastrointestinal:  [] Diarrhea   [] Vomiting  [] Gastroesophageal reflux/heartburn   [] Difficulty swallowing. Genitourinary:  [] Chronic kidney disease   [] Difficult urination  [] Frequent urination   [] Blood in urine Skin:  [] Rashes   [] Ulcers  Psychological:  [] History of anxiety   []  History of major depression.  Physical Examination  Vitals:   04/02/16 1025  BP: 115/69  Pulse: 71  Resp: 16  Weight: 166 lb (75.3 kg)  Height: 5' 2.5" (1.588 m)   Body mass index is 29.88 kg/m. Gen: WD/WN, NAD Head: [place]/AT, No temporalis wasting.  Ear/Nose/Throat: Hearing grossly intact, nares w/o erythema or drainage, poor dentition Eyes: PER, EOMI, sclera nonicteric.  Neck: Supple, no masses.  No bruit or JVD.  Pulmonary:  Good air movement, clear to auscultation bilaterally, no use of accessory muscles.  Cardiac: RRR, normal S1, S2, no Murmurs. Vascular:  Large >8 mm varicosities left leg with mild venous stasis dermatitis  Vessel Right Left  Radial Palpable Palpable  Ulnar Palpable Palpable  Brachial Palpable Palpable  Carotid Palpable Palpable  Femoral Palpable Palpable  Popliteal Palpable Palpable  PT Palpable Palpable  DP Palpable Palpable  Gastrointestinal: soft, non-distended. No guarding/no peritoneal signs.  Musculoskeletal: M/S 5/5 throughout.  No deformity or atrophy.  Neurologic: CN 2-12  intact. Pain and light touch intact in extremities.  Symmetrical.  Speech is fluent. Motor exam as listed above. Psychiatric: Judgment intact, Mood & affect appropriate for pt's clinical situation. Dermatologic: No rashes or ulcers noted.  No changes consistent with cellulitis. Lymph : No Cervical lymphadenopathy, no lichenification or skin changes of chronic lymphedema.  CBC Lab Results  Component Value Date   WBC 4.1 10/18/2015   HGB 13.0 03/09/2014   HCT 39.7 10/18/2015   MCV 93 10/18/2015   PLT 377 10/18/2015    BMET    Component Value Date/Time   NA 141 10/18/2015 1615   NA 138 05/09/2012 0830   K 4.3 10/18/2015 1615   K 3.7 05/09/2012 0830   CL 101 10/18/2015 1615   CL 108 (H) 05/09/2012 0830   CO2 23 10/18/2015 1615   CO2 23 05/09/2012 0830   GLUCOSE 102 (H) 10/18/2015 1615   GLUCOSE 84 05/09/2012 0830   BUN 13  10/18/2015 1615   BUN 16 05/09/2012 0830   CREATININE 0.62 10/18/2015 1615   CREATININE 0.95 05/09/2012 0830   CALCIUM 9.7 10/18/2015 1615   CALCIUM 8.6 05/09/2012 0830   GFRNONAA 116 10/18/2015 1615   GFRNONAA >60 05/09/2012 0830   GFRAA 134 10/18/2015 1615   GFRAA >60 05/09/2012 0830   CrCl cannot be calculated (Patient's most recent lab result is older than the maximum 21 days allowed.).  COAG No results found for: INR, PROTIME  Radiology No results found.  Assessment/Plan 1. Varicose veins of both lower extremities with inflammation  Recommend:  The patient has large symptomatic varicose veins that are painful and associated with swelling.  I have had a long discussion with the patient regarding  varicose veins and why they cause symptoms.  Patient will begin wearing graduated compression stockings class 1 on a daily basis, beginning first thing in the morning and removing them in the evening. The patient is instructed specifically not to sleep in the stockings.    The patient  will also begin using over-the-counter analgesics such as Motrin  600 mg po TID to help control the symptoms.    In addition, behavioral modification including elevation during the day will be initiated.    Pending the results of these changes the  patient will be reevaluated in three months.   An  ultrasound of the left venous system will be obtained.   Further plans will be based on the ultrasound results and whether conservative therapies are successful at eliminating the pain and swelling.   2. Pain of left lower extremity See #1  3. Multiple sclerosis (HCC) Continue medications as ordered  Changes per Neurology  4. Type 2 diabetes mellitus without complication, without long-term current use of insulin (HCC) Continue hypoglycemic medications as already ordered, these medications have been reviewed and there are no changes at this time.  Hgb A1C to be monitored as already arranged by primary service   Levora Dredge, MD  04/02/2016 11:05 AM

## 2016-04-04 MED FILL — CONCERTA 36 MG TABLET ER: 36 | 45 days supply | Qty: 90 | Fill #0

## 2016-04-04 NOTE — Telephone Encounter (Signed)
Fax received from [employer] (phone# [phone]).  Request for Methylphenidate ER 36mg  denied, but approved for brand name Concerta 36mg , up to 2 tablets daily.  Effective 04-03-16 thru 04-02-17.  Pt. aware and copy of brand name approval faxed to [facility]/fim

## 2016-04-04 NOTE — Telephone Encounter (Signed)
Auth# for below brand name approval is [id number]/fim

## 2016-05-07 MED FILL — METFORMIN HCL ER 750 MG TAB: 750 | 30 days supply | Qty: 30 | Fill #1

## 2016-05-10 ENCOUNTER — Encounter (INDEPENDENT_AMBULATORY_CARE_PROVIDER_SITE_OTHER): Payer: Self-pay | Admitting: Vascular Surgery

## 2016-05-10 ENCOUNTER — Ambulatory Visit (INDEPENDENT_AMBULATORY_CARE_PROVIDER_SITE_OTHER): Payer: 59 | Admitting: Vascular Surgery

## 2016-05-10 ENCOUNTER — Ambulatory Visit (INDEPENDENT_AMBULATORY_CARE_PROVIDER_SITE_OTHER): Payer: 59

## 2016-05-10 VITALS — BP 132/77 | HR 86 | Resp 17 | Wt 171.0 lb

## 2016-05-10 DIAGNOSIS — I8312 Varicose veins of left lower extremity with inflammation: Secondary | ICD-10-CM

## 2016-05-10 DIAGNOSIS — I872 Venous insufficiency (chronic) (peripheral): Secondary | ICD-10-CM | POA: Diagnosis not present

## 2016-05-10 DIAGNOSIS — I8311 Varicose veins of right lower extremity with inflammation: Secondary | ICD-10-CM | POA: Diagnosis not present

## 2016-05-10 DIAGNOSIS — M79605 Pain in left leg: Secondary | ICD-10-CM

## 2016-05-10 DIAGNOSIS — E119 Type 2 diabetes mellitus without complications: Secondary | ICD-10-CM

## 2016-05-10 NOTE — Progress Notes (Signed)
MRN : [id number]  Carrie Nelson is a 36 y.o. (1980-04-29) female who presents with chief complaint of  Chief Complaint  Patient presents with  . Follow-up  .  History of Present Illness: The patient returns for followup evaluation 3 months after the initial visit. The patient continues to have pain in the lower extremities with dependency. The pain is lessened with elevation. Graduated compression stockings, Class I (20-30 mmHg), have been worn but the stockings do not eliminate the leg pain. Over-the-counter analgesics do not improve the symptoms. The degree of discomfort continues to interfere with daily activities. The patient notes the pain in the legs is causing problems with daily exercise, at the workplace and even with household activities and maintenance such as standing in the kitchen preparing meals and doing dishes.   Venous ultrasound shows normal deep venous system, no evidence of acute or chronic DVT.  Superficial reflux is not present   Current Meds  Medication Sig  . Alemtuzumab (LEMTRADA) 12 MG/1.2ML SOLN Inject 12 mg into the vein.  Marland Kitchen azelastine (ASTELIN) 0.1 % nasal spray   . Azelastine-Fluticasone (DYMISTA) 137-50 MCG/ACT SUSP 1 spray by Nasal route 2 (two) times daily.  Marland Kitchen azithromycin (ZITHROMAX) 500 MG tablet Take 1 tablet (500 mg total) by mouth daily. Take 1 tablet daily for 3 days.  . brompheniramine-pseudoephedrine-DM 30-2-10 MG/5ML syrup Take 5 mLs by mouth 4 (four) times daily as needed.  . Cetirizine HCl (ZYRTEC ALLERGY) 10 MG CAPS Take 10 mg by mouth daily.  . cholecalciferol (VITAMIN D) 1000 UNITS tablet Take 2,000 Units by mouth daily.   . fexofenadine (ALLEGRA) 180 MG tablet Take by mouth.  Marland Kitchen FLUoxetine (PROZAC) 10 MG capsule TAKE 1 CAPSULE BY MOUTH ONCE DAILY  . LARIN 1.5/30 1.5-30 MG-MCG tablet TAKE 1 TABLET BY MOUTH DAILY  . linaclotide (LINZESS) 72 MCG capsule Take 1 capsule (72 mcg total) by mouth daily before breakfast.  . metFORMIN  (GLUCOPHAGE-XR) 750 MG 24 hr tablet TAKE 1 TABLET BY MOUTH DAILY WITH BREAKFAST.  . methylphenidate 36 MG PO CR tablet Take one to two tablets daily as needed.  . montelukast (SINGULAIR) 10 MG tablet Take 10 mg by mouth at bedtime.  . Multiple Vitamins-Minerals (MULTIVITAMIN PO) Take by mouth.  . NORETHINDRONE PO Take by mouth daily.  Marland Kitchen SYNTHROID 25 MCG tablet TAKE 1 TABLET BY MOUTH DAILY  . valACYclovir (VALTREX) 500 MG tablet Take 1 tablet (500 mg total) by mouth 2 (two) times daily.  Marland Kitchen zolpidem (AMBIEN) 5 MG tablet Take 1 tablet (5 mg total) by mouth at bedtime as needed for sleep.    Past Medical History:  Diagnosis Date  . Multiple sclerosis (HCC)   . Syncope   . Thyroid disease   . Tremor     Past Surgical History:  Procedure Laterality Date  . BUNIONECTOMY    . FOOT SURGERY    . KNEE ARTHROSCOPY W/ ACL RECONSTRUCTION Left 2010    Social History Social History  Substance Use Topics  . Smoking status: Never Smoker  . Smokeless tobacco: Never Used  . Alcohol use Yes     Comment: minimal weekly     Family History Family History  Problem Relation Age of Onset  . Prostate cancer Father   . Diabetes Father   . Arthritis    . Dementia    . Hypertension    . Heart disease    No family history of bleeding/clotting disorders, porphyria or autoimmune disease  No Known Allergies   REVIEW OF SYSTEMS (Negative unless checked)  Constitutional: [] Weight loss  [] Fever  [] Chills Cardiac: [] Chest pain   [] Chest pressure   [] Palpitations   [] Shortness of breath when laying flat   [] Shortness of breath with exertion. Vascular:  [] Pain in legs with walking   [] Pain in legs at rest  [] History of DVT   [] Phlebitis   [x] Swelling in legs   [x] Varicose veins   [] Non-healing ulcers Pulmonary:   [] Uses home oxygen   [] Productive cough   [] Hemoptysis   [] Wheeze  [] COPD   [] Asthma Neurologic:  [] Dizziness   [] Seizures   [] History of stroke   [] History of TIA  [] Aphasia   [] Vissual  changes   [] Weakness or numbness in arm   [] Weakness or numbness in leg Musculoskeletal:   [] Joint swelling   [] Joint pain   [] Low back pain Hematologic:  [] Easy bruising  [] Easy bleeding   [] Hypercoagulable state   [] Anemic Gastrointestinal:  [] Diarrhea   [] Vomiting  [] Gastroesophageal reflux/heartburn   [] Difficulty swallowing. Genitourinary:  [] Chronic kidney disease   [] Difficult urination  [] Frequent urination   [] Blood in urine Skin:  [] Rashes   [] Ulcers  Psychological:  [] History of anxiety   []  History of major depression.  Physical Examination  Vitals:   05/10/16 1542  BP: 132/77  Pulse: 86  Resp: 17  Weight: 171 lb (77.6 kg)   Body mass index is 30.78 kg/m. Gen: WD/WN, NAD Head: [place]/AT, No temporalis wasting.  Ear/Nose/Throat: Hearing grossly intact, nares w/o erythema or drainage, poor dentition Eyes: PER, EOMI, sclera nonicteric.  Neck: Supple, no masses.  No bruit or JVD.  Pulmonary:  Good air movement, clear to auscultation bilaterally, no use of accessory muscles.  Cardiac: RRR, normal S1, S2, no Murmurs. Vascular: scattered small varicose veins Vessel Right Left  Radial Palpable Palpable  Ulnar Palpable Palpable  Brachial Palpable Palpable  Carotid Palpable Palpable  Femoral Palpable Palpable  Popliteal Palpable Palpable  PT Palpable Palpable  DP Palpable Palpable   Gastrointestinal: soft, non-distended. No guarding/no peritoneal signs.  Musculoskeletal: M/S 5/5 throughout.  No deformity or atrophy.  Neurologic: CN 2-12 intact. Pain and light touch intact in extremities.  Symmetrical.  Speech is fluent. Motor exam as listed above. Psychiatric: Judgment intact, Mood & affect appropriate for pt's clinical situation. Dermatologic: No rashes or ulcers noted.  No changes consistent with cellulitis. Lymph : No Cervical lymphadenopathy, no lichenification or skin changes of chronic lymphedema.  CBC Lab Results  Component Value Date   WBC 4.1 10/18/2015   HGB  13.0 03/09/2014   HCT 39.7 10/18/2015   MCV 93 10/18/2015   PLT 377 10/18/2015    BMET    Component Value Date/Time   NA 141 10/18/2015 1615   NA 138 05/09/2012 0830   K 4.3 10/18/2015 1615   K 3.7 05/09/2012 0830   CL 101 10/18/2015 1615   CL 108 (H) 05/09/2012 0830   CO2 23 10/18/2015 1615   CO2 23 05/09/2012 0830   GLUCOSE 102 (H) 10/18/2015 1615   GLUCOSE 84 05/09/2012 0830   BUN 13 10/18/2015 1615   BUN 16 05/09/2012 0830   CREATININE 0.62 10/18/2015 1615   CREATININE 0.95 05/09/2012 0830   CALCIUM 9.7 10/18/2015 1615   CALCIUM 8.6 05/09/2012 0830   GFRNONAA 116 10/18/2015 1615   GFRNONAA >60 05/09/2012 0830   GFRAA 134 10/18/2015 1615   GFRAA >60 05/09/2012 0830   CrCl cannot be calculated (Patient's most recent lab result is older  than the maximum 21 days allowed.).  COAG No results found for: INR, PROTIME  Radiology No results found.  Assessment/Plan 1. Varicose veins of both lower extremities with inflammation No surgery or intervention at this point in time.  I have reviewed my discussion with the patient regarding venous insufficiency and why it causes symptoms. I have discussed with the patient the chronic skin changes that accompany venous insufficiency and the long term sequela such as ulceration. Patient will contnue wearing graduated compression stockings on a daily basis, as this has provided excellent control of his edema. The patient will put the stockings on first thing in the morning and removing them in the evening. The patient is reminded not to sleep in the stockings.  In addition, behavioral modification including elevation during the day will be initiated. Exercise is strongly encouraged.  Duplex ultrasound of the bilateral lower extremity shows normal deep and superficial venous system with no reflux   Given the patient's good control and lack of any problems regarding the venous insufficiency and lymphedema a lymph pump in not need at this  time.  The patient will follow up with me PRN should anything change.  The patient voices agreement with this plan.  2. Chronic venous insufficiency See #1  3. Type 2 diabetes mellitus without complication, without long-term current use of insulin (HCC) Continue hypoglycemic medications as already ordered, these medications have been reviewed and there are no changes at this time.  Hgb A1C to be monitored as already arranged by primary service   4. Pain of left lower extremity See #1    Levora Dredge, MD  05/10/2016 9:50 PM

## 2016-05-31 ENCOUNTER — Encounter: Payer: Self-pay | Admitting: Obstetrics and Gynecology

## 2016-05-31 ENCOUNTER — Other Ambulatory Visit: Payer: Self-pay | Admitting: *Deleted

## 2016-05-31 MED ORDER — SYNTHROID 25 MCG PO TABS: 25.0000 ug | ORAL_TABLET | Freq: Every day | ORAL | 4 refills | Status: DC

## 2016-06-01 ENCOUNTER — Other Ambulatory Visit: Payer: Self-pay

## 2016-06-01 MED ORDER — LEVOTHYROXINE SODIUM 25 MCG PO TABS: 25.0000 ug | ORAL_TABLET | Freq: Every day | ORAL | 4 refills | Status: DC

## 2016-06-18 ENCOUNTER — Other Ambulatory Visit: Payer: Self-pay | Admitting: Obstetrics and Gynecology

## 2016-06-18 MED FILL — METFORMIN HCL ER 750 MG TAB: 750 | 30 days supply | Qty: 30 | Fill #2

## 2016-06-21 ENCOUNTER — Telehealth: Payer: Self-pay | Admitting: Neurology

## 2016-06-21 MED ORDER — METHYLPHENIDATE HCL ER (OSM) 36 MG PO TBCR: EXTENDED_RELEASE_TABLET | ORAL | 0 refills | Status: DC

## 2016-06-21 NOTE — Telephone Encounter (Signed)
Rx. awaiting YY sig, as RAS is ooo today/fim 

## 2016-06-21 NOTE — Telephone Encounter (Signed)
Pt request refill for methylphenidate 36 MG PO CR tablet 90 day supply. Pt advised RX will be ready within 24 hrs unless otherwise notified by RN. Pt is also aware clinic closes at noon tomorrow

## 2016-06-21 NOTE — Telephone Encounter (Signed)
Rx. up front [facility]/fim 

## 2016-07-16 ENCOUNTER — Encounter: Payer: Self-pay | Admitting: Neurology

## 2016-07-24 MED FILL — METFORMIN HCL ER 750 MG TAB: 750 | 30 days supply | Qty: 30 | Fill #3

## 2016-08-01 ENCOUNTER — Ambulatory Visit (INDEPENDENT_AMBULATORY_CARE_PROVIDER_SITE_OTHER): Payer: 59 | Admitting: Neurology

## 2016-08-01 ENCOUNTER — Encounter: Payer: Self-pay | Admitting: Neurology

## 2016-08-01 VITALS — BP 122/78 | HR 78 | Resp 16 | Ht 62.5 in | Wt 176.5 lb

## 2016-08-01 DIAGNOSIS — G35 Multiple sclerosis: Secondary | ICD-10-CM

## 2016-08-01 DIAGNOSIS — F988 Other specified behavioral and emotional disorders with onset usually occurring in childhood and adolescence: Secondary | ICD-10-CM | POA: Diagnosis not present

## 2016-08-01 DIAGNOSIS — R5383 Other fatigue: Secondary | ICD-10-CM | POA: Diagnosis not present

## 2016-08-01 DIAGNOSIS — Z79899 Other long term (current) drug therapy: Secondary | ICD-10-CM

## 2016-08-01 MED ORDER — METHYLPHENIDATE HCL ER (OSM) 36 MG PO TBCR: EXTENDED_RELEASE_TABLET | ORAL | 0 refills | Status: DC

## 2016-08-01 NOTE — Progress Notes (Signed)
[facility]  PATIENT: Carrie Nelson DOB: 05/02/1980  REFERRING CLINICIAN: Daniel Nones HISTORY FROM: Patient   REASON FOR VISIT: Multiple Sclerosis   HISTORICAL  CHIEF COMPLAINT:  Chief Complaint  Patient presents with  . Multiple Sclerosis    First yr. Lemtrada infusions were Nov. 13-17th of 2017.  Sts. she has done well, but is now experiencing increased fatigue./fim                                               f  HISTORY OF PRESENT ILLNESS:  Carrie Nelson is a 36 year old woman who was diagnosed with MS in 2007.    MS:   She had her first week of Lemtrada infusions November 13 through 01/13/2016. She will get 3 additional days in November 2018. She tolerated the infusions fairly well though she did have some chills and headaches. She also felt weaker the next week.   Fatigue continues to be her major problem.  Gait/strength/sensation:    Gait strength and sensation are generally doing well. She sometimes drags the right leg when she is more tired notices that the right leg is weak when she climbs stairs sometimes there will be mild dysesthesias in that leg but they are never troubling.   Bladder: She denies urinary frequency, urgency or hesitancy..              Vision:   She denies  any MS related vision problem. e.     Fatigue/sleep/cognition:     She has more fatigue than last year.   Concerta 36 mg once or twice a day helps.  She usually takes the first dose in the morning and will take a second dose around noon some days. Besides MS fatigue, she notes less sleepiness in the afternoons.   She sleeps well at night.     She notes that her decreased focus is also better.    She denies memory problems.     Mood:   She denies any depression.   She notes mild anxiety helped by Prozac 10 mg   MS History:   She was diagnosed in 2007 but,  in retrospect, had right foot dragging that started around 2005. Then shortly after that she had an episode of left facial numbness. In 2007 she had an episode of slurred speech and worsening clumsiness.  MRI of the brain that was consistent with multiple sclerosis. She also had a lumbar puncture and her CSF compatible with the diagnosis of MS. Most of her symptoms improved over the next few months but she did not get 100% back to baseline and she continues to have minimal residual weakness, clumsiness and numbness. She was initially placed on Betaseron but switched to Tysabri because of some generalized side effects of Betaseron. She was seeing Dr. Tinnie Gens 2007 until 2014.   She was on Tysabri between 2008 and late 2014 when she stopped because of a desire to get pregnant. She became pregnant in early 2015 and delivered in November 2015. She had not been on any disease modifying therapy since late 2014 and then started Tecfidera 02/2014.Marland Kitchen   She did not tolerate it well and started [facility] 12/2015.     REVIEW OF SYSTEMS:  Constitutional: No fevers, chills, sweats, or change in appetite Eyes: No visual changes, double vision, eye pain Ear, nose and throat: No hearing loss, ear pain, nasal congestion, sore throat Cardiovascular: No chest pain, palpitations Respiratory:  No shortness of breath at rest or with exertion.   No wheezes GastrointestinaI: No  nausea, vomiting, diarrhea, abdominal pain, fecal incontinence Genitourinary:  No dysuria, urinary retention or frequency.  No nocturia. Musculoskeletal:  No neck pain, back pain Integumentary: No rash, pruritus, skin lesions Neurological: as above Psychiatric: No depression at this time.  No anxiety.   Occasionally she has irritability. Endocrine: She she is borderline diabetic. No palpitations, diaphoresis, change in appetite, change in weigh or increased thirst Hematologic/Lymphatic:  No anemia, purpura, petechiae. Allergic/Immunologic: She has seasonal allergies and is treated with several  medications.  ALLERGIES: No Known Allergies  HOME MEDICATIONS: Outpatient Medications Prior to Visit  Medication Sig Dispense Refill  . Alemtuzumab (LEMTRADA) 12 MG/1.2ML SOLN Inject 12 mg into the vein.    Marland Kitchen azelastine (ASTELIN) 0.1 % nasal spray   7  . Azelastine-Fluticasone (DYMISTA) 137-50 MCG/ACT SUSP 1 spray by Nasal route 2 (two) times daily.    . Cetirizine HCl (ZYRTEC ALLERGY) 10 MG CAPS Take 10 mg by mouth daily.    . cholecalciferol (VITAMIN D) 1000 UNITS tablet Take 2,000 Units by mouth daily.     Marland Kitchen FLUoxetine (PROZAC) 10 MG capsule TAKE 1 CAPSULE BY MOUTH ONCE DAILY 30 capsule 4  . LARIN 1.5/30 1.5-30 MG-MCG tablet TAKE 1 TABLET BY MOUTH DAILY 63 tablet 5  . levothyroxine (SYNTHROID) 25 MCG tablet Take 1 tablet (25 mcg total) by mouth daily. 90 tablet 4  . metFORMIN (GLUCOPHAGE-XR) 750 MG 24 hr tablet TAKE 1 TABLET BY MOUTH DAILY WITH BREAKFAST. 30 tablet 5  . Multiple Vitamins-Minerals (MULTIVITAMIN PO) Take by mouth.    . methylphenidate 36 MG PO CR tablet Take one to two tablets daily as needed. 90 tablet 0  . fluticasone (FLONASE) 50 MCG/ACT nasal spray Place into the nose.    . valACYclovir (VALTREX) 500 MG tablet Take 1 tablet (500 mg total) by mouth 2 (two) times daily. (Patient not taking: Reported on 08/01/2016) 60 tablet 3  . zolpidem (AMBIEN) 5 MG tablet Take 1 tablet (5 mg  total) by mouth at bedtime as needed for sleep. (Patient not taking: Reported on 08/01/2016) 10 tablet 0  . azithromycin (ZITHROMAX) 500 MG tablet Take 1 tablet (500 mg total) by mouth daily. Take 1 tablet daily for 3 days. (Patient not taking: Reported on 08/01/2016) 3 tablet 0  . brompheniramine-pseudoephedrine-DM 30-2-10 MG/5ML syrup Take 5 mLs by mouth 4 (four) times daily as needed. 118 mL 0  . fexofenadine (ALLEGRA) 180 MG tablet Take by mouth.    . linaclotide (LINZESS) 72 MCG capsule Take 1 capsule (72 mcg total) by mouth daily before breakfast. (Patient not taking: Reported on 08/01/2016) 30 capsule 6  . montelukast (SINGULAIR) 10 MG tablet Take 10 mg by mouth at bedtime.    . NORETHINDRONE PO Take by mouth daily.     Facility-Administered Medications Prior to Visit  Medication Dose Route Frequency Provider Last Rate Last Dose  . gadopentetate dimeglumine (MAGNEVIST) injection 16 mL  16 mL Intravenous Once PRN Sameena Artus, Pearletha Furl, MD        PAST MEDICAL HISTORY: Past Medical History:  Diagnosis Date  . Multiple sclerosis (HCC)   . Syncope   . Thyroid disease   . Tremor     PAST SURGICAL HISTORY: Past Surgical History:  Procedure Laterality Date  . BUNIONECTOMY    . FOOT SURGERY    . KNEE ARTHROSCOPY W/ ACL RECONSTRUCTION Left 2010    FAMILY HISTORY: Family History  Problem Relation Age of Onset  . Prostate cancer Father   . Diabetes Father   . Arthritis Unknown   . Dementia Unknown   . Hypertension Unknown   . Heart disease Unknown     SOCIAL HISTORY:  Social History   Social History  . Marital status: Married    Spouse name: N/A  . Number of children: o  . Years of education: N/A   Occupational History  . Not on file.   Social History Main Topics  . Smoking status: Never Smoker  . Smokeless tobacco: Never Used  .  Alcohol use Yes     Comment: minimal weekly   . Drug use: No  . Sexual activity: Yes    Birth control/ protection: Pill   Other Topics Concern   . Not on file   Social History Narrative   Patient lives with parents.   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    Patient has a Probation officer.      PHYSICAL EXAM  Vitals:   08/01/16 1308 08/01/16 1315  BP: 122/78   Pulse: 78   Resp: (!) 6 16  Weight: 176 lb 8 oz (80.1 kg)   Height: 5' 2.5" (1.588 m)     Body mass index is 31.77 kg/m.   General: The patient is well-developed and well-nourished and in no acute distress   Neurologic Exam  Mental status: The patient is alert and oriented x 3 at the time of the examination. The patient has apparent normal recent and remote memory, with an apparently normal attention span and concentration ability.   Speech is normal.  Cranial nerves: Extraocular movements are full.   Facial strength and sensation is normal. Trapezius and sternocleidomastoid strength is normal. No dysarthria is noted.   No obvious hearing deficits are noted.  Motor:  Muscle bulk and tone are normal. Strength is  5 / 5 in all 4 extremities.   Sensory: Sensory testing is intact to touch and vibration in the arms and legs.  Coordination: Cerebellar testing reveals good finger-nose-finger and heel-to-shin bilaterally.  Gait and station: Station and gait are normal. Tandem gait is minimally wide. Romberg is negative.   Reflexes: Deep tendon reflexes are symmetric and normal in arms. The right knee reflex is slightly more than the left but still normal.     DIAGNOSTIC DATA (LABS, IMAGING, TESTING) - I reviewed patient records, labs, notes, testing and imaging myself where available.  Lab Results  Component Value Date   WBC 4.1 10/18/2015   HGB 13.1 10/18/2015   HCT 39.7 10/18/2015   MCV 93 10/18/2015   PLT 377 10/18/2015      Component Value Date/Time   NA 141 10/18/2015 1615   NA 138 05/09/2012 0830   K 4.3 10/18/2015 1615   K 3.7 05/09/2012 0830   CL 101 10/18/2015 1615   CL 108  (H) 05/09/2012 0830   CO2 23 10/18/2015 1615   CO2 23 05/09/2012 0830   GLUCOSE 102 (H) 10/18/2015 1615   GLUCOSE 84 05/09/2012 0830   BUN 13 10/18/2015 1615   BUN 16 05/09/2012 0830   CREATININE 0.62 10/18/2015 1615   CREATININE 0.95 05/09/2012 0830   CALCIUM 9.7 10/18/2015 1615   CALCIUM 8.6 05/09/2012 0830   PROT 6.7 10/18/2015 1615   PROT 6.7 05/09/2012 0830   ALBUMIN 4.2 10/18/2015 1615   ALBUMIN 3.4 05/09/2012 0830   AST 12 10/18/2015 1615   AST 17 05/09/2012 0830   ALT 9 10/18/2015 1615   ALT 16 05/09/2012 0830   ALKPHOS 43 10/18/2015 1615   ALKPHOS 55 05/09/2012 0830   BILITOT 0.3 10/18/2015 1615   BILITOT 0.7 05/09/2012 0830   GFRNONAA 116 10/18/2015 1615   GFRNONAA >60 05/09/2012 0830   GFRAA 134 10/18/2015 1615   GFRAA >60 05/09/2012 0830        ASSESSMENT AND PLAN  Multiple sclerosis (HCC)  High risk medication use  Attention deficit disorder, unspecified hyperactivity presence  Other fatigue   1.   She will  continue her monthly monitoring make sure that she does not have a low platelet count, elevated creatinine, blood or protein in the urine or thyroid abnormalities.  Her next infusions will be in November.   We will need to check an MRI a few weeks before her next infusion to make sure that she is not experiencing any subclinical progression. If present, we will need to consider a different medication such as ocrelizumab  2.  Remain active and exercises as tolerated. 3.  Continue Concerta 36 mg one or two daily .   New script 4.   Return in 6 months or call sooner if new or worsening neurologic symptoms.    Teneil Shiller A. Epimenio Foot, MD, PhD 08/01/2016, 4:37 PM Certified in Neurology, Clinical Neurophysiology, Sleep Medicine, Pain Medicine and Neuroimaging  [facility] [address] [place], [place] [address] [phone]-

## 2016-08-23 ENCOUNTER — Encounter: Payer: 59 | Admitting: Obstetrics and Gynecology

## 2016-09-03 MED FILL — METFORMIN HCL ER 750 MG TAB: 750 | 30 days supply | Qty: 30 | Fill #4

## 2016-09-06 ENCOUNTER — Ambulatory Visit: Payer: Self-pay | Admitting: Physician Assistant

## 2016-09-06 ENCOUNTER — Encounter: Payer: Self-pay | Admitting: Physician Assistant

## 2016-09-06 VITALS — BP 110/68 | HR 83 | Temp 98.3°F

## 2016-09-06 DIAGNOSIS — R300 Dysuria: Secondary | ICD-10-CM

## 2016-09-06 DIAGNOSIS — N390 Urinary tract infection, site not specified: Secondary | ICD-10-CM

## 2016-09-06 LAB — POCT URINALYSIS DIPSTICK
BILIRUBIN UA: NEGATIVE
Blood, UA: NEGATIVE
GLUCOSE UA: NEGATIVE
Ketones, UA: NEGATIVE
NITRITE UA: NEGATIVE
Protein, UA: NEGATIVE
Spec Grav, UA: 1.02 (ref 1.010–1.025)
UROBILINOGEN UA: 0.2 U/dL
pH, UA: 6.5 (ref 5.0–8.0)

## 2016-09-06 MED ORDER — NITROFURANTOIN MONOHYD MACRO 100 MG PO CAPS: 100.0000 mg | ORAL_CAPSULE | Freq: Two times a day (BID) | ORAL | 0 refills | Status: DC

## 2016-09-06 NOTE — Progress Notes (Signed)
S: states she felt like she was getting a uti, has macrobid to take after intercourse to prevent utis, took one on tues and one on wed, still having some burning with urination and pelvic pain, some chills last night but no known fever, no n/v/d  O: vitals wnl, nad, neuro intact, ua trace leuks  A: uti  P: macrobid 100mg  bid x 7d

## 2016-09-11 ENCOUNTER — Encounter: Payer: Self-pay | Admitting: Neurology

## 2016-10-04 DIAGNOSIS — Z8639 Personal history of other endocrine, nutritional and metabolic disease: Secondary | ICD-10-CM | POA: Diagnosis not present

## 2016-10-04 DIAGNOSIS — G35 Multiple sclerosis: Secondary | ICD-10-CM | POA: Diagnosis not present

## 2016-10-04 DIAGNOSIS — F419 Anxiety disorder, unspecified: Secondary | ICD-10-CM | POA: Diagnosis not present

## 2016-10-04 DIAGNOSIS — Z0000 Encounter for general adult medical examination without abnormal findings: Secondary | ICD-10-CM | POA: Diagnosis not present

## 2016-10-16 MED FILL — METFORMIN HCL ER 750 MG TAB: 750 | 30 days supply | Qty: 30 | Fill #5

## 2016-10-17 ENCOUNTER — Telehealth: Payer: Self-pay | Admitting: Neurology

## 2016-10-17 NOTE — Telephone Encounter (Signed)
I have spoken with Carrie Nelson this afternoon.  Her FMLA paperwork needs to be renewed.  I amended it and faxed it to [employer] per her request.  Will complete a new set of FMLA paperwork if [employer] requires it/fim

## 2016-10-17 NOTE — Telephone Encounter (Signed)
Pt called the clinic FMLA is ending 8/31. Employer needs and updated certificate/documentation. Pt is requesting to extend FMLA due to lemtrada treatments. Please call her.

## 2016-11-02 ENCOUNTER — Encounter: Payer: Self-pay | Admitting: Obstetrics and Gynecology

## 2016-11-02 ENCOUNTER — Other Ambulatory Visit: Payer: Self-pay | Admitting: Obstetrics and Gynecology

## 2016-11-02 ENCOUNTER — Ambulatory Visit (INDEPENDENT_AMBULATORY_CARE_PROVIDER_SITE_OTHER): Payer: 59 | Admitting: Obstetrics and Gynecology

## 2016-11-02 VITALS — BP 124/86 | HR 86 | Ht 62.0 in | Wt 179.3 lb

## 2016-11-02 DIAGNOSIS — Z01419 Encounter for gynecological examination (general) (routine) without abnormal findings: Secondary | ICD-10-CM

## 2016-11-02 NOTE — Progress Notes (Signed)
Subjective:   Carrie Nelson is a 36 y.o. G54P1001 Caucasian female here for a routine well-woman exam.  No LMP recorded. Patient is not currently having periods (Reason: Oral contraceptives).    Current complaints: weight gain, joined weight watchers, PCP: Marikay Alar       does desire labs  Social History: Sexual: heterosexual Marital Status: married Living situation: with family Occupation: Patent attorney at [employer] Tobacco/alcohol: no tobacco use Illicit drugs: no history of illicit drug use  The following portions of the patient's history were reviewed and updated as appropriate: allergies, current medications, past family history, past medical history, past social history, past surgical history and problem list.  Past Medical History Past Medical History:  Diagnosis Date  . Multiple sclerosis (HCC)   . Syncope   . Thyroid disease   . Tremor     Past Surgical History Past Surgical History:  Procedure Laterality Date  . BUNIONECTOMY    . FOOT SURGERY    . KNEE ARTHROSCOPY W/ ACL RECONSTRUCTION Left 2010    Gynecologic History G1P1001  No LMP recorded. Patient is not currently having periods (Reason: Oral contraceptives). Contraception: OCP (estrogen/progesterone) Last Pap: 2012. Results were: normal   Obstetric History OB History  Gravida Para Term Preterm AB Living  1 1 1     1   SAB TAB Ectopic Multiple Live Births          1    # Outcome Date GA Lbr Len/2nd Weight Sex Delivery Anes PTL Lv  1 Term 01/22/14    M CS-LTranv   LIV      Current Medications Current Outpatient Prescriptions on File Prior to Visit  Medication Sig Dispense Refill  . Alemtuzumab (LEMTRADA) 12 MG/1.2ML SOLN Inject 12 mg into the vein.    Marland Kitchen azelastine (ASTELIN) 0.1 % nasal spray   7  . Azelastine-Fluticasone (DYMISTA) 137-50 MCG/ACT SUSP 1 spray by Nasal route 2 (two) times daily.    . Cetirizine HCl (ZYRTEC ALLERGY) 10 MG CAPS Take 10 mg by mouth daily.    . cholecalciferol  (VITAMIN D) 1000 UNITS tablet Take 2,000 Units by mouth daily.     Marland Kitchen FLUoxetine (PROZAC) 10 MG capsule TAKE 1 CAPSULE BY MOUTH ONCE DAILY 30 capsule 4  . LARIN 1.5/30 1.5-30 MG-MCG tablet TAKE 1 TABLET BY MOUTH DAILY 63 tablet 5  . levothyroxine (SYNTHROID) 25 MCG tablet Take 1 tablet (25 mcg total) by mouth daily. 90 tablet 4  . metFORMIN (GLUCOPHAGE-XR) 750 MG 24 hr tablet TAKE 1 TABLET BY MOUTH DAILY WITH BREAKFAST. 30 tablet 5  . methylphenidate 36 MG PO CR tablet Take one to two tablets daily as needed. 90 tablet 0  . Multiple Vitamins-Minerals (MULTIVITAMIN PO) Take by mouth.    . fluticasone (FLONASE) 50 MCG/ACT nasal spray Place into the nose.    . nitrofurantoin, macrocrystal-monohydrate, (MACROBID) 100 MG capsule Take 1 capsule (100 mg total) by mouth 2 (two) times daily. (Patient not taking: Reported on 11/02/2016) 14 capsule 0  . valACYclovir (VALTREX) 500 MG tablet Take 1 tablet (500 mg total) by mouth 2 (two) times daily. (Patient not taking: Reported on 08/01/2016) 60 tablet 3  . zolpidem (AMBIEN) 5 MG tablet Take 1 tablet (5 mg total) by mouth at bedtime as needed for sleep. (Patient not taking: Reported on 08/01/2016) 10 tablet 0   Current Facility-Administered Medications on File Prior to Visit  Medication Dose Route Frequency Provider Last Rate Last Dose  . gadopentetate dimeglumine (MAGNEVIST) injection 16  mL  16 mL Intravenous Once PRN Sater, Pearletha Furl, MD        Review of Systems Patient denies any headaches, blurred vision, shortness of breath, chest pain, abdominal pain, problems with bowel movements, urination, or intercourse.  Objective:  BP 124/86   Pulse 86   Ht  (1.575 m)   Wt 179 lb 4.8 oz (81.3 kg)   BMI 32.79 kg/m  Physical Exam  General:  Well developed, well nourished, no acute distress. She is alert and oriented x3. Skin:  Warm and dry Neck:  Midline trachea, no thyromegaly or nodules Cardiovascular: Regular rate and rhythm, no murmur heard Lungs:   Effort normal, all lung fields clear to auscultation bilaterally Breasts:  No dominant palpable mass, retraction, or nipple discharge Abdomen:  Soft, non tender, no hepatosplenomegaly or masses Pelvic:  External genitalia is normal in appearance.  The vagina is normal in appearance. The cervix is bulbous, no CMT.  Thin prep pap is done with HR HPV cotesting. Uterus is felt to be normal size, shape, and contour.  No adnexal masses or tenderness noted. Extremities:  No swelling or varicosities noted Psych:  She has a normal mood and affect  Assessment:   Healthy well-woman exam Obesity MS   Plan:  Labs obtained and will follow up accordingly.  Encouraged weight loss F/U 1 year for AE, or sooner if needed   Morgan Rennert Suzan Nailer, CNM

## 2016-11-03 LAB — LIPID PANEL
CHOL/HDL RATIO: 3.2 ratio (ref 0.0–4.4)
CHOLESTEROL TOTAL: 194 mg/dL (ref 100–199)
HDL: 61 mg/dL (ref >39)
LDL Calculated: 106 mg/dL — ABNORMAL HIGH (ref 0–99)
TRIGLYCERIDES: 137 mg/dL (ref 0–149)
VLDL Cholesterol Cal: 27 mg/dL (ref 5–40)

## 2016-11-03 LAB — HEMOGLOBIN A1C
ESTIMATED AVERAGE GLUCOSE: 100 mg/dL
HEMOGLOBIN A1C: 5.1 % (ref 4.8–5.6)

## 2016-11-03 LAB — VITAMIN D 25 HYDROXY (VIT D DEFICIENCY, FRACTURES): Vit D, 25-Hydroxy: 99.3 ng/mL (ref 30.0–100.0)

## 2016-11-08 LAB — CYTOLOGY - PAP

## 2016-11-13 ENCOUNTER — Encounter: Payer: Self-pay | Admitting: Neurology

## 2016-11-15 ENCOUNTER — Telehealth: Payer: Self-pay | Admitting: Neurology

## 2016-11-15 NOTE — Telephone Encounter (Signed)
Message printed and given to Tina in the infusion suite/fim 

## 2016-11-15 NOTE — Telephone Encounter (Signed)
Deborah with MS 1 to 1 called in reference to needing patients status form for Lemtrada due 07/08/16.  Please fax form to [phone].

## 2016-11-19 ENCOUNTER — Other Ambulatory Visit: Payer: Self-pay | Admitting: Obstetrics and Gynecology

## 2016-11-19 MED FILL — METFORMIN HCL ER 750 MG TAB: 750 | 30 days supply | Qty: 30 | Fill #0

## 2016-12-10 ENCOUNTER — Encounter: Payer: Self-pay | Admitting: Neurology

## 2016-12-24 MED FILL — METFORMIN HCL ER 750 MG TAB: 750 | 30 days supply | Qty: 30 | Fill #1

## 2016-12-25 ENCOUNTER — Ambulatory Visit (INDEPENDENT_AMBULATORY_CARE_PROVIDER_SITE_OTHER): Payer: 59 | Admitting: Neurology

## 2016-12-25 ENCOUNTER — Encounter: Payer: Self-pay | Admitting: Neurology

## 2016-12-25 VITALS — BP 118/66 | HR 80 | Resp 16 | Ht 62.0 in | Wt 178.0 lb

## 2016-12-25 DIAGNOSIS — R269 Unspecified abnormalities of gait and mobility: Secondary | ICD-10-CM

## 2016-12-25 DIAGNOSIS — G35 Multiple sclerosis: Secondary | ICD-10-CM

## 2016-12-25 DIAGNOSIS — R5383 Other fatigue: Secondary | ICD-10-CM | POA: Diagnosis not present

## 2016-12-25 DIAGNOSIS — F988 Other specified behavioral and emotional disorders with onset usually occurring in childhood and adolescence: Secondary | ICD-10-CM

## 2016-12-25 DIAGNOSIS — Z79899 Other long term (current) drug therapy: Secondary | ICD-10-CM | POA: Diagnosis not present

## 2016-12-25 MED ORDER — METHYLPHENIDATE HCL ER (OSM) 36 MG PO TBCR: EXTENDED_RELEASE_TABLET | ORAL | 0 refills | Status: DC

## 2016-12-25 MED FILL — CONCERTA 36 MG TABLET ER: 36 | 45 days supply | Qty: 90 | Fill #0

## 2016-12-25 NOTE — Progress Notes (Signed)
[facility]  PATIENT: Carrie Nelson DOB: [birth year]  REFERRING CLINICIAN: Daniel Nones HISTORY FROM: Patient   REASON FOR VISIT: Multiple Sclerosis   HISTORICAL  CHIEF COMPLAINT:  Chief Complaint  Patient presents with  . Multiple Sclerosis    1st yr. Lemtrada infusions were November 13-17, 2017.  Denies new or worsening sx.  Would like to discuss CD 4 ct./fim                                               f  HISTORY OF PRESENT ILLNESS:  Carrie Nelson is a 36 y.o. woman who was diagnosed with MS in 2007.    Update 12/25/2016:   She had her first treatment with [facility] November 2017 and will have her second 3 day course next month. She feels her MS has been very stable since starting [place]. Specifically there has not been any exacerbations. She had a few mild infusion reactions the week of the infusions but was fine by the next Monday. She feels she is stable neurologically with no new numbness or weakness. The right leg is mildly clumsier and slightly weaker than the left leg. She notes this mostly when she tries to move fast. She denies any difficulties with her bladder. There is no problems with vision.  She has fatigue, physical and mental.   Concerta helps best when she takes mid  morning.    Most days, she just takes one dose in the mid morning.  By taking it later, she has not needed the second dose.  She sleeps well at night.    She denies cognitive issues.     She denies any significant depression or anxiety.   She is on low dose Prozac with benefit   From 08/02/2015  MS:   She had her first week of Lemtrada infusions November 13 through 01/13/2016. She will get 3 additional days in November 2018. She tolerated the infusions fairly well though she did have some chills and headaches. She also felt weaker the next week.   Fatigue continues to be her major problem.  Gait/strength/sensation:    Gait strength and sensation are generally doing well. She sometimes drags the right leg when she is more tired notices that the right leg is weak when she climbs stairs sometimes there will be mild dysesthesias in that leg but they are never troubling.   Bladder: She denies urinary frequency, urgency or hesitancy..              Vision:   She denies any MS related vision problem. e.     Fatigue/sleep/cognition:     She has more fatigue than last year.   Concerta 36 mg once or twice a day helps.  She usually takes the first dose in the morning and will take a second dose around noon some days. Besides MS fatigue, she notes less sleepiness in the afternoons.   She sleeps well at night.     She notes that her decreased focus is also better.    She denies memory problems.     Mood:   She denies any depression.   She notes mild anxiety helped by Prozac 10 mg   MS History:   She was diagnosed in 2007 but,  in retrospect, had right foot dragging that started around 2005. Then shortly after that she had an episode of left facial numbness. In 2007 she had an episode of slurred speech and worsening clumsiness.  MRI of the brain that was consistent with multiple sclerosis. She also had a lumbar puncture and her CSF compatible with the diagnosis of MS. Most of her symptoms improved over the next few  months but she did not get 100% back to baseline and she continues to have minimal residual weakness, clumsiness and numbness. She was initially placed on Betaseron but switched to Tysabri because of some generalized side effects of Betaseron. She was seeing Dr. Tinnie Gens 2007 until 2014.   She was on Tysabri between 2008 and late 2014 when she  stopped because of a desire to get pregnant. She became pregnant in early 2015 and delivered in November 2015. She had not been on any disease modifying therapy since late 2014 and then started Tecfidera 02/2014.Marland Kitchen.   She did not tolerate it well and [facility].     REVIEW OF SYSTEMS:  Constitutional: No fevers, chills, sweats, or change in appetite Eyes: No visual changes, double vision, eye pain Ear, nose and throat: No hearing loss, ear pain, nasal congestion, sore throat Cardiovascular: No chest pain, palpitations Respiratory:  No shortness of breath at rest or with exertion.   No wheezes GastrointestinaI: No nausea, vomiting, diarrhea, abdominal pain, fecal incontinence Genitourinary:  No dysuria, urinary retention or frequency.  No nocturia. Musculoskeletal:  No neck pain, back pain Integumentary: No rash, pruritus, skin lesions Neurological: as above Psychiatric: No depression at this time.  No anxiety.   Occasionally she has irritability. Endocrine: She she is borderline diabetic. No palpitations, diaphoresis, change in appetite, change in weigh or increased thirst Hematologic/Lymphatic:  No anemia, purpura, petechiae. Allergic/Immunologic: She has seasonal allergies and is treated with several medications.  ALLERGIES: No Known Allergies  HOME MEDICATIONS: Outpatient Medications Prior to Visit  Medication Sig Dispense Refill  . Alemtuzumab (LEMTRADA) 12 MG/1.2ML SOLN Inject 12 mg into the vein.    Marland Kitchen. azelastine (ASTELIN) 0.1 % nasal spray   7  . Azelastine-Fluticasone (DYMISTA) 137-50 MCG/ACT SUSP 1 spray by Nasal route 2 (two) times  daily.    . Cetirizine HCl (ZYRTEC ALLERGY) 10 MG CAPS Take 10 mg by mouth daily.    . cholecalciferol (VITAMIN D) 1000 UNITS tablet Take 2,000 Units by mouth daily.     Marland Kitchen. FLUoxetine (PROZAC) 10 MG capsule TAKE 1 CAPSULE BY MOUTH ONCE DAILY 30 capsule 4  . LARIN 1.5/30 1.5-30 MG-MCG tablet TAKE 1 TABLET BY MOUTH DAILY 63 tablet 5  . levothyroxine (SYNTHROID) 25 MCG tablet Take 1 tablet (25 mcg total) by mouth daily. 90 tablet 4  . metFORMIN (GLUCOPHAGE-XR) 750 MG 24 hr tablet TAKE 1 TABLET BY MOUTH DAILY WITH BREAKFAST. 30 tablet 5  . methylphenidate 36 MG PO CR tablet Take one to two tablets daily as needed. 90 tablet 0  . Multiple Vitamins-Minerals (MULTIVITAMIN PO) Take by mouth.    . fluticasone (FLONASE) 50 MCG/ACT nasal spray Place into the nose.    . nitrofurantoin, macrocrystal-monohydrate, (MACROBID) 100 MG capsule Take 1 capsule (100 mg total) by mouth 2 (two) times daily. (Patient not taking: Reported on 11/02/2016) 14 capsule 0  . valACYclovir (VALTREX) 500 MG tablet Take 1 tablet (500 mg total) by mouth 2 (two) times daily. (Patient not taking: Reported on 08/01/2016) 60 tablet 3  . zolpidem (AMBIEN) 5 MG tablet Take 1 tablet (5 mg total) by mouth at bedtime as needed for sleep. (Patient not taking: Reported on 08/01/2016) 10 tablet 0   Facility-Administered Medications Prior to Visit  Medication Dose Route Frequency Provider Last Rate Last Dose  . gadopentetate dimeglumine (MAGNEVIST) injection 16 mL  16 mL Intravenous Once PRN Sater, Pearletha Furlichard A, MD        PAST MEDICAL HISTORY: Past Medical History:  Diagnosis Date  . Multiple sclerosis (HCC)   . Syncope   . Thyroid disease   . Tremor     PAST SURGICAL HISTORY: Past Surgical History:  Procedure Laterality Date  . BUNIONECTOMY    . FOOT SURGERY    . KNEE ARTHROSCOPY W/ ACL RECONSTRUCTION Left 2010    FAMILY HISTORY:  Family History  Problem Relation Age of Onset  . Prostate cancer Father   . Diabetes Father   . Arthritis  Unknown   . Dementia Unknown   . Hypertension Unknown   . Heart disease Unknown     SOCIAL HISTORY:  Social History   Social History  . Marital status: Married    Spouse name: N/A  . Number of children: o  . Years of education: N/A   Occupational History  . Not on file.   Social History Main Topics  . Smoking status: Never Smoker  . Smokeless tobacco: Never Used  . Alcohol use Yes     Comment: minimal weekly   . Drug use: No  . Sexual activity: Yes    Birth control/ protection: Pill   Other Topics Concern  . Not on file   Social History Narrative   Patient lives with parents.   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    Patient has a Probation officer.      PHYSICAL EXAM  Vitals:   12/25/16 1305  BP: 118/66  Pulse: 80  Resp: 16  Weight: 178 lb (80.7 kg)  Height: 5\' 2"  (1.575 m)    Body mass index is 32.56 kg/m.   General: The patient is well-developed and well-nourished and in no acute distress   Neurologic Exam  Mental status: The patient is alert and oriented x 3 at the time of the examination. The patient has apparent normal recent and remote memory, with an apparently normal attention span and concentration ability.   Speech is normal.  Cranial nerves:   Extraocular muscles are intact. Facial strength and sensation is normal. Trapezius strength is normal..   No obvious hearing deficits are noted.  Motor:  Muscle bulk and tone are normal. Strength is  5 / 5 in all 4 extremities.   Sensory: Sensory testing is intact to touch and vibration in the arms and legs.  Coordination: Cerebellar testing reveals good finger-nose-finger and heel-to-shin bilaterally.  Gait and station: Station and gait are normal. The tandem gait is mildly wide.. Romberg is negative.   Reflexes: Deep tendon reflexes are symmetric and normal in arms. The right knee reflex is mildly increased compared to the left.  Ankle reflexes are symmetric.    DIAGNOSTIC DATA (LABS, IMAGING, TESTING) - I reviewed patient records, labs, notes, testing and imaging myself where available.  Lab Results  Component Value Date   WBC 4.1 10/18/2015   HGB 13.1 10/18/2015   HCT 39.7 10/18/2015   MCV 93 10/18/2015   PLT 377 10/18/2015      Component Value Date/Time   NA 141 10/18/2015 1615   NA 138 05/09/2012 0830   K 4.3 10/18/2015 1615   K 3.7 05/09/2012 0830   CL 101 10/18/2015 1615   CL 108 (H) 05/09/2012 0830   CO2 23 10/18/2015 1615   CO2 23 05/09/2012 0830   GLUCOSE 102 (H) 10/18/2015 1615   GLUCOSE 84 05/09/2012 0830   BUN 13 10/18/2015 1615   BUN 16 05/09/2012 0830   CREATININE 0.62 10/18/2015 1615   CREATININE 0.95 05/09/2012 0830   CALCIUM 9.7 10/18/2015 1615   CALCIUM 8.6 05/09/2012 0830   PROT 6.7 10/18/2015 1615   PROT 6.7 05/09/2012 0830   ALBUMIN 4.2 10/18/2015 1615   ALBUMIN 3.4 05/09/2012 0830   AST 12 10/18/2015 1615   AST 17 05/09/2012 0830   ALT 9 10/18/2015 1615  ALT 16 05/09/2012 0830   ALKPHOS 43 10/18/2015 1615   ALKPHOS 55 05/09/2012 0830   BILITOT 0.3 10/18/2015 1615   BILITOT 0.7 05/09/2012 0830   GFRNONAA 116 10/18/2015 1615   GFRNONAA >60 05/09/2012 0830   GFRAA 134 10/18/2015 1615   GFRAA >60 05/09/2012 0830        ASSESSMENT AND PLAN  Multiple sclerosis (HCC)  Other fatigue  Attention deficit disorder, unspecified hyperactivity presence  High risk medication use  Gait disturbance   1.   She will have the second course of Lemtrada around Thanksgiving next month. She will continue with the REMS monitoring. She has been very compliant. CD4 counts are closer to  normal.  2. She is advised to remain active and exercises as tolerated. 3.  She will continue Concerta 36 mg daily.  4.   Return in 6 months or call sooner if new or worsening neurologic symptoms.    Richard A. Epimenio Foot, MD, PhD 12/25/2016, 1:29 PM Certified in Neurology, Clinical  Neurophysiology, Sleep Medicine, Pain Medicine and Neuroimaging  [facility] [address] [place], [place] [address] [phone]-

## 2016-12-28 ENCOUNTER — Telehealth: Payer: 59 | Admitting: Family

## 2016-12-28 DIAGNOSIS — B9689 Other specified bacterial agents as the cause of diseases classified elsewhere: Secondary | ICD-10-CM | POA: Diagnosis not present

## 2016-12-28 DIAGNOSIS — J028 Acute pharyngitis due to other specified organisms: Secondary | ICD-10-CM

## 2016-12-28 MED ORDER — AZITHROMYCIN 250 MG PO TABS: ORAL_TABLET | ORAL | 0 refills | Status: DC

## 2016-12-28 MED ORDER — PREDNISONE 5 MG PO TABS: 5.0000 mg | ORAL_TABLET | ORAL | 0 refills | Status: DC

## 2016-12-28 MED ORDER — BENZONATATE 100 MG PO CAPS: 100.0000 mg | ORAL_CAPSULE | Freq: Three times a day (TID) | ORAL | 0 refills | Status: DC | PRN

## 2016-12-28 NOTE — Progress Notes (Signed)

## 2017-01-04 ENCOUNTER — Encounter: Payer: Self-pay | Admitting: Physician Assistant

## 2017-01-04 ENCOUNTER — Ambulatory Visit: Payer: Self-pay | Admitting: Physician Assistant

## 2017-01-04 VITALS — BP 124/72 | HR 98 | Temp 98.7°F

## 2017-01-04 DIAGNOSIS — J0100 Acute maxillary sinusitis, unspecified: Secondary | ICD-10-CM

## 2017-01-04 MED ORDER — FLUCONAZOLE 150 MG PO TABS: ORAL_TABLET | ORAL | 0 refills | Status: DC

## 2017-01-04 MED ORDER — AMOXICILLIN-POT CLAVULANATE 875-125 MG PO TABS: 1.0000 | ORAL_TABLET | Freq: Two times a day (BID) | ORAL | 0 refills | Status: DC

## 2017-01-04 MED ORDER — PREDNISONE 10 MG PO TABS: 30.0000 mg | ORAL_TABLET | Freq: Every day | ORAL | 0 refills | Status: DC

## 2017-01-04 NOTE — Progress Notes (Signed)
S: A she complains of continued sinus pain and congestion. She had a visit and was given a Z-Pak at that time. She felt better for about 3 days after finishing the Z-Pak and then the symptoms returned. No fever or chills but she states she does feel hot. Has a sinus headache in the frontal area racing up to the scalp. Is supposed to have an infusion for her MS in 2 weeks and is concerned she will be well enough for  O: Vitals are normal, TMs are dull, nasal mucosa is bright red and swollen, throat is normal, neck is supple, no lymphadenopathy noted, lungs are clear to auscultation, heart sounds are normal  A: Acute sinusitis  P: Augmentin 875 mg twice a day for 10 days, prednisone 30 mg daily for 3 days, Diflucan if needed

## 2017-01-14 ENCOUNTER — Telehealth: Payer: Self-pay | Admitting: *Deleted

## 2017-01-14 DIAGNOSIS — G35 Multiple sclerosis: Secondary | ICD-10-CM | POA: Diagnosis not present

## 2017-01-14 MED ORDER — ZOLPIDEM TARTRATE 5 MG PO TABS: 5.0000 mg | ORAL_TABLET | Freq: Every evening | ORAL | 0 refills | Status: DC | PRN

## 2017-01-14 MED ORDER — VALACYCLOVIR HCL 500 MG PO TABS: 500.0000 mg | ORAL_TABLET | Freq: Two times a day (BID) | ORAL | 0 refills | Status: DC

## 2017-01-14 NOTE — Telephone Encounter (Signed)
Ambien faxed to [facility] employee pharmacy as requested.  Valacyclovir escribed to same pharmacy/fim

## 2017-01-14 NOTE — Telephone Encounter (Signed)
Pt. requests Ambien to treat insomnia that she has after Lemtrada infusions.  Rx. awaiting RAS sig/fim

## 2017-01-15 ENCOUNTER — Other Ambulatory Visit: Payer: Self-pay | Admitting: Neurology

## 2017-01-15 DIAGNOSIS — G35 Multiple sclerosis: Secondary | ICD-10-CM | POA: Diagnosis not present

## 2017-01-15 MED ORDER — RIZATRIPTAN BENZOATE 10 MG PO TBDP: 10.0000 mg | ORAL_TABLET | ORAL | 3 refills | Status: DC | PRN

## 2017-01-16 DIAGNOSIS — G35 Multiple sclerosis: Secondary | ICD-10-CM | POA: Diagnosis not present

## 2017-01-21 NOTE — Telephone Encounter (Signed)
Spoke with [employer]. Since yr. 2 Lemtrada infusions last week, she has been hot/ sweaty at night and with exertion, sometimes even has to change her clothes.  One episode of feeling like she was going to pass out today. No fever or signs of infection. Has not had BP checked, but sts. husband is an Charity fundraiserN and can check it tonight/fim

## 2017-01-21 NOTE — Telephone Encounter (Signed)
Pt called and states that since her infusion on last week her blood pressure has been low.  Pt is asking for a call back, she states it is not an emergency but she would all the same appreciate a call back as soon as RN Rushie Goltz is available

## 2017-01-21 NOTE — Telephone Encounter (Signed)
Reviewed her October Lemtrada labs with RAS.  TSH is good. Per RAS, advised increased fluids, NSAIDS at night, monitor sx. and call us if any changes, new or worse. Husband to check BP. Carrie Nelson verbalized understanding of same and is agreeable with this plan, will call us back prn/fim

## 2017-01-24 ENCOUNTER — Other Ambulatory Visit
Admission: RE | Admit: 2017-01-24 | Discharge: 2017-01-24 | Disposition: A | Discharge status: Home / Self Care | Payer: 59 | Source: Ambulatory Visit | Attending: Neurology | Admitting: Neurology

## 2017-01-24 ENCOUNTER — Telehealth: Payer: Self-pay | Admitting: *Deleted

## 2017-01-24 DIAGNOSIS — G35 Multiple sclerosis: Secondary | ICD-10-CM | POA: Diagnosis not present

## 2017-01-24 DIAGNOSIS — Z79899 Other long term (current) drug therapy: Secondary | ICD-10-CM

## 2017-01-24 LAB — T4, FREE: FREE T4: 3.07 ng/dL — AB (ref 0.61–1.12)

## 2017-01-24 LAB — TSH: TSH: <0.01 u[IU]/mL — ABNORMAL LOW (ref 0.350–4.500)

## 2017-01-24 NOTE — Telephone Encounter (Signed)
[facility]  [facility] Lemtrada labs.  TSH is 0.010.  Was 1.220 on 12/10/16.  Per RAS, repeat TSH, T4 and T3 uptake to confirm, and refer to either pcp or endocrinology.Chucky May/fim

## 2017-01-25 ENCOUNTER — Telehealth: Payer: Self-pay | Admitting: *Deleted

## 2017-01-25 ENCOUNTER — Telehealth: Payer: Self-pay | Admitting: Obstetrics and Gynecology

## 2017-01-25 LAB — T3 UPTAKE: T3 Uptake Ratio: 36 % (ref 24–39)

## 2017-01-25 NOTE — Telephone Encounter (Signed)
The patient called and stated that she needs to speak with melody in regards to her

## 2017-01-25 NOTE — Telephone Encounter (Signed)
Spoke with Carrie Nelson and reveiwed lab results with her.  She verbalized understanding of same.  Endocrinology referral has been ordered.  OB/GYN has managed thyroid problems in the past for Carrie Nelson, so until she can see endocrinology, she will speak with her OB.f/im

## 2017-01-25 NOTE — Telephone Encounter (Signed)
Please let her know that I feel this would be better managed by endocrinologist.

## 2017-01-25 NOTE — Telephone Encounter (Signed)
Please look at pts labs from 01/24/17, pt is having hot flashes, feels really bad, states MNS does her thyroid

## 2017-01-25 NOTE — Telephone Encounter (Signed)
-----   Message from Asa Lente, MD sent at 01/25/2017 11:37 AM EST ----- Repeat tests confirm hyperthyroidism.   Please send labs to her PCP and setup consult with endocrinology

## 2017-01-28 DIAGNOSIS — G35 Multiple sclerosis: Secondary | ICD-10-CM | POA: Diagnosis not present

## 2017-01-28 DIAGNOSIS — F419 Anxiety disorder, unspecified: Secondary | ICD-10-CM | POA: Diagnosis not present

## 2017-01-28 DIAGNOSIS — E0580 Other thyrotoxicosis without thyrotoxic crisis or storm: Secondary | ICD-10-CM | POA: Diagnosis not present

## 2017-01-28 NOTE — Telephone Encounter (Signed)
Patient calling to advise she does not need [facility] anymore because she spoke to her PCP's office and they scheduled her with someone in their practice. A returned call is not needed unless there are questions.

## 2017-01-28 NOTE — Telephone Encounter (Signed)
Referral has been sent as urgent [facility] and relayed status of referral . [phone] . I also called and left patient a message relaying [place]'s telephone number.

## 2017-01-28 NOTE — Telephone Encounter (Signed)
Noted/fim 

## 2017-01-29 ENCOUNTER — Ambulatory Visit: Payer: 59 | Admitting: Neurology

## 2017-02-04 ENCOUNTER — Other Ambulatory Visit: Payer: Self-pay | Admitting: Obstetrics and Gynecology

## 2017-02-04 MED FILL — METFORMIN HCL ER 750 MG TAB: 750 | 30 days supply | Qty: 30 | Fill #2

## 2017-02-11 ENCOUNTER — Encounter: Payer: Self-pay | Admitting: Neurology

## 2017-02-12 DIAGNOSIS — G35 Multiple sclerosis: Secondary | ICD-10-CM | POA: Diagnosis not present

## 2017-02-12 DIAGNOSIS — Z8639 Personal history of other endocrine, nutritional and metabolic disease: Secondary | ICD-10-CM | POA: Diagnosis not present

## 2017-02-12 DIAGNOSIS — J4520 Mild intermittent asthma, uncomplicated: Secondary | ICD-10-CM | POA: Diagnosis not present

## 2017-02-22 ENCOUNTER — Telehealth: Payer: Self-pay | Admitting: Neurology

## 2017-02-22 DIAGNOSIS — E0580 Other thyrotoxicosis without thyrotoxic crisis or storm: Secondary | ICD-10-CM | POA: Diagnosis not present

## 2017-02-22 DIAGNOSIS — Z8639 Personal history of other endocrine, nutritional and metabolic disease: Secondary | ICD-10-CM | POA: Diagnosis not present

## 2017-02-22 DIAGNOSIS — M79604 Pain in right leg: Secondary | ICD-10-CM | POA: Diagnosis not present

## 2017-02-22 DIAGNOSIS — M79605 Pain in left leg: Secondary | ICD-10-CM | POA: Diagnosis not present

## 2017-02-22 NOTE — Telephone Encounter (Signed)
Pt is requesting lemtrada labs drawn from home for Nov and Dec faxed to her PCP/Dr Daniel Nones, she does not have the fax #. She is aware RN and provider will be back in the clinic on 02/27/17 and it can wait until then

## 2017-02-27 NOTE — Telephone Encounter (Signed)
November and December Lemtrada labs faxed to Dr. Daniel Nones in [place], fax# [phone]./fim

## 2017-03-25 DIAGNOSIS — Z8639 Personal history of other endocrine, nutritional and metabolic disease: Secondary | ICD-10-CM | POA: Diagnosis not present

## 2017-03-25 MED FILL — METFORMIN HCL ER 750 MG TAB: 750 | 30 days supply | Qty: 30 | Fill #3

## 2017-03-26 DIAGNOSIS — G35 Multiple sclerosis: Secondary | ICD-10-CM | POA: Diagnosis not present

## 2017-03-26 DIAGNOSIS — Z8639 Personal history of other endocrine, nutritional and metabolic disease: Secondary | ICD-10-CM | POA: Diagnosis not present

## 2017-04-03 DIAGNOSIS — E0590 Thyrotoxicosis, unspecified without thyrotoxic crisis or storm: Secondary | ICD-10-CM | POA: Diagnosis not present

## 2017-04-04 ENCOUNTER — Telehealth: Payer: Self-pay | Admitting: Neurology

## 2017-04-04 NOTE — Telephone Encounter (Signed)
Pt's lab tech from [facility] passed suddenly, she has not been able to get lemtrada labs drawn for January and February. They have assisgned her another tech but they won't come until last week in February. Pt is wanting to know if she should wait or is there something else she could do? Please call

## 2017-04-04 NOTE — Telephone Encounter (Signed)
Spoke with [employer].  Her [facility] travelling lab tech passed away unexpectedly.  [facility] can't have another tech come out until end of Feb. Last labs drawn were 11/28. With exception of her thyroid labs, other labs have been good.  She is seeing an endocrinologist at [facility] for thyroid problems. Will reach out to our Lemtrada rep to see if we can arrange for labs to be drawn sooner/fim

## 2017-04-23 ENCOUNTER — Encounter: Payer: Self-pay | Admitting: Neurology

## 2017-04-24 ENCOUNTER — Telehealth: Payer: Self-pay | Admitting: Neurology

## 2017-04-24 NOTE — Telephone Encounter (Signed)
Pt requesting a call to discuss the results from the Drug company labs for her medication. Pt didn't want to go into full detail stating it was confusing

## 2017-04-24 NOTE — Telephone Encounter (Signed)
Spoke with [employer].  TSH drawn 04/23/17 is 0.006.  I have faxed Lemtrada Labs, including TSH, to Dr. Daniel Nones and Dr. Wendall Mola at the [facility], fax# [phone]/fim

## 2017-04-24 NOTE — Telephone Encounter (Signed)
Spoke with Carrie Nelson this am.  She had Lemtrada labs drawn yesterday and wanted TSH results--I have not received them yet, but will call her once I do/fim

## 2017-04-26 DIAGNOSIS — E890 Postprocedural hypothyroidism: Secondary | ICD-10-CM

## 2017-04-26 HISTORY — DX: Postprocedural hypothyroidism: E89.0

## 2017-04-29 ENCOUNTER — Other Ambulatory Visit: Payer: Self-pay | Admitting: Obstetrics and Gynecology

## 2017-05-01 DIAGNOSIS — E0590 Thyrotoxicosis, unspecified without thyrotoxic crisis or storm: Secondary | ICD-10-CM | POA: Diagnosis not present

## 2017-05-01 MED FILL — METFORMIN HCL ER 750 MG TAB: 750 | 30 days supply | Qty: 30 | Fill #4

## 2017-05-07 ENCOUNTER — Other Ambulatory Visit: Payer: Self-pay | Admitting: Internal Medicine

## 2017-05-07 DIAGNOSIS — E0590 Thyrotoxicosis, unspecified without thyrotoxic crisis or storm: Secondary | ICD-10-CM

## 2017-05-13 ENCOUNTER — Encounter
Admission: RE | Admit: 2017-05-13 | Discharge: 2017-05-13 | Disposition: A | Discharge status: Home / Self Care | Payer: 59 | Source: Ambulatory Visit | Attending: Internal Medicine | Admitting: Internal Medicine

## 2017-05-13 DIAGNOSIS — E0590 Thyrotoxicosis, unspecified without thyrotoxic crisis or storm: Secondary | ICD-10-CM | POA: Diagnosis not present

## 2017-05-13 MED ORDER — SODIUM IODIDE I-123 7.4 MBQ CAPS
135.5000 | ORAL_CAPSULE | Freq: Once | ORAL | Status: AC
  Administered 2017-05-13: 135.5 via ORAL

## 2017-05-14 ENCOUNTER — Encounter
Admission: RE | Admit: 2017-05-14 | Discharge: 2017-05-14 | Disposition: A | Discharge status: Home / Self Care | Payer: 59 | Source: Ambulatory Visit | Attending: Internal Medicine | Admitting: Internal Medicine

## 2017-05-14 DIAGNOSIS — E0590 Thyrotoxicosis, unspecified without thyrotoxic crisis or storm: Secondary | ICD-10-CM | POA: Diagnosis not present

## 2017-05-17 ENCOUNTER — Encounter
Admission: RE | Admit: 2017-05-17 | Discharge: 2017-05-17 | Disposition: A | Discharge status: Home / Self Care | Payer: 59 | Source: Ambulatory Visit | Attending: Internal Medicine | Admitting: Internal Medicine

## 2017-05-17 DIAGNOSIS — E210 Primary hyperparathyroidism: Secondary | ICD-10-CM | POA: Diagnosis not present

## 2017-05-17 DIAGNOSIS — E0590 Thyrotoxicosis, unspecified without thyrotoxic crisis or storm: Secondary | ICD-10-CM | POA: Diagnosis not present

## 2017-05-17 MED ORDER — SODIUM IODIDE I 131 CAPSULE
12.0480 | Freq: Once | INTRAVENOUS | Status: AC | PRN
  Administered 2017-05-17: 12.048 via ORAL

## 2017-05-27 MED FILL — METFORMIN HCL ER 750 MG TAB: 750 | 30 days supply | Qty: 30 | Fill #5

## 2017-05-28 ENCOUNTER — Encounter: Payer: Self-pay | Admitting: *Deleted

## 2017-06-24 ENCOUNTER — Other Ambulatory Visit: Payer: Self-pay | Admitting: Obstetrics and Gynecology

## 2017-06-24 ENCOUNTER — Encounter: Payer: Self-pay | Admitting: Neurology

## 2017-06-24 ENCOUNTER — Other Ambulatory Visit: Payer: Self-pay

## 2017-06-24 ENCOUNTER — Ambulatory Visit: Payer: 59 | Admitting: Neurology

## 2017-06-24 DIAGNOSIS — R269 Unspecified abnormalities of gait and mobility: Secondary | ICD-10-CM

## 2017-06-24 DIAGNOSIS — R5383 Other fatigue: Secondary | ICD-10-CM

## 2017-06-24 DIAGNOSIS — E0590 Thyrotoxicosis, unspecified without thyrotoxic crisis or storm: Secondary | ICD-10-CM

## 2017-06-24 DIAGNOSIS — E119 Type 2 diabetes mellitus without complications: Secondary | ICD-10-CM

## 2017-06-24 DIAGNOSIS — G35 Multiple sclerosis: Secondary | ICD-10-CM | POA: Diagnosis not present

## 2017-06-24 MED FILL — METFORMIN HCL ER 750 MG TAB: 750 | 30 days supply | Qty: 30 | Fill #0

## 2017-06-24 NOTE — Progress Notes (Signed)
[facility]  PATIENT: Carrie Nelson DOB: 1980/04/01  REFERRING CLINICIAN: Daniel Nones HISTORY FROM: Patient   REASON FOR VISIT: Multiple Sclerosis   HISTORICAL  CHIEF COMPLAINT:  Chief Complaint  Patient presents with  . Multiple Sclerosis    Had 2nd yr. Lemtrada infusions in November 2018.  Dx. with hyperthyroidism and had one dose of radioactive Iodine 5 wks. ago/fim                                               f  HISTORY OF PRESENT ILLNESS:  [name] is a 37 y.o. woman who was diagnosed with MS in 2007.    Update 06/24/2017: He had a second course of Lemtrada infusions November 2018.  She tolerated the infusions well with only mild .  Her MS has been stable she has not had any exacerbations.  She is compliant with monthly REMS monitoring.  She was recently diagnosed with hyperthyroidism and had 1 dose of radioactive iodine 5 weeks ago.  She feels gait is doing about the same.  The right leg is mildly clumsy compared to the left leg but she is not having any falls.  She notes no difficulties with her bladder.    Vision is fine.  She continues to have some fatigue both physical and mental.  Concerta was helping but she stopped when the hyperthyroidism was diagnosed.  .   She has  mild difficulties with insomnia since the thyroid issues.   She denies cognitive issues.  She has no problems with mood.  Update 12/25/2016:    She had her first treatment with [facility] November 2017 and will have her second 3 day course next month. She feels her MS has been very stable since starting [place]. Specifically there has not been any exacerbations. She had a few mild infusion reactions the week of the infusions but was fine by the next Monday. She feels she is stable neurologically with no new numbness or weakness. The right leg is mildly clumsier and slightly weaker than the left leg. She notes this mostly when she tries to move fast. She denies any difficulties with her bladder. There is no problems with vision.  She has fatigue, physical and mental.   Concerta helps best when she takes mid morning.    Most days, she just takes one dose in the mid morning.  By taking it later, she has not needed the second dose.  She sleeps well at night.    She denies cognitive issues.     She denies any significant depression or anxiety.   She is on low dose Prozac with benefit   From 08/02/2015  MS:   She had her first week of Lemtrada infusions November 13 through 01/13/2016. She will get 3 additional days in November 2018. She tolerated the infusions fairly well though she did have some chills and headaches. She also felt weaker the next week.   Fatigue continues to be her major problem.  Gait/strength/sensation:    Gait strength and sensation are generally doing well. She sometimes drags the right leg when she is more tired notices that the right leg is weak when she climbs stairs sometimes there will be mild dysesthesias in that leg but they are never troubling.   Bladder: She denies urinary frequency, urgency or hesitancy..              Vision:   She denies any MS related vision problem. e.     Fatigue/sleep/cognition:     She has more fatigue than last year.   Concerta 36 mg once or twice a  day helps.  She usually takes the first dose in the morning and will take a second dose around noon some days. Besides MS fatigue, she notes less sleepiness in the afternoons.   She sleeps well at night.     She notes that her decreased focus is also better.    She denies memory problems.     Mood:   She denies any depression.  She notes mild anxiety helped by Prozac 10 mg   MS History:   She was diagnosed in 2007 but,  in retrospect, had right foot dragging that started around 2005. Then shortly after that she had an episode of left facial numbness. In 2007 she had an episode of slurred speech and worsening clumsiness.  MRI of the brain that was consistent with multiple sclerosis. She also had a lumbar puncture and her CSF compatible with the diagnosis of MS. Most of her symptoms improved over the next few months but she did not get 100% back to baseline and she continues to have minimal residual weakness, clumsiness and numbness. She was initially placed on Betaseron but switched to Tysabri because of some generalized side effects of Betaseron. She was seeing Dr. Tinnie Gens 2007 until 2014.   She was on Tysabri between 2008 and late 2014 when she stopped because of a desire to get pregnant. She became pregnant in early 2015 and delivered in November 2015. She had not been on any disease modifying therapy since late 2014 and then started Tecfidera 02/2014.Marland Kitchen   She did not tolerate it well and started [facility] 12/2015.     REVIEW OF SYSTEMS:  Constitutional: No fevers, chills, sweats, or change in appetite Eyes: No visual changes, double vision, eye pain Ear, nose and throat: No hearing loss, ear pain, nasal congestion, sore throat Cardiovascular: No chest pain, palpitations Respiratory:  No shortness of breath at rest or with exertion.   No wheezes GastrointestinaI: No nausea, vomiting, diarrhea, abdominal pain, fecal incontinence Genitourinary:  No dysuria, urinary retention or frequency.  No  nocturia. Musculoskeletal:  No neck pain, back pain Integumentary: No rash, pruritus, skin lesions Neurological: as above Psychiatric: No depression at this time.  No anxiety.   Occasionally she has irritability. Endocrine: She she is borderline diabetic. No palpitations, diaphoresis, change in appetite, change in weigh or increased thirst Hematologic/Lymphatic:  No anemia, purpura, petechiae. Allergic/Immunologic: She has seasonal allergies and is treated with several medications.  ALLERGIES: No Known Allergies  HOME MEDICATIONS: Outpatient Medications Prior to Visit  Medication Sig Dispense Refill  . Alemtuzumab (LEMTRADA) 12 MG/1.2ML SOLN Inject 12 mg into the vein.    . Cetirizine HCl (ZYRTEC ALLERGY) 10 MG CAPS Take 10 mg by mouth daily.    . cholecalciferol (VITAMIN D) 1000 UNITS tablet Take 2,000 Units by mouth daily.     Marland Kitchen FLUoxetine (PROZAC) 10 MG capsule TAKE 1 CAPSULE BY MOUTH ONCE DAILY 30 capsule 4  . LARIN 1.5/30 1.5-30 MG-MCG tablet TAKE 1 TABLET BY MOUTH DAILY 63 tablet 5  . metFORMIN (GLUCOPHAGE-XR) 750 MG 24 hr tablet TAKE 1 TABLET BY MOUTH DAILY WITH BREAKFAST. 30 tablet 5  . methylphenidate 36 MG PO CR tablet Take one to two tablets daily as needed. 90 tablet 0  . Multiple Vitamins-Minerals (MULTIVITAMIN PO) Take by mouth.    . rizatriptan (MAXALT-MLT) 10 MG disintegrating tablet Take 1 tablet (10 mg total) by mouth as needed for migraine. May repeat in 2 hours if needed 27 tablet 3  . amoxicillin-clavulanate (AUGMENTIN) 875-125 MG tablet Take 1 tablet 2 (two) times daily by mouth. 20 tablet 0  . azelastine (ASTELIN) 0.1 % nasal spray   7  . Azelastine-Fluticasone (DYMISTA) 137-50 MCG/ACT SUSP 1 spray by Nasal route 2 (two) times daily.    Marland Kitchen azithromycin (ZITHROMAX) 250 MG tablet Take 2 tabs now then 1 daily times 4 days (Patient not taking: Reported on 01/04/2017) 6 tablet 0  .  benzonatate (TESSALON PERLES) 100 MG capsule Take 1-2 capsules (100-200 mg total) by mouth  every 8 (eight) hours as needed for cough. 30 capsule 0  . fluconazole (DIFLUCAN) 150 MG tablet Take one now and one in a week 2 tablet 0  . fluticasone (FLONASE) 50 MCG/ACT nasal spray Place into the nose.    . levothyroxine (SYNTHROID) 25 MCG tablet Take 1 tablet (25 mcg total) by mouth daily. (Patient not taking: Reported on 06/24/2017) 90 tablet 4  . predniSONE (DELTASONE) 10 MG tablet Take 3 tablets (30 mg total) daily with breakfast by mouth. 9 tablet 0  . valACYclovir (VALTREX) 500 MG tablet Take 1 tablet (500 mg total) 2 (two) times daily by mouth. 180 tablet 0  . zolpidem (AMBIEN) 5 MG tablet Take 1 tablet (5 mg total) at bedtime as needed by mouth for sleep. 10 tablet 0   Facility-Administered Medications Prior to Visit  Medication Dose Route Frequency Provider Last Rate Last Dose  . gadopentetate dimeglumine (MAGNEVIST) injection 16 mL  16 mL Intravenous Once PRN Sater, Pearletha Furl, MD        PAST MEDICAL HISTORY: Past Medical History:  Diagnosis Date  . Multiple sclerosis (HCC)   . Syncope   . Thyroid disease   . Tremor     PAST SURGICAL HISTORY: Past Surgical History:  Procedure Laterality Date  . BUNIONECTOMY    . FOOT SURGERY    . KNEE ARTHROSCOPY W/ ACL RECONSTRUCTION Left 2010    FAMILY HISTORY: Family History  Problem Relation Age of Onset  . Prostate cancer Father   . Diabetes Father   . Arthritis Unknown   . Dementia Unknown   . Hypertension Unknown   . Heart disease Unknown     SOCIAL HISTORY:  Social History   Socioeconomic History  . Marital status: Married    Spouse name: Not on file  . Number of children: o  . Years of education: Not on file  . Highest education level: Not on file  Occupational History  . Not on file  Social Needs  . Financial resource strain: Not on file  . Food insecurity:    Worry: Not on file    Inability: Not on file  . Transportation needs:    Medical: Not on file    Non-medical: Not on file  Tobacco Use  .  Smoking status: Never Smoker  . Smokeless tobacco: Never Used  Substance and Sexual Activity  . Alcohol use: Yes    Comment: minimal weekly   . Drug use: No  . Sexual activity: Yes    Birth control/protection: Pill  Lifestyle  . Physical activity:    Days per week: Not on file    Minutes per session: Not on file  . Stress: Not on file  Relationships  . Social connections:    Talks on phone: Not on file    Gets together: Not on file    Attends religious service: Not on file    Active member of club or organization: Not on file    Attends meetings of clubs or organizations: Not on file    Relationship status: Not on file  . Intimate partner violence:    Fear of current or ex partner: Not on file    Emotionally abused: Not on file    Physically abused: Not on file    Forced sexual activity: Not on file  Other Topics Concern  . Not on file  Social History Narrative  Patient lives with parents.   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    Patient has a Probation officer.      PHYSICAL EXAM  There were no vitals filed for this visit.  There is no height or weight on file to calculate BMI.   General: The patient is well-developed and well-nourished and in no acute distress   Neurologic Exam  Mental status: The patient is alert and oriented x 3 at the time of the examination. The patient has apparent normal recent and remote memory, with an apparently normal attention span and concentration ability.   Speech is normal.  Cranial nerves:   Extraocular muscles are intact.  Facial strength and sensation is normal.  Trapezius strength is normal.  Hearing is normal symmetric  Motor:  Muscle bulk and tone are normal. Strength is  5 / 5 in all 4 extremities.   Sensory: Sensory testing is intact to touch and vibration in the arms and legs.  Coordination: She has good finger-nose-finger and heel-to-shin  bilaterally.  Gait and station: Station and gait are normal. The tandem gait is mildly wide.. Romberg is negative.   Reflexes: Deep tendon reflexes are symmetric and normal in arms. The right knee reflex is mildly increased compared to the left.  Symmetric ankle reflexes    DIAGNOSTIC DATA (LABS, IMAGING, TESTING) - I reviewed patient records, labs, notes, testing and imaging myself where available.  Lab Results  Component Value Date   WBC 4.1 10/18/2015   HGB 13.1 10/18/2015   HCT 39.7 10/18/2015   MCV 93 10/18/2015   PLT 377 10/18/2015      Component Value Date/Time   NA 141 10/18/2015 1615   NA 138 05/09/2012 0830   K 4.3 10/18/2015 1615   K 3.7 05/09/2012 0830   CL 101 10/18/2015 1615   CL 108 (H) 05/09/2012 0830   CO2 23 10/18/2015 1615   CO2 23 05/09/2012 0830   GLUCOSE 102 (H) 10/18/2015 1615   GLUCOSE 84 05/09/2012 0830   BUN 13 10/18/2015 1615   BUN 16 05/09/2012 0830   CREATININE 0.62 10/18/2015 1615   CREATININE 0.95 05/09/2012 0830   CALCIUM 9.7 10/18/2015 1615   CALCIUM 8.6 05/09/2012 0830   PROT 6.7 10/18/2015 1615   PROT 6.7 05/09/2012 0830   ALBUMIN 4.2 10/18/2015 1615   ALBUMIN 3.4 05/09/2012 0830   AST 12 10/18/2015 1615   AST 17 05/09/2012 0830   ALT 9 10/18/2015 1615   ALT 16 05/09/2012 0830   ALKPHOS 43 10/18/2015 1615   ALKPHOS 55 05/09/2012 0830   BILITOT 0.3 10/18/2015 1615   BILITOT 0.7 05/09/2012 0830   GFRNONAA 116 10/18/2015 1615   GFRNONAA >60 05/09/2012 0830   GFRAA 134 10/18/2015 1615   GFRAA >60 05/09/2012 0830        ASSESSMENT AND PLAN  Multiple sclerosis (HCC)  Type 2 diabetes mellitus without complication, without long-term current use of insulin (HCC)  Other fatigue  Gait disturbance  Hyperthyroidism   1.   Continue with the REMS monitoring. She has been very compliant. CD4 counts are closer to  normal.  2.  She is advised to remain active and exercises as tolerated. 3.   She will follow-up with endocrinology  for her hypothyroidism.   4.   She will continue Concerta 36 mg prn.  5.   Return in 6 months or call sooner if new or worsening neurologic symptoms.  Richard A. Epimenio Foot, MD, PhD 06/24/2017, 2:49 PM Certified in Neurology, Clinical Neurophysiology, Sleep Medicine, Pain Medicine and Neuroimaging  [facility] [address] [place], [place] [address] [phone]-

## 2017-06-25 ENCOUNTER — Encounter: Payer: Self-pay | Admitting: Neurology

## 2017-06-26 DIAGNOSIS — E0590 Thyrotoxicosis, unspecified without thyrotoxic crisis or storm: Secondary | ICD-10-CM | POA: Diagnosis not present

## 2017-07-03 DIAGNOSIS — E0590 Thyrotoxicosis, unspecified without thyrotoxic crisis or storm: Secondary | ICD-10-CM | POA: Diagnosis not present

## 2017-07-23 DIAGNOSIS — E0590 Thyrotoxicosis, unspecified without thyrotoxic crisis or storm: Secondary | ICD-10-CM | POA: Diagnosis not present

## 2017-07-30 DIAGNOSIS — H16223 Keratoconjunctivitis sicca, not specified as Sjogren's, bilateral: Secondary | ICD-10-CM | POA: Diagnosis not present

## 2017-08-01 ENCOUNTER — Other Ambulatory Visit: Payer: Self-pay | Admitting: *Deleted

## 2017-08-01 DIAGNOSIS — Z79899 Other long term (current) drug therapy: Secondary | ICD-10-CM

## 2017-08-01 DIAGNOSIS — G35 Multiple sclerosis: Secondary | ICD-10-CM

## 2017-08-01 MED ORDER — PROPRANOLOL HCL ER 60 MG PO CP24: 60.0000 mg | ORAL_CAPSULE | Freq: Every day | ORAL | 1 refills | Status: DC

## 2017-08-02 ENCOUNTER — Other Ambulatory Visit
Admission: RE | Admit: 2017-08-02 | Discharge: 2017-08-02 | Disposition: A | Discharge status: Home / Self Care | Payer: 59 | Source: Ambulatory Visit | Attending: Neurology | Admitting: Neurology

## 2017-08-02 DIAGNOSIS — Z79899 Other long term (current) drug therapy: Secondary | ICD-10-CM | POA: Diagnosis not present

## 2017-08-02 DIAGNOSIS — G35 Multiple sclerosis: Secondary | ICD-10-CM | POA: Insufficient documentation

## 2017-08-02 LAB — BASIC METABOLIC PANEL
Anion gap: 9 (ref 5–15)
BUN: 16 mg/dL (ref 6–20)
CO2: 24 mmol/L (ref 22–32)
Calcium: 9.5 mg/dL (ref 8.9–10.3)
Chloride: 103 mmol/L (ref 101–111)
Creatinine, Ser: 1.02 mg/dL — ABNORMAL HIGH (ref 0.44–1.00)
GFR calc Af Amer: >60 mL/min (ref >60)
GFR calc non Af Amer: >60 mL/min (ref >60)
Glucose, Bld: 98 mg/dL (ref 65–99)
Potassium: 3.7 mmol/L (ref 3.5–5.1)
Sodium: 136 mmol/L (ref 135–145)

## 2017-08-06 ENCOUNTER — Telehealth: Payer: Self-pay | Admitting: *Deleted

## 2017-08-06 MED ORDER — METHYLPHENIDATE HCL ER (OSM) 36 MG PO TBCR: EXTENDED_RELEASE_TABLET | ORAL | 0 refills | Status: DC

## 2017-08-06 NOTE — Telephone Encounter (Signed)
Pt. requested r/f of Concerta. Request sent to [facility]/fim

## 2017-08-21 ENCOUNTER — Encounter: Payer: Self-pay | Admitting: Neurology

## 2017-09-02 ENCOUNTER — Other Ambulatory Visit: Payer: Self-pay | Admitting: Neurology

## 2017-09-05 DIAGNOSIS — E0590 Thyrotoxicosis, unspecified without thyrotoxic crisis or storm: Secondary | ICD-10-CM | POA: Diagnosis not present

## 2017-09-12 DIAGNOSIS — E890 Postprocedural hypothyroidism: Secondary | ICD-10-CM | POA: Diagnosis not present

## 2017-09-17 MED FILL — METFORMIN HCL ER 750 MG TAB: 750 | 30 days supply | Qty: 30 | Fill #1

## 2017-09-19 ENCOUNTER — Encounter: Payer: Self-pay | Admitting: Podiatry

## 2017-09-19 ENCOUNTER — Ambulatory Visit: Payer: 59 | Admitting: Podiatry

## 2017-09-19 DIAGNOSIS — L600 Ingrowing nail: Secondary | ICD-10-CM

## 2017-09-19 DIAGNOSIS — J302 Other seasonal allergic rhinitis: Secondary | ICD-10-CM | POA: Insufficient documentation

## 2017-09-19 MED ORDER — NEOMYCIN-POLYMYXIN-HC 3.5-10000-1 OT SOLN: OTIC | 0 refills | Status: DC

## 2017-09-19 NOTE — Progress Notes (Signed)
Subjective:  Patient ID: Carrie Nelson, female    DOB: 1981-02-06,  MRN: [id number] HPI Chief Complaint  Patient presents with  . Nail Problem    Patient presents today for ingrown toenail right 4th toe x 4 days.  She reports her toe is infected and has been red, very sore to touch and has had green drainage"  She has tried to "dig out" the ingrown and uses peroxide to clean    37 y.o. female presents with the above complaint.   ROS: Denies fever chills nausea vomiting muscle aches and pains.  Past Medical History:  Diagnosis Date  . Multiple sclerosis (HCC)   . Syncope   . Thyroid disease   . Tremor    Past Surgical History:  Procedure Laterality Date  . BUNIONECTOMY    . FOOT SURGERY    . KNEE ARTHROSCOPY W/ ACL RECONSTRUCTION Left 2010    Current Outpatient Medications:  .  ALPRAZolam (XANAX) 0.5 MG tablet, Take 0.5 mg by mouth at bedtime as needed for anxiety., Disp: , Rfl:  .  propranolol (INDERAL) 20 MG tablet, Take 20 mg by mouth 3 (three) times daily., Disp: , Rfl:  .  Alemtuzumab (LEMTRADA) 12 MG/1.2ML SOLN, Inject 12 mg into the vein., Disp: , Rfl:  .  aspirin EC 81 MG tablet, Take by mouth., Disp: , Rfl:  .  Cetirizine HCl (ZYRTEC ALLERGY) 10 MG CAPS, Take 10 mg by mouth daily., Disp: , Rfl:  .  cholecalciferol (VITAMIN D) 1000 UNITS tablet, Take 2,000 Units by mouth daily. , Disp: , Rfl:  .  FLUoxetine (PROZAC) 10 MG capsule, TAKE 1 CAPSULE BY MOUTH ONCE DAILY, Disp: 30 capsule, Rfl: 4 .  LARIN 1.5/30 1.5-30 MG-MCG tablet, TAKE 1 TABLET BY MOUTH DAILY, Disp: 63 tablet, Rfl: 5 .  levothyroxine (SYNTHROID) 25 MCG tablet, Take 1 tablet (25 mcg total) by mouth daily. (Patient not taking: Reported on 06/24/2017), Disp: 90 tablet, Rfl: 4 .  levothyroxine (SYNTHROID, LEVOTHROID) 100 MCG tablet, , Disp: , Rfl: 5 .  metFORMIN (GLUCOPHAGE-XR) 750 MG 24 hr tablet, TAKE 1 TABLET BY MOUTH DAILY WITH BREAKFAST., Disp: 30 tablet, Rfl: 5 .  methylphenidate (CONCERTA) 36 MG PO  CR tablet, TAKE 1 TO 2 TABLETS BY MOUTH DAILY AS NEEDED., Disp: 90 tablet, Rfl: 0 .  montelukast (SINGULAIR) 10 MG tablet, , Disp: , Rfl: 3 .  Multiple Vitamins-Minerals (MULTIVITAMIN PO), Take by mouth., Disp: , Rfl:  .  neomycin-polymyxin-hydrocortisone (CORTISPORIN) OTIC solution, Apply one to two drops to toe after soaking twice daily., Disp: 10 mL, Rfl: 0 .  rizatriptan (MAXALT-MLT) 10 MG disintegrating tablet, Take 1 tablet (10 mg total) by mouth as needed for migraine. May repeat in 2 hours if needed, Disp: 27 tablet, Rfl: 3 .  saccharomyces boulardii (FLORASTOR) 250 MG capsule, Take by mouth., Disp: , Rfl:  .  XIIDRA 5 % SOLN, , Disp: , Rfl: 6 No current facility-administered medications for this visit.   Facility-Administered Medications Ordered in Other Visits:  .  gadopentetate dimeglumine (MAGNEVIST) injection 16 mL, 16 mL, Intravenous, Once PRN, Sater, Pearletha Furl, MD  No Known Allergies Review of Systems Objective:  There were no vitals filed for this visit.  General: Well developed, nourished, in no acute distress, alert and oriented x3   Dermatological: Skin is warm, dry and supple bilateral. Nails x 10 are well maintained; remaining integument appears unremarkable at this time. There are no open sores, no preulcerative lesions, no rash or signs of  infection present.  Sharp incurvated nail margin along the tibial border of the fourth digit of the right foot.  Moderate erythema no cellulitis drainage or odor.  Vascular: Dorsalis Pedis artery and Posterior Tibial artery pedal pulses are 2/4 bilateral with immedate capillary fill time. Pedal hair growth present. No varicosities and no lower extremity edema present bilateral.   Neruologic: Grossly intact via light touch bilateral. Vibratory intact via tuning fork bilateral. Protective threshold with Semmes Wienstein monofilament intact to all pedal sites bilateral. Patellar and Achilles deep tendon reflexes 2+ bilateral. No Babinski  or clonus noted bilateral.   Musculoskeletal: No gross boney pedal deformities bilateral. No pain, crepitus, or limitation noted with foot and ankle range of motion bilateral. Muscular strength 5/5 in all groups tested bilateral.  Gait: Unassisted, Nonantalgic.    Radiographs:  None taken  Assessment & Plan:   Assessment: Ingrown toenail fourth digit right foot.  Plan: After local anesthesia was administered to the fourth digit of the right foot chemical matrixectomy was performed.  She tolerated procedure well without complications.  Placed her on a Cortisporin Otic prescription as well as started daily soaks both oral and written: Home-going instructions were provided.  Follow-up with her in 2 weeks.      T. [place], [place]

## 2017-09-19 NOTE — Patient Instructions (Addendum)
ANTIBACTERIAL SOAP INSTRUCTIONS  THE DAY AFTER PROCEDURE  Please follow the instructions your doctor has marked.   Shower as usual. Before getting out, place a drop of antibacterial liquid soap (Dial) on a wet, clean washcloth.  Gently wipe washcloth over affected area.  Afterward, rinse the area with warm water.  Blot the area dry with a soft cloth and cover with antibiotic ointment (neosporin, polysporin, bacitracin) and band aid or gauze and tape Place 3-4 drops of antibacterial liquid soap in a quart of warm tap water.  Submerge foot into water for 20 minutes.  If bandage was applied after your procedure, leave on to allow for easy lift off, then remove and continue with soak for the remaining time.  Next, blot area dry with a soft cloth and cover with a bandage.  Apply other medications as directed by your doctor, such as cortisporin otic solution (eardrops) or neosporin antibiotic ointmentLong Term Care Instructions-Post Nail Surgery  You have had your ingrown toenail and root treated with a chemical.  This chemical causes a burn that will drain and ooze like a blister.  This can drain for 6-8 weeks or longer.  It is important to keep this area clean, covered, and follow the soaking instructions dispensed at the time of your surgery.  This area will eventually dry and form a scab.  Once the scab forms you no longer need to soak or apply a dressing.  If at any time you experience an increase in pain, redness, swelling, or drainage, you should contact the office as soon as possible.Betadine Soak Instructions  Purchase an 8 oz. bottle of BETADINE solution (Povidone)  THE DAY AFTER THE PROCEDURE  Place 1 tablespoon of betadine solution in a quart of warm tap water.  Submerge your foot or feet with outer bandage intact for the initial soak; this will allow the bandage to become moist and wet for easy lift off.  Once you remove your bandage, continue to soak in the solution for 20 minutes.  This soak  should be done twice a day.  Next, remove your foot or feet from solution, blot dry the affected area and cover.  You may use a band aid large enough to cover the area or use gauze and tape.  Apply other medications to the area as directed by the doctor such as cortisporin otic solution (ear drops) or neosporin.   IF YOUR SKIN BECOMES IRRITATED WHILE USING THESE INSTRUCTIONS, IT IS OKAY TO SWITCH TO EPSOM SALTS AND WATER OR WHITE VINEGAR AND WATER. 

## 2017-09-30 ENCOUNTER — Other Ambulatory Visit: Payer: Self-pay | Admitting: Obstetrics and Gynecology

## 2017-10-02 ENCOUNTER — Encounter: Payer: Self-pay | Admitting: Neurology

## 2017-10-03 ENCOUNTER — Ambulatory Visit (INDEPENDENT_AMBULATORY_CARE_PROVIDER_SITE_OTHER): Payer: 59

## 2017-10-03 DIAGNOSIS — L600 Ingrowing nail: Secondary | ICD-10-CM

## 2017-10-03 NOTE — Patient Instructions (Signed)

## 2017-10-04 NOTE — Progress Notes (Signed)
Patient presents for follow-up appointment.  Procedure performed was partial nail avulsion border removal fourth digit right foot.  Procedure performed on 09/19/2017.  She states that this time she is not having any pain, but is concerned about the blister of her toe. She states that the instructions given to her were confusing so she did not soak the toe as directed.   Noted well-healing surgical site on fourth toe, I did note a small blister at the base of the nail, probably due to reaction from chemical application.  No redness, no erythema, no drainage, no other signs and symptoms of infection.  Area of scabbed over.  Discussed signs and symptoms of infection, discussed importance of soaking her toe until there is no drainage, no pain, until area is completely healed.  Written instructions were given for Epson salt soaks, expressed understanding of post op care. She is to follow up with any questions or concerns or changes in symptoms.

## 2017-10-10 DIAGNOSIS — Z0000 Encounter for general adult medical examination without abnormal findings: Secondary | ICD-10-CM | POA: Diagnosis not present

## 2017-10-10 DIAGNOSIS — G35 Multiple sclerosis: Secondary | ICD-10-CM | POA: Diagnosis not present

## 2017-10-10 DIAGNOSIS — J4520 Mild intermittent asthma, uncomplicated: Secondary | ICD-10-CM | POA: Diagnosis not present

## 2017-10-10 DIAGNOSIS — Z8639 Personal history of other endocrine, nutritional and metabolic disease: Secondary | ICD-10-CM | POA: Diagnosis not present

## 2017-10-11 DIAGNOSIS — E890 Postprocedural hypothyroidism: Secondary | ICD-10-CM | POA: Diagnosis not present

## 2017-10-14 MED FILL — METFORMIN HCL ER 750 MG TAB: 750 | 30 days supply | Qty: 30 | Fill #2

## 2017-11-05 ENCOUNTER — Encounter: Payer: Self-pay | Admitting: Neurology

## 2017-11-07 ENCOUNTER — Ambulatory Visit (INDEPENDENT_AMBULATORY_CARE_PROVIDER_SITE_OTHER): Payer: 59 | Admitting: Obstetrics and Gynecology

## 2017-11-07 ENCOUNTER — Encounter: Payer: Self-pay | Admitting: Obstetrics and Gynecology

## 2017-11-07 VITALS — BP 103/63 | HR 85 | Ht 62.0 in | Wt 194.1 lb

## 2017-11-07 DIAGNOSIS — Z01419 Encounter for gynecological examination (general) (routine) without abnormal findings: Secondary | ICD-10-CM

## 2017-11-07 DIAGNOSIS — E0590 Thyrotoxicosis, unspecified without thyrotoxic crisis or storm: Secondary | ICD-10-CM | POA: Diagnosis not present

## 2017-11-07 NOTE — Patient Instructions (Signed)
Preventive Care 18-39 Years, Female Preventive care refers to lifestyle choices and visits with your health care provider that can promote health and wellness. What does preventive care include?  A yearly physical exam. This is also called an annual well check.  Dental exams once or twice a year.  Routine eye exams. Ask your health care provider how often you should have your eyes checked.  Personal lifestyle choices, including: ? Daily care of your teeth and gums. ? Regular physical activity. ? Eating a healthy diet. ? Avoiding tobacco and drug use. ? Limiting alcohol use. ? Practicing safe sex. ? Taking vitamin and mineral supplements as recommended by your health care provider. What happens during an annual well check? The services and screenings done by your health care provider during your annual well check will depend on your age, overall health, lifestyle risk factors, and family history of disease. Counseling Your health care provider may ask you questions about your:  Alcohol use.  Tobacco use.  Drug use.  Emotional well-being.  Home and relationship well-being.  Sexual activity.  Eating habits.  Work and work Statistician.  Method of birth control.  Menstrual cycle.  Pregnancy history.  Screening You may have the following tests or measurements:  Height, weight, and BMI.  Diabetes screening. This is done by checking your blood sugar (glucose) after you have not eaten for a while (fasting).  Blood pressure.  Lipid and cholesterol levels. These may be checked every 5 years starting at age 38.  Skin check.  Hepatitis C blood test.  Hepatitis B blood test.  Sexually transmitted disease (STD) testing.  BRCA-related cancer screening. This may be done if you have a family history of breast, ovarian, tubal, or peritoneal cancers.  Pelvic exam and Pap test. This may be done every 3 years starting at age 38. Starting at age 30, this may be done  every 5 years if you have a Pap test in combination with an HPV test.  Discuss your test results, treatment options, and if necessary, the need for more tests with your health care provider. Vaccines Your health care provider may recommend certain vaccines, such as:  Influenza vaccine. This is recommended every year.  Tetanus, diphtheria, and acellular pertussis (Tdap, Td) vaccine. You may need a Td booster every 10 years.  Varicella vaccine. You may need this if you have not been vaccinated.  HPV vaccine. If you are 39 or younger, you may need three doses over 6 months.  Measles, mumps, and rubella (MMR) vaccine. You may need at least one dose of MMR. You may also need a second dose.  Pneumococcal 13-valent conjugate (PCV13) vaccine. You may need this if you have certain conditions and were not previously vaccinated.  Pneumococcal polysaccharide (PPSV23) vaccine. You may need one or two doses if you smoke cigarettes or if you have certain conditions.  Meningococcal vaccine. One dose is recommended if you are age 68-21 years and a first-year college student living in a residence hall, or if you have one of several medical conditions. You may also need additional booster doses.  Hepatitis A vaccine. You may need this if you have certain conditions or if you travel or work in places where you may be exposed to hepatitis A.  Hepatitis B vaccine. You may need this if you have certain conditions or if you travel or work in places where you may be exposed to hepatitis B.  Haemophilus influenzae type b (Hib) vaccine. You may need this  if you have certain risk factors.  Talk to your health care provider about which screenings and vaccines you need and how often you need them. This information is not intended to replace advice given to you by your health care provider. Make sure you discuss any questions you have with your health care provider. Document Released: 04/10/2001 Document Revised:  11/02/2015 Document Reviewed: 12/14/2014 Elsevier Interactive Patient Education  2018 Elsevier Inc.  

## 2017-11-07 NOTE — Progress Notes (Signed)
Subjective:   Carrie Nelson is a 37 y.o. G41P1001 Caucasian female here for a routine well-woman exam.  No LMP recorded. (Menstrual status: Oral contraceptives).    Current complaints: desires second opinion with endocrinologist. PCP: Marikay Alar       doesn't desire labs  Social History: Sexual: heterosexual Marital Status: married Living situation: with family Occupation: education at [employer] Tobacco/alcohol: no tobacco use Illicit drugs: no history of illicit drug use  The following portions of the patient's history were reviewed and updated as appropriate: allergies, current medications, past family history, past medical history, past social history, past surgical history and problem list.  Past Medical History Past Medical History:  Diagnosis Date  . Multiple sclerosis (HCC)   . Syncope   . Thyroid disease   . Tremor     Past Surgical History Past Surgical History:  Procedure Laterality Date  . BUNIONECTOMY    . FOOT SURGERY    . KNEE ARTHROSCOPY W/ ACL RECONSTRUCTION Left 2010    Gynecologic History G1P1001  No LMP recorded. (Menstrual status: Oral contraceptives). Contraception: OCP (estrogen/progesterone) Last Pap: 2018. Results were: normal   Obstetric History OB History  Gravida Para Term Preterm AB Living  1 1 1     1   SAB TAB Ectopic Multiple Live Births          1    # Outcome Date GA Lbr Len/2nd Weight Sex Delivery Anes PTL Lv  1 Term 01/22/14    M CS-LTranv   LIV    Current Medications Current Outpatient Medications on File Prior to Visit  Medication Sig Dispense Refill  . ALPRAZolam (XANAX) 0.5 MG tablet Take 0.5 mg by mouth at bedtime as needed for anxiety.    Marland Kitchen aspirin EC 81 MG tablet Take by mouth.    . Cetirizine HCl (ZYRTEC ALLERGY) 10 MG CAPS Take 10 mg by mouth daily.    . cholecalciferol (VITAMIN D) 1000 UNITS tablet Take 2,000 Units by mouth daily.     Marland Kitchen FLUoxetine (PROZAC) 10 MG capsule TAKE 1 CAPSULE BY MOUTH ONCE DAILY 30 capsule  4  . LARIN 1.5/30 1.5-30 MG-MCG tablet TAKE 1 TABLET BY MOUTH DAILY 63 tablet 5  . Levothyroxine Sodium 175 MCG CAPS Take by mouth.    . metFORMIN (GLUCOPHAGE-XR) 750 MG 24 hr tablet TAKE 1 TABLET BY MOUTH DAILY WITH BREAKFAST. 30 tablet 5  . methylphenidate (CONCERTA) 36 MG PO CR tablet TAKE 1 TO 2 TABLETS BY MOUTH DAILY AS NEEDED. 90 tablet 0  . montelukast (SINGULAIR) 10 MG tablet   3  . Multiple Vitamins-Minerals (MULTIVITAMIN PO) Take by mouth.    . propranolol (INDERAL) 20 MG tablet Take 20 mg by mouth 3 (three) times daily.    . rizatriptan (MAXALT-MLT) 10 MG disintegrating tablet Take 1 tablet (10 mg total) by mouth as needed for migraine. May repeat in 2 hours if needed 27 tablet 3  . saccharomyces boulardii (FLORASTOR) 250 MG capsule Take by mouth.    Marland Kitchen XIIDRA 5 % SOLN   6  . Alemtuzumab (LEMTRADA) 12 MG/1.2ML SOLN Inject 12 mg into the vein.    Marland Kitchen neomycin-polymyxin-hydrocortisone (CORTISPORIN) OTIC solution Apply one to two drops to toe after soaking twice daily. (Patient not taking: Reported on 11/07/2017) 10 mL 0   Current Facility-Administered Medications on File Prior to Visit  Medication Dose Route Frequency Provider Last Rate Last Dose  . gadopentetate dimeglumine (MAGNEVIST) injection 16 mL  16 mL Intravenous Once PRN Despina Arias  A, MD        Review of Systems Patient denies any headaches, blurred vision, shortness of breath, chest pain, abdominal pain, problems with bowel movements, urination, or intercourse.  Objective:  BP 103/63   Pulse 85   Ht 5\' 2"  (1.575 m)   Wt 194 lb 1.6 oz (88 kg)   BMI 35.50 kg/m  Physical Exam  General:  Well developed, well nourished, no acute distress. She is alert and oriented x3. Skin:  Warm and dry Neck:  Midline trachea, no thyromegaly or nodules Cardiovascular: Regular rate and rhythm, no murmur heard Lungs:  Effort normal, all lung fields clear to auscultation bilaterally Breasts:  No dominant palpable mass, retraction, or  nipple discharge Abdomen:  Soft, non tender, no hepatosplenomegaly or masses Pelvic:  External genitalia is normal in appearance.  The vagina is normal in appearance. The cervix is bulbous, no CMT.  Thin prep pap is not done. Uterus is felt to be normal size, shape, and contour.  No adnexal masses or tenderness noted. Extremities:  No swelling or varicosities noted Psych:  She has a normal mood and affect  Assessment:   Healthy well-woman exam Thyroid disorder MS Migraines Obesity    Plan:  Referred to [facility] endocrinologist Encouraged restarting weight watchers. F/U 1 year for AE, or sooner if needed .Marland Kitchen  Ahad Colarusso Suzan Nailer, CNM

## 2017-11-11 MED FILL — METFORMIN HCL ER 750 MG TAB: 750 | 30 days supply | Qty: 30 | Fill #3

## 2017-11-21 ENCOUNTER — Encounter: Payer: Self-pay | Admitting: Neurology

## 2017-12-02 ENCOUNTER — Other Ambulatory Visit: Payer: Self-pay

## 2017-12-02 ENCOUNTER — Telehealth: Payer: Self-pay | Admitting: Neurology

## 2017-12-02 MED ORDER — METHYLPHENIDATE HCL ER (OSM) 36 MG PO TBCR
EXTENDED_RELEASE_TABLET | ORAL | 0 refills | Status: DC
Start: 2017-12-02 — End: 2018-03-12

## 2017-12-02 NOTE — Telephone Encounter (Signed)
Pt had labs drawn last week at her job and wants to know what the TSH levels are. Please call to advise

## 2017-12-02 NOTE — Telephone Encounter (Signed)
Spoke with Carrie Nelson and advised TSH is nml at 0.665/fim

## 2017-12-02 NOTE — Telephone Encounter (Signed)
Rx registry checked. Last fill date is 09/12/17 for #90. Next OV is 12/25/17.

## 2017-12-06 DIAGNOSIS — E890 Postprocedural hypothyroidism: Secondary | ICD-10-CM | POA: Diagnosis not present

## 2017-12-13 DIAGNOSIS — E890 Postprocedural hypothyroidism: Secondary | ICD-10-CM | POA: Diagnosis not present

## 2017-12-16 MED FILL — METFORMIN HCL ER 750 MG TAB: 750 | 30 days supply | Qty: 30 | Fill #4

## 2017-12-25 ENCOUNTER — Encounter: Payer: Self-pay | Admitting: Neurology

## 2017-12-25 ENCOUNTER — Ambulatory Visit: Payer: 59 | Admitting: Neurology

## 2017-12-25 VITALS — BP 101/60 | HR 90 | Ht 62.0 in | Wt 192.5 lb

## 2017-12-25 DIAGNOSIS — R269 Unspecified abnormalities of gait and mobility: Secondary | ICD-10-CM

## 2017-12-25 DIAGNOSIS — G35 Multiple sclerosis: Secondary | ICD-10-CM

## 2017-12-25 DIAGNOSIS — Z79899 Other long term (current) drug therapy: Secondary | ICD-10-CM

## 2017-12-25 DIAGNOSIS — R5383 Other fatigue: Secondary | ICD-10-CM | POA: Diagnosis not present

## 2017-12-25 DIAGNOSIS — E0590 Thyrotoxicosis, unspecified without thyrotoxic crisis or storm: Secondary | ICD-10-CM | POA: Diagnosis not present

## 2017-12-25 MED ORDER — TOPIRAMATE 50 MG PO TABS: 50.0000 mg | ORAL_TABLET | Freq: Every day | ORAL | 11 refills | Status: DC

## 2017-12-25 NOTE — Progress Notes (Signed)
[facility]  PATIENT: Carrie Nelson DOB: Nov 04, 1980  REFERRING CLINICIAN: Daniel Nones HISTORY FROM: Patient   REASON FOR VISIT: Multiple Sclerosis   HISTORICAL  CHIEF COMPLAINT:  Chief Complaint  Patient presents with  . Follow-up    RM 12, alone. Last seen 06/24/17.   . Multiple Sclerosis    On Lemtrada. Doing well.  . Migraine    wants to discuss Inderal she is on for migraines. Does not think it is helping. She is unsure what dose she is on. I called her pharmacy and spoke iwth Marisue Ivan to clarify this. She is taking propranolol 60mg  XR once daily.                                               f                                                                    HISTORY OF PRESENT ILLNESS:  Carrie Nelson is a 37 y.o. woman who was diagnosed with MS in 2007.    Update 12/25/2017: She feels her MS has been stable.  She had her second course of Lemtrada in November 2018.  She tolerated it well with just mild infusion reactions.  She has been compliant with monthly REMS monitoring.  Of note, she did have hypothyroidism that has been treated with radioactive iodine and levothyroxine..   She feels stable neurologically.  Her gait is doing about the same as she has no falls.  Her right leg is mildly clumsy but not weak.  Bladder function is fine.  Vision is fine.  She notes some fatigue.  In the past she was on Concerta with some benefit but discontinued when she was diagnosed with hypothyroidism.  Cognition is doing well.  Mood is doing well.    Her fatigue improved with Concerta.   It also helps her focus better.      She does have migraine headaches.  She is on Inderal LA 60 mg as an agent to help hyperthyroid symptoms. It helped migraine but not now.   It also makes her BP low (though not symptomatic).   She also takes Maxalt 10 mg as needed.  It often helps but she sometimes needs a second one and it makes her sleepy.   Migraines were very rare until recently.    She has migraines 1/week lasting several hours with intense pain but milder pain x 36 hours.       Update 06/24/2017: She had a second course of Lemtrada infusions November 2018.  She tolerated the infusions well with only mild .  Her MS has been stable she has not had any exacerbations.  She is compliant with monthly REMS monitoring.  She was recently diagnosed with hyperthyroidism and had 1 dose of radioactive iodine 5 weeks ago.  She feels gait is doing about the same.  The right leg is mildly clumsy compared to the left leg but she is not having any falls.  She notes no difficulties with her bladder.    Vision is fine.  She continues to have some fatigue  both physical and mental.  Concerta was helping but she stopped when the hyperthyroidism was diagnosed.  .   She has mild difficulties with insomnia since the thyroid issues.   She denies cognitive issues.  She has no problems with mood.  Update 12/25/2016:    She had her first treatment with [facility] November 2017 and will have her second 3 day course next month. She feels her MS has been very stable since starting [place]. Specifically there has not been any exacerbations. She had a few mild infusion reactions the week of the infusions but was fine by the next Monday. She feels she is stable neurologically with no new numbness or weakness. The right leg is mildly clumsier and slightly weaker than the left leg. She notes this mostly when she tries to move fast. She denies any difficulties with her bladder. There is no problems with vision.  She has fatigue, physical and mental.   Concerta helps best when she takes mid morning.    Most days, she just takes one dose in the mid morning.  By taking it later, she has not needed the second dose.  She sleeps well at night.    She denies cognitive issues.     She denies any significant depression or anxiety.   She is on low dose Prozac with benefit   From 08/02/2015  MS:   She had her first week of  Lemtrada infusions November 13 through 01/13/2016. She will get 3 additional days in November 2018. She tolerated the infusions fairly well though she did have some chills and headaches. She also felt weaker the next week.   Fatigue continues to be her major problem.  Gait/strength/sensation:    Gait strength and sensation are generally doing well. She sometimes drags the right leg when she is more tired notices that the right leg is weak when she climbs stairs sometimes there will be mild dysesthesias in that leg but they are never troubling.   Bladder: She denies urinary frequency, urgency or hesitancy..              Vision:   She denies any MS related vision problem. e.     Fatigue/sleep/cognition:     She has more fatigue than last year.   Concerta 36 mg once or twice a day helps.  She usually takes the first dose in the morning and will take a second dose around noon some days. Besides MS fatigue, she notes less sleepiness in the afternoons.   She sleeps well at night.     She notes that her decreased focus is also better.    She denies memory problems.     Mood:   She denies any depression.   She notes mild anxiety helped by Prozac 10 mg   MS History:   She was diagnosed in 2007 but,  in retrospect, had right foot dragging that started around 2005. Then shortly after that she had an episode of left facial numbness. In 2007 she had an episode of slurred speech and worsening clumsiness.  MRI of the brain that was consistent with multiple sclerosis. She also had a lumbar puncture and her CSF compatible with the diagnosis of MS. Most of her symptoms improved over the next few months but she did not get 100% back to baseline and she continues to have minimal residual weakness, clumsiness and numbness. She was initially placed on Betaseron but switched to Tysabri because of some generalized side effects of Betaseron.  She was seeing Dr. Tinnie Gens 2007 until 2014.   She was on Tysabri between 2008 and late  2014 when she stopped because of a desire to get pregnant. She became pregnant in early 2015 and delivered in November 2015. She had not been on any disease modifying therapy since late 2014 and then started Tecfidera 02/2014.Marland Kitchen   She did not tolerate it well and started [facility] 12/2015.     REVIEW OF SYSTEMS:  Constitutional: No fevers, chills, sweats, or change in appetite Eyes: No visual changes, double vision, eye pain Ear, nose and throat: No hearing loss, ear pain, nasal congestion, sore throat Cardiovascular: No chest pain, palpitations Respiratory:  No shortness of breath at rest or with exertion.   No wheezes GastrointestinaI: No nausea, vomiting, diarrhea, abdominal pain, fecal incontinence Genitourinary:  No dysuria, urinary retention or frequency.  No nocturia. Musculoskeletal:  No neck pain, back pain Integumentary: No rash, pruritus, skin lesions Neurological: as above Psychiatric: No depression at this time.  No anxiety.   Occasionally she has irritability. Endocrine: She she is borderline diabetic. No palpitations, diaphoresis, change in appetite, change in weigh or increased thirst Hematologic/Lymphatic:  No anemia, purpura, petechiae. Allergic/Immunologic: She has seasonal allergies and is treated with several medications.  ALLERGIES: No Known Allergies  HOME MEDICATIONS: Outpatient Medications Prior to Visit  Medication Sig Dispense Refill  . Alemtuzumab (LEMTRADA) 12 MG/1.2ML SOLN Inject 12 mg into the vein.    Marland Kitchen ALPRAZolam (XANAX) 0.5 MG tablet Take 0.5 mg by mouth at bedtime as needed for anxiety.    Marland Kitchen aspirin EC 81 MG tablet Take by mouth.    . cholecalciferol (VITAMIN D) 1000 UNITS tablet Take 2,000 Units by mouth daily.     Marland Kitchen FLUoxetine (PROZAC) 10 MG capsule TAKE 1 CAPSULE BY MOUTH ONCE DAILY 30 capsule 4  . LARIN 1.5/30 1.5-30 MG-MCG tablet TAKE 1 TABLET BY MOUTH DAILY 63 tablet 5  . Levothyroxine Sodium 175 MCG CAPS Take by mouth.    . metFORMIN  (GLUCOPHAGE-XR) 750 MG 24 hr tablet TAKE 1 TABLET BY MOUTH DAILY WITH BREAKFAST. 30 tablet 5  . methylphenidate (CONCERTA) 36 MG PO CR tablet TAKE 1 TO 2 TABLETS BY MOUTH DAILY AS NEEDED. 90 tablet 0  . montelukast (SINGULAIR) 10 MG tablet   3  . Multiple Vitamins-Minerals (MULTIVITAMIN PO) Take by mouth.    . rizatriptan (MAXALT-MLT) 10 MG disintegrating tablet Take 1 tablet (10 mg total) by mouth as needed for migraine. May repeat in 2 hours if needed 27 tablet 3  . saccharomyces boulardii (FLORASTOR) 250 MG capsule Take by mouth.    Marland Kitchen XIIDRA 5 % SOLN   6  . neomycin-polymyxin-hydrocortisone (CORTISPORIN) OTIC solution Apply one to two drops to toe after soaking twice daily. 10 mL 0  . propranolol ER (INDERAL LA) 60 MG 24 hr capsule Take 60 mg by mouth daily.    . Cetirizine HCl (ZYRTEC ALLERGY) 10 MG CAPS Take 10 mg by mouth daily.    . propranolol (INDERAL) 20 MG tablet Take 20 mg by mouth 3 (three) times daily.     Facility-Administered Medications Prior to Visit  Medication Dose Route Frequency Provider Last Rate Last Dose  . gadopentetate dimeglumine (MAGNEVIST) injection 16 mL  16 mL Intravenous Once PRN Yadir Zentner, Pearletha Furl, MD        PAST MEDICAL HISTORY: Past Medical History:  Diagnosis Date  . Multiple sclerosis (HCC)   . Syncope   . Thyroid disease   . Tremor  PAST SURGICAL HISTORY: Past Surgical History:  Procedure Laterality Date  . BUNIONECTOMY    . FOOT SURGERY    . KNEE ARTHROSCOPY W/ ACL RECONSTRUCTION Left 2010    FAMILY HISTORY: Family History  Problem Relation Age of Onset  . Prostate cancer Father   . Diabetes Father   . Arthritis Unknown   . Dementia Unknown   . Hypertension Unknown   . Heart disease Unknown     SOCIAL HISTORY:  Social History   Socioeconomic History  . Marital status: Married    Spouse name: Not on file  . Number of children: o  . Years of education: Not on file  . Highest education level: Not on file  Occupational  History  . Not on file  Social Needs  . Financial resource strain: Not on file  . Food insecurity:    Worry: Not on file    Inability: Not on file  . Transportation needs:    Medical: Not on file    Non-medical: Not on file  Tobacco Use  . Smoking status: Never Smoker  . Smokeless tobacco: Never Used  Substance and Sexual Activity  . Alcohol use: Yes    Comment: minimal weekly   . Drug use: No  . Sexual activity: Yes    Birth control/protection: Pill  Lifestyle  . Physical activity:    Days per week: Not on file    Minutes per session: Not on file  . Stress: Not on file  Relationships  . Social connections:    Talks on phone: Not on file    Gets together: Not on file    Attends religious service: Not on file    Active member of club or organization: Not on file    Attends meetings of clubs or organizations: Not on file    Relationship status: Not on file  . Intimate partner violence:    Fear of current or ex partner: Not on file    Emotionally abused: Not on file    Physically abused: Not on file    Forced sexual activity: Not on file  Other Topics Concern  . Not on file  Social History Narrative   Patient lives with parents.   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    Patient has a Probation officer.      PHYSICAL EXAM  Vitals:   12/25/17 1253  BP: 101/60  Pulse: 90  Weight: 192 lb 8 oz (87.3 kg)  Height: 5\' 2"  (1.575 m)    Body mass index is 35.21 kg/m.   General: The patient is well-developed and well-nourished and in no acute distress   Neurologic Exam  Mental status: The patient is alert and oriented x 3 at the time of the examination. The patient has apparent normal recent and remote memory, with an apparently normal attention span and concentration ability.   Speech is normal.  Cranial nerves:   Extraocular muscles are intact.  Facial strength and sensation is normal.  Trapezius  strength is normal.  Hearing is normal symmetric  Motor:  Muscle bulk and tone are normal. Strength is  5 / 5 in all 4 extremities.   Sensory: She has intact sensation to touch and vibration.  Coordination: She has good finger-nose-finger and heel-to-shin bilaterally.  Gait and station: Station and gait are normal.  Her tandem gait is mildly wide.  Romberg is negative. Reflexes: Deep tendon reflexes are symmetric and  normal in arms. The right knee reflex is mildly increased compared to the left.  Symmetric ankle reflexes    DIAGNOSTIC DATA (LABS, IMAGING, TESTING) - I reviewed patient records, labs, notes, testing and imaging myself where available.  Lab Results  Component Value Date   WBC 4.1 10/18/2015   HGB 13.1 10/18/2015   HCT 39.7 10/18/2015   MCV 93 10/18/2015   PLT 377 10/18/2015      Component Value Date/Time   NA 136 08/02/2017 1650   NA 141 10/18/2015 1615   NA 138 05/09/2012 0830   K 3.7 08/02/2017 1650   K 3.7 05/09/2012 0830   CL 103 08/02/2017 1650   CL 108 (H) 05/09/2012 0830   CO2 24 08/02/2017 1650   CO2 23 05/09/2012 0830   GLUCOSE 98 08/02/2017 1650   GLUCOSE 84 05/09/2012 0830   BUN 16 08/02/2017 1650   BUN 13 10/18/2015 1615   BUN 16 05/09/2012 0830   CREATININE 1.02 (H) 08/02/2017 1650   CREATININE 0.95 05/09/2012 0830   CALCIUM 9.5 08/02/2017 1650   CALCIUM 8.6 05/09/2012 0830   PROT 6.7 10/18/2015 1615   PROT 6.7 05/09/2012 0830   ALBUMIN 4.2 10/18/2015 1615   ALBUMIN 3.4 05/09/2012 0830   AST 12 10/18/2015 1615   AST 17 05/09/2012 0830   ALT 9 10/18/2015 1615   ALT 16 05/09/2012 0830   ALKPHOS 43 10/18/2015 1615   ALKPHOS 55 05/09/2012 0830   BILITOT 0.3 10/18/2015 1615   BILITOT 0.7 05/09/2012 0830   GFRNONAA >60 08/02/2017 1650   GFRNONAA >60 05/09/2012 0830   GFRAA >60 08/02/2017 1650   GFRAA >60 05/09/2012 0830        ASSESSMENT AND PLAN  Multiple sclerosis (HCC) - Plan: MR BRAIN W WO CONTRAST, MR CERVICAL SPINE W WO  CONTRAST  Gait disturbance  Other fatigue  Hyperthyroidism  High risk medication use   1.   Her MS appears to be stable after the 2 courses of Lemtrada.  She will continue with monthly REMS testing.  We need to check an MRI of the brain and cervical spine to make sure that she is not having subclinical progression.  If present, we will need to reinitiate a disease modifying therapy. 2.  She is advised to remain active and exercises as tolerated. 3.   She will follow-up with endocrinology for her hypothyroidism.   4.   Topiramate 50 mg nightly for migraine and Maxalt prn.  She will continue Concerta 36 mg prn.  5.   Return in 6 months or call sooner if new or worsening neurologic symptoms.    Zonia Caplin A. Epimenio Foot, MD, PhD 12/25/2017, 7:09 PM Certified in Neurology, Clinical Neurophysiology, Sleep Medicine, Pain Medicine and Neuroimaging  [facility] [address], [address] [place], [place] [address] [phone]-

## 2017-12-26 ENCOUNTER — Telehealth: Payer: Self-pay | Admitting: Neurology

## 2017-12-26 NOTE — Telephone Encounter (Signed)
lvm for pt to call back about scheduling mri  [facility]: NPR spoke to Madelaine Bhat Ref # [id number]

## 2017-12-27 ENCOUNTER — Encounter: Payer: Self-pay | Admitting: Neurology

## 2017-12-27 NOTE — Telephone Encounter (Signed)
Patient returned my call she wanted her MRI's after the new year she is scheduled for 03/05/18

## 2018-01-13 MED FILL — METFORMIN HCL ER 750 MG TAB: 750 | 30 days supply | Qty: 30 | Fill #5

## 2018-02-07 ENCOUNTER — Encounter: Payer: Self-pay | Admitting: Physician Assistant

## 2018-02-07 ENCOUNTER — Ambulatory Visit (INDEPENDENT_AMBULATORY_CARE_PROVIDER_SITE_OTHER): Payer: Self-pay | Admitting: Physician Assistant

## 2018-02-07 VITALS — BP 128/80 | HR 93 | Temp 98.2°F | Wt 180.0 lb

## 2018-02-07 DIAGNOSIS — R05 Cough: Secondary | ICD-10-CM

## 2018-02-07 DIAGNOSIS — R059 Cough, unspecified: Secondary | ICD-10-CM

## 2018-02-07 DIAGNOSIS — J069 Acute upper respiratory infection, unspecified: Secondary | ICD-10-CM

## 2018-02-07 MED ORDER — PROMETHAZINE-DM 6.25-15 MG/5ML PO SYRP: 5.0000 mL | ORAL_SOLUTION | Freq: Four times a day (QID) | ORAL | 0 refills | Status: DC | PRN

## 2018-02-07 MED ORDER — PREDNISONE 20 MG PO TABS: 40.0000 mg | ORAL_TABLET | Freq: Every day | ORAL | 0 refills | Status: AC

## 2018-02-07 NOTE — Patient Instructions (Signed)
Thank you for choosing [facility] for your health care needs.  You have been diagnosed with an upper respiratory infection (a cold).  Recommend increase fluids; water, Gatorade, or hot tea with lemon/honey. Rest. Use humidifier in bedroom. Prop self up with several pillows at night, will help with coughing.  You have been prescribed a short course of prednisone: 40mg  (take two 20mg  pills together, like one dose) once a day x 5 days. You have also been prescribed a prescription cough syrup: Phenergan-DM.  Follow-up with family physician, urgent care, or at [facility] in 4-5 days if not improving. Sooner with any worsening symptoms.  Upper Respiratory Infection, Adult Most upper respiratory infections (URIs) are caused by a virus. A URI affects the nose, throat, and upper air passages. The most common type of URI is often called "the common cold." Follow these instructions at home:  Take medicines only as told by your doctor.  Gargle warm saltwater or take cough drops to comfort your throat as told by your doctor.  Use a warm mist humidifier or inhale steam from a shower to increase air moisture. This may make it easier to breathe.  Drink enough fluid to keep your pee (urine) clear or pale yellow.  Eat soups and other clear broths.  Have a healthy diet.  Rest as needed.  Go back to work when your fever is gone or your doctor says it is okay. ? You may need to stay home longer to avoid giving your URI to others. ? You can also wear a face mask and wash your hands often to prevent spread of the virus.  Use your inhaler more if you have asthma.  Do not use any tobacco products, including cigarettes, chewing tobacco, or electronic cigarettes. If you need help quitting, ask your doctor. Contact a doctor if:  You are getting worse, not better.  Your symptoms are not helped by medicine.  You have chills.  You are getting more short of breath.  You have brown or red mucus.  You  have yellow or brown discharge from your nose.  You have pain in your face, especially when you bend forward.  You have a fever.  You have puffy (swollen) neck glands.  You have pain while swallowing.  You have white areas in the back of your throat. Get help right away if:  You have very bad or constant: ? Headache. ? Ear pain. ? Pain in your forehead, behind your eyes, and over your cheekbones (sinus pain). ? Chest pain.  You have long-lasting (chronic) lung disease and any of the following: ? Wheezing. ? Long-lasting cough. ? Coughing up blood. ? A change in your usual mucus.  You have a stiff neck.  You have changes in your: ? Vision. ? Hearing. ? Thinking. ? Mood. This information is not intended to replace advice given to you by your health care provider. Make sure you discuss any questions you have with your health care provider. Document Released: 08/01/2007 Document Revised: 10/16/2015 Document Reviewed: 05/20/2013 Elsevier Interactive Patient Education  2018 [employer].

## 2018-02-07 NOTE — Progress Notes (Signed)
Patient ID: Carrie Nelson DOB: 12-30-1980 AGE: 37 y.o. MRN: [id number]   PCP: Lynnea Ferrier, MD   Chief Complaint:  Chief Complaint  Patient presents with  . nasal congestion    x 6d  . Hoarse    x 6d  . chest congestion    x 6d     Subjective:    HPI:  Carrie Nelson is a 37 y.o. female presents for evaluation  Chief Complaint  Patient presents with  . nasal congestion    x 6d  . Hoarse    x 6d  . chest congestion    x 77d    37 year old female presents to [facility] with 6 day history of URI symptoms. Began with sore/irritated throat. Lasted for 3 days. Then developed nasal congestion, bilateral maxillary sinus pressure/congestion, and ear drainage sensation. Sinus pain resolved; nasal congestion lingered. Has since developed cough. Coarse/junky. Persistent throughout the day; triggered by talking and deep inspiration. Cough worse at night. Yesterday caused difficulty sleeping. Denies fever, chills, headache, ear pain, chest pain, SOB, wheezing. No smoking history. Denies asthma history or previous prescription of albuterol inhaler. No previous history of pneumonia. 66 year old son currently sick with similar URI symptoms; diagnosed with croup 3 weeks ago, diagnosed with otitis media 1 week ago, completing course of Amoxicillin.  Patient with stable MS. Diagnosed in 2007. Managed by Dr. Despina Arias with [facility]. Last seen 12/25/2017. Patient currentonly on monthly REMS monitoring. Underwent Lemtrada treatment; completed second course November 2018. Has not been on any disease modifying treatment since. Patient CBC from 12/27/2017 WNL. Patient states her CD4 count just stabilized last month.  Patient with DM2. Last A1C 5.1.  A limited review of symptoms was performed, pertinent positives and negatives as mentioned in HPI.  The following portions of the patient's history were reviewed and updated as appropriate: allergies,  current medications and past medical history.  Patient Active Problem List   Diagnosis Date Noted  . Seasonal allergies 09/19/2017  . Hyperthyroidism 06/24/2017  . Chronic venous insufficiency 05/10/2016  . 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 Pain in limb 04/02/2016  . Type 2 diabetes mellitus (HCC) 04/02/2016  . High risk medication use 10/18/2015  . Constipation, chronic 08/18/2015  . Asthma, exogenous 01/31/2015  . Anxiety 01/31/2015  . History of knee problem 01/31/2015  . H/O thyroid disease 01/31/2015  . Allergic rhinitis, seasonal 01/31/2015  . Gait disturbance 07/08/2014  . Other fatigue 07/08/2014  . Attention deficit disorder 07/08/2014  . Nonspecific abnormal results of other specified function study 09/06/2012  . Dysarthria 09/06/2012  . Knee Pain, Left  09/06/2012  . Multiple sclerosis (HCC) 09/06/2012  . Female genuine stress incontinence 11/06/2011  . FOM (frequency of micturition) 11/06/2011  . Chronic cystitis 11/06/2011  . Gross hematuria 11/06/2011    No Known Allergies  Current Outpatient Medications on File Prior to Visit  Medication Sig Dispense Refill  . Alemtuzumab (LEMTRADA) 12 MG/1.2ML SOLN Inject 12 mg into the vein.    Marland Kitchen ALPRAZolam (XANAX) 0.5 MG tablet Take 0.5 mg by mouth at bedtime as needed for anxiety.    Marland Kitchen aspirin EC 81 MG tablet Take by mouth.    . cholecalciferol (VITAMIN D) 1000 UNITS tablet Take 2,000 Units by mouth daily.     Marland Kitchen FLUoxetine (PROZAC) 10 MG capsule TAKE 1 CAPSULE BY MOUTH ONCE DAILY 30 capsule 4  . Fluticasone-Salmeterol (ADVAIR) 250-50 MCG/DOSE AEPB  Frequency:PHARMDIR   Dosage:0.0     Instructions:  Note:0 Dose: 250-50 MCG    . LARIN 1.5/30 1.5-30 MG-MCG tablet TAKE 1 TABLET BY MOUTH DAILY 63 tablet 5  . Levothyroxine Sodium 175 MCG CAPS Take by mouth.    . metFORMIN (GLUCOPHAGE-XR) 750 MG 24 hr tablet TAKE 1 TABLET BY MOUTH DAILY WITH BREAKFAST. 30 tablet 5  . methylphenidate  (CONCERTA) 36 MG PO CR tablet TAKE 1 TO 2 TABLETS BY MOUTH DAILY AS NEEDED. 90 tablet 0  . montelukast (SINGULAIR) 10 MG tablet   3  . Multiple Vitamins-Minerals (MULTIVITAMIN PO) Take by mouth.    . saccharomyces boulardii (FLORASTOR) 250 MG capsule Take by mouth.    . topiramate (TOPAMAX) 50 MG tablet Take 1 tablet (50 mg total) by mouth at bedtime. 30 tablet 11  . XIIDRA 5 % SOLN   6  . rizatriptan (MAXALT-MLT) 10 MG disintegrating tablet Take 1 tablet (10 mg total) by mouth as needed for migraine. May repeat in 2 hours if needed 27 tablet 3   No current facility-administered medications on file prior to visit.        Objective:   Vitals:   02/07/18 1133  BP: 128/80  Pulse: 93  Temp: 98.2 F (36.8 C)  SpO2: 97%     Wt Readings from Last 3 Encounters:  02/07/18 180 lb (81.6 kg)  12/25/17 192 lb 8 oz (87.3 kg)  11/07/17 194 lb 1.6 oz (88 kg)    Physical Exam:   General Appearance:  Patient sitting comfortably on examination table. In no acute distress. Does not appear acutely ill. Afebrile.  Head:  Normocephalic, without obvious abnormality, atraumatic  Eyes:  PERRL, conjunctiva/corneas clear, EOM's intact  Ears:  Normal TM's and external ear canals, both ears  Nose: Nares normal, septum midline. Nasal mucosa reveals bilateral edema. Clear rhinorrhea. Nasally sounding voice. No tenderness with palpation over bilateral maxillary sinuses.  Throat: Lips, mucosa, and tongue normal; teeth and gums normal. Throat reveals no erythema. Tonsils with no enlargement or exudate.  Neck: Supple, symmetrical, trachea midline, no lymphadenopathy  Lungs:   Clear to auscultation bilaterally, respirations unlabored. Good aeration. No wheezing, rales, rhonchi, or crackles. Deep inspiration triggers minor/coarse cough.  Heart:  Regular rate and rhythm, S1 and S2 normal, no murmur, rub, or gallop  Extremities: Extremities normal, atraumatic, no cyanosis or edema  Pulses: 2+ and symmetric  Skin:  Skin color, texture, turgor normal, no rashes or lesions  Lymph nodes: Cervical, supraclavicular, and axillary nodes normal  Neurologic: Normal    Assessment & Plan:    Exam findings, diagnosis etiology and medication use and indications reviewed with patient. Follow-Up and discharge instructions provided. No emergent/urgent issues found on exam.  Patient education was provided.   Patient verbalized understanding of information provided and agrees with plan of care (POC), all questions answered. The patient is advised to call or return to clinic if condition does not see an improvement in symptoms, or to seek the care of the closest emergency department if condition worsens with the below plan.    1. Upper respiratory tract infection, unspecified type  2. Cough  - predniSONE (DELTASONE) 20 MG tablet; Take 2 tablets (40 mg total) by mouth daily with breakfast for 5 days.  Dispense: 10 tablet; Refill: 0 - promethazine-dextromethorphan (PROMETHAZINE-DM) 6.25-15 MG/5ML syrup; Take 5 mLs by mouth 4 (four) times daily as needed for cough.  Dispense: 118 mL; Refill: 0  Patient with 6 day history of URI  symptoms. History and PE consistent with self-limited viral URI. VSS. Patient in no acute distress. PE benign; including lung sounds. Patient with mild immunocompromised state due to MS diagnosis; however, stable at this time, and not currently on any DMARDs. Monitored closely by neurology. Will treat patient symptomatically; prescribed 5-day course of 40mg  prednisone qd and Phenergan-DM cough syrup. Advised patient f/u with PCP, urgent care, or [facility] in 4-5 days if not improving, sooner with worsening symptoms.    Carrie Nelson, MHS, PA-C Carrie Nelson, MHS, PA-C Advanced Practice Provider [facility]  [address] [facility], 1st Floor [place], [place] [address] (p):  [phone] [email or web address]

## 2018-02-10 ENCOUNTER — Telehealth: Payer: Self-pay | Admitting: Emergency Medicine

## 2018-02-10 NOTE — Telephone Encounter (Signed)
Spoke to patient whom informed me that she is doing well. This was a follow up call from [facility] visit

## 2018-02-11 ENCOUNTER — Encounter: Payer: Self-pay | Admitting: Neurology

## 2018-02-12 ENCOUNTER — Telehealth: Payer: Self-pay | Admitting: *Deleted

## 2018-02-12 NOTE — Telephone Encounter (Signed)
My Chart message sent to let pt. know recent Lemtrada labs, drawn on 02/11/18 show TSH is 0.049/fim

## 2018-02-13 ENCOUNTER — Encounter: Payer: Self-pay | Admitting: Physician Assistant

## 2018-02-13 ENCOUNTER — Ambulatory Visit (INDEPENDENT_AMBULATORY_CARE_PROVIDER_SITE_OTHER): Payer: Self-pay | Admitting: Physician Assistant

## 2018-02-13 VITALS — BP 140/94 | HR 89 | Temp 98.0°F | Resp 12 | Wt 180.0 lb

## 2018-02-13 DIAGNOSIS — J40 Bronchitis, not specified as acute or chronic: Secondary | ICD-10-CM

## 2018-02-13 MED ORDER — ALBUTEROL SULFATE HFA 108 (90 BASE) MCG/ACT IN AERS: 2.0000 | INHALATION_SPRAY | RESPIRATORY_TRACT | 0 refills | Status: DC | PRN

## 2018-02-13 MED ORDER — FLUTICASONE PROPIONATE (INHAL) 50 MCG/BLIST IN AEPB: 1.0000 | INHALATION_SPRAY | Freq: Two times a day (BID) | RESPIRATORY_TRACT | 0 refills | Status: DC

## 2018-02-13 MED ORDER — DOXYCYCLINE HYCLATE 100 MG PO TABS: 100.0000 mg | ORAL_TABLET | Freq: Two times a day (BID) | ORAL | 0 refills | Status: AC

## 2018-02-13 NOTE — Patient Instructions (Signed)
Thank you for choosing [facility] for your health care needs.  You have been diagnosed with bronchitis (a chest cold).  Recommend increase fluids; water, Gatorade, tea with lemon/honey, or orange juice. Rest. Use cool mist humidifier in bedroom. Prop self up with several pillows while lying in bed or on the couch.  May continue to use over the counter Delsym or Robitussin for cough.  Use albuterol inhaler: 2 puffs every 4-6 hours (when awake) for cough, chest tightness, wheezing, shortness of breath. Use Flovent inhaler: 1 puff twice a day x 2 weeks. Regularly.  If you are not feeling better in a few days, may start the antibiotic, Doxycycline. Take 1 tablet twice a day x 7 days. If you start the antibiotic, take the whole course.  If you do not feel better two days after starting the antibiotic, recommend you follow-up with your family physician, at an urgent care, or at the ED for further evaluation and treatment.  Hope you feel better soon!  Acute Bronchitis, Adult Acute bronchitis is when air tubes (bronchi) in the lungs suddenly get swollen. The condition can make it hard to breathe. It can also cause these symptoms:  A cough.  Coughing up clear, yellow, or green mucus.  Wheezing.  Chest congestion.  Shortness of breath.  A fever.  Body aches.  Chills.  A sore throat. Follow these instructions at home:  Medicines  Take over-the-counter and prescription medicines only as told by your doctor.  If you were prescribed an antibiotic medicine, take it as told by your doctor. Do not stop taking the antibiotic even if you start to feel better. General instructions  Rest.  Drink enough fluids to keep your pee (urine) pale yellow.  Avoid smoking and secondhand smoke. If you smoke and you need help quitting, ask your doctor. Quitting will help your lungs heal faster.  Use an inhaler, cool mist vaporizer, or humidifier as told by your doctor.  Keep all follow-up  visits as told by your doctor. This is important. How is this prevented? To lower your risk of getting this condition again:  Wash your hands often with soap and water. If you cannot use soap and water, use hand sanitizer.  Avoid contact with people who have cold symptoms.  Try not to touch your hands to your mouth, nose, or eyes.  Make sure to get the flu shot every year. Contact a doctor if:  Your symptoms do not get better in 2 weeks. Get help right away if:  You cough up blood.  You have chest pain.  You have very bad shortness of breath.  You become dehydrated.  You faint (pass out) or keep feeling like you are going to pass out.  You keep throwing up (vomiting).  You have a very bad headache.  Your fever or chills gets worse. This information is not intended to replace advice given to you by your health care provider. Make sure you discuss any questions you have with your health care provider. Document Released: 08/01/2007 Document Revised: 09/26/2016 Document Reviewed: 08/03/2015 Elsevier Interactive Patient Education  2019 [employer]

## 2018-02-13 NOTE — Progress Notes (Signed)
Patient ID: Carrie IonBreten C Featherly DOB: 01/16/1981 AGE: 37 y.o. MRN: [id number]   PCP: Lynnea FerrierKlein, Bert J III, MD   Chief Complaint:  Chief Complaint  Patient presents with  . follow up on bronchitis  . productive cough with thick green mucous     Subjective:    HPI:  Carrie Nelson is a 37 y.o. female presents for evaluation  Chief Complaint  Patient presents with  . follow up on bronchitis  . productive cough with thick green mucous    37 year old female presents to [facility] with continuation of URI symptoms.  [facility]/07/2018 with 6 day history of URI symptoms. Reports sore throat, then nasal congestion, sinus pressure/congestion, and cough. VSS. Patient in no acute distress. Diagnosed patient with viral URI. Prescribed prednisone 40mg  qd x 5 days and Promethazine-DM cough syrup for symptom relief.  Patient states for first 3 days on steroid, she was feeling better. However, then developed worsening cough. Cough tight. Barking. Associated chest tightness. Associated shortness of breath with physical exertion, such as walking for extended period of time. States she is coughing up thick green sputum, was clear. States she feels ok; denies malaise. Denies fever, chills, headache, ear pain, sinus pain, sore throat, chest pain, wheezing. Patient continues to have nasal congestion; however, states better than at last visit. Has been using OTC Mucinex-DM with no improvement. Patient concerned about being sick over the holidays, next week.  Patient with stable MS. Diagnosed in 2007. Managed [facility] Despina Ariasichard Sater [facility]. Last seen 12/25/2017. Patient currentonly on monthly REMS monitoring. Underwent Lemtrada treatment; completed second course November 2018. Has not been on any disease modifying treatment since. Patient CBC from 12/27/2017 WNL. Patient states her CD4 count just stabilized last month.  Patient with DM2.  Last A1C 5.1.  A limited review of symptoms was performed, pertinent positives and negatives as mentioned in HPI.  The following portions of the patient's history were reviewed and updated as appropriate: allergies, current medications and past medical history.  Patient Active Problem List   Diagnosis Date Noted  . Seasonal allergies 09/19/2017  . Hyperthyroidism 06/24/2017  . Chronic venous insufficiency 05/10/2016  . 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 Pain in limb 04/02/2016  . Type 2 diabetes mellitus (HCC) 04/02/2016  . High risk medication use 10/18/2015  . Constipation, chronic 08/18/2015  . Asthma, exogenous 01/31/2015  . Anxiety 01/31/2015  . History of knee problem 01/31/2015  . H/O thyroid disease 01/31/2015  . Allergic rhinitis, seasonal 01/31/2015  . Gait disturbance 07/08/2014  . Other fatigue 07/08/2014  . Attention deficit disorder 07/08/2014  . Nonspecific abnormal results of other specified function study 09/06/2012  . Dysarthria 09/06/2012  . Knee Pain, Left  09/06/2012  . Multiple sclerosis (HCC) 09/06/2012  . Female genuine stress incontinence 11/06/2011  . FOM (frequency of micturition) 11/06/2011  . Chronic cystitis 11/06/2011  . Gross hematuria 11/06/2011    No Known Allergies  Current Outpatient Medications on File Prior to Visit  Medication Sig Dispense Refill  . Alemtuzumab (LEMTRADA) 12 MG/1.2ML SOLN Inject 12 mg into the vein.    Marland Kitchen. ALPRAZolam (XANAX) 0.5 MG tablet Take 0.5 mg by mouth at bedtime as needed for anxiety.    Marland Kitchen. aspirin EC 81 MG tablet Take by mouth.    . cholecalciferol (VITAMIN D) 1000 UNITS tablet Take 2,000 Units by mouth daily.     Marland Kitchen. FLUoxetine (PROZAC)  10 MG capsule TAKE 1 CAPSULE BY MOUTH ONCE DAILY 30 capsule 4  . Fluticasone-Salmeterol (ADVAIR) 250-50 MCG/DOSE AEPB Frequency:PHARMDIR   Dosage:0.0     Instructions:  Note:0 Dose: 250-50 MCG    . LARIN 1.5/30 1.5-30 MG-MCG tablet TAKE 1  TABLET BY MOUTH DAILY 63 tablet 5  . Levothyroxine Sodium 175 MCG CAPS Take by mouth.    . metFORMIN (GLUCOPHAGE-XR) 750 MG 24 hr tablet TAKE 1 TABLET BY MOUTH DAILY WITH BREAKFAST. 30 tablet 5  . methylphenidate (CONCERTA) 36 MG PO CR tablet TAKE 1 TO 2 TABLETS BY MOUTH DAILY AS NEEDED. 90 tablet 0  . montelukast (SINGULAIR) 10 MG tablet   3  . Multiple Vitamins-Minerals (MULTIVITAMIN PO) Take by mouth.    . promethazine-dextromethorphan (PROMETHAZINE-DM) 6.25-15 MG/5ML syrup Take 5 mLs by mouth 4 (four) times daily as needed for cough. 118 mL 0  . rizatriptan (MAXALT-MLT) 10 MG disintegrating tablet Take 1 tablet (10 mg total) by mouth as needed for migraine. May repeat in 2 hours if needed 27 tablet 3  . saccharomyces boulardii (FLORASTOR) 250 MG capsule Take by mouth.    . topiramate (TOPAMAX) 50 MG tablet Take 1 tablet (50 mg total) by mouth at bedtime. 30 tablet 11  . XIIDRA 5 % SOLN   6   No current facility-administered medications on file prior to visit.        Objective:   Vitals:   02/13/18 1327  BP: (!) 140/94  Pulse: 89  Resp: 12  Temp: 98 F (36.7 C)  SpO2: 99%     Wt Readings from Last 3 Encounters:  02/13/18 180 lb (81.6 kg)  02/07/18 180 lb (81.6 kg)  12/25/17 192 lb 8 oz (87.3 kg)    Physical Exam:   General Appearance:  Patient sitting comfortably on examination table. In no acute distress. Afebrile. Does not appear ill.  Head:  Normocephalic, without obvious abnormality, atraumatic  Eyes:  PERRL, conjunctiva/corneas clear, EOM's intact  Ears:  Bilateral ear canals WNL. No erythema or edema. No discharge/drainage. Bilateral TMs WNL. No erythema, injection, or serous effusion. No scar tissue.  Nose: Nares normal, septum midline. Nasal mucosa with minimal edema. Scant clear rhinorrhea. No sinus tenderness with percussion/palpation.  Throat: Lips, mucosa, and tongue normal; teeth and gums normal. Throat reveals no erythema. Tonsils with no enlargement or  exudate.  Neck: Supple, symmetrical, trachea midline, minimal bilateral anterior cervical lymphadenopathy.  Lungs:   Clear to auscultation bilaterally, respirations unlabored. Good aeration. No wheezing, rales, rhonchi or crackles. Deep inspiration triggers harsh, dry, barking cough.  Heart:  Regular rate and rhythm, S1 and S2 normal, no murmur, rub, or gallop  Extremities: Extremities normal, atraumatic, no cyanosis or edema  Pulses: 2+ and symmetric  Skin: Skin color, texture, turgor normal, no rashes or lesions  Lymph nodes: Cervical, supraclavicular, and axillary nodes normal  Neurologic: Normal    Assessment & Plan:    Exam findings, diagnosis etiology and medication use and indications reviewed with patient. Follow-Up and discharge instructions provided. No emergent/urgent issues found on exam.  Patient education was provided.   Patient verbalized understanding of information provided and agrees with plan of care (POC), all questions answered. The patient is advised to call or return to clinic if condition does not see an improvement in symptoms, or to seek the care of the closest emergency department if condition worsens with the below plan.    1. Bronchitis  - fluticasone (FLOVENT DISKUS) 50 MCG/BLIST diskus inhaler; Inhale  1 puff into the lungs 2 (two) times daily for 14 days.  Dispense: 1 Inhaler; Refill: 0 - albuterol (PROVENTIL HFA;VENTOLIN HFA) 108 (90 Base) MCG/ACT inhaler; Inhale 2 puffs into the lungs every 4 (four) hours as needed for wheezing or shortness of breath.  Dispense: 1 Inhaler; Refill: 0 - doxycycline (VIBRA-TABS) 100 MG tablet; Take 1 tablet (100 mg total) by mouth 2 (two) times daily for 7 days.  Dispense: 14 tablet; Refill: 0  Patient with 12 day history of URI symptoms. Began with sore throat, then developed nasal congestion and sinus pressure, then developed cough. Seen on day 6 of symptoms. Diagnosed with viral URI. Prescribed 5-day 40mg  prednisone taper.  Patient was feeling better; then felt cough worsened. Harsh/barking (patient reports productive). Vital signs stable. No fever. Patient does not feel ill. Will treat patient for bronchitis. Prescribed steroid inhaler (Flovent) and albuterol inhaler. Gave patient wait&hold antibiotic, Doxycycline (100mg  bid x 7 days). Instructed patient to start antibiotic in a few days if cough still not improving, patient with minor immunocompromise due to MS s/p infusion treatment, also due to duration of symptoms (in two days, will have been 2 weeks). Patient agrees with plan. If still no improvement two days after starting antibiotic, advised patient follow up with PCP, urgent care, or ED.    Janalyn Harder, MHS, PA-C Rulon Sera, MHS, PA-C Advanced Practice Provider [facility]  [address] [facility], 1st [address] [place], [place] [address] (p):  [phone] [email or web address]

## 2018-02-17 ENCOUNTER — Telehealth: Payer: Self-pay | Admitting: Emergency Medicine

## 2018-02-17 NOTE — Telephone Encounter (Signed)
Left message following up on visit with [facility] 

## 2018-02-24 ENCOUNTER — Other Ambulatory Visit: Payer: Self-pay | Admitting: Obstetrics and Gynecology

## 2018-02-27 NOTE — Telephone Encounter (Signed)
Updated [facility] [employer] benefits auth: [employer] Ref # [id number]

## 2018-03-03 ENCOUNTER — Other Ambulatory Visit: Payer: Self-pay | Admitting: Obstetrics and Gynecology

## 2018-03-03 NOTE — Telephone Encounter (Signed)
Spoke to the patient she wanted to cancel her MRI appt for 03/05/18 and will call back to r/s.

## 2018-03-05 ENCOUNTER — Other Ambulatory Visit: Payer: 59

## 2018-03-06 DIAGNOSIS — E890 Postprocedural hypothyroidism: Secondary | ICD-10-CM | POA: Diagnosis not present

## 2018-03-12 ENCOUNTER — Other Ambulatory Visit: Payer: Self-pay | Admitting: Neurology

## 2018-03-12 MED ORDER — METHYLPHENIDATE HCL ER (OSM) 36 MG PO TBCR: EXTENDED_RELEASE_TABLET | ORAL | 0 refills | Status: DC

## 2018-03-12 NOTE — Telephone Encounter (Signed)
Pt is calling in for a refill on her methylphenidate (CONCERTA) 36 MG PO CR tablet sent to [facility]

## 2018-03-14 DIAGNOSIS — E890 Postprocedural hypothyroidism: Secondary | ICD-10-CM | POA: Diagnosis not present

## 2018-04-02 ENCOUNTER — Telehealth: Payer: Self-pay | Admitting: *Deleted

## 2018-04-02 NOTE — Telephone Encounter (Signed)
Spoke with Carrie Nelson; it looks like she last had Lemtrada labs drawn 02/11/18. She reports having to r/s labs last week due to having a sick child.  She will call [facility] tech to r/s labs/fim

## 2018-04-15 ENCOUNTER — Encounter: Payer: Self-pay | Admitting: Neurology

## 2018-04-29 DIAGNOSIS — E890 Postprocedural hypothyroidism: Secondary | ICD-10-CM | POA: Diagnosis not present

## 2018-06-02 ENCOUNTER — Other Ambulatory Visit: Payer: Self-pay | Admitting: Neurology

## 2018-06-02 MED FILL — ALPRAZolam 0.5 MG TABS: 0.5 | 20 days supply | Qty: 20 | Fill #0

## 2018-06-02 MED FILL — CONCERTA 36 MG TABLET ER: 36 | 45 days supply | Qty: 90 | Fill #0

## 2018-06-12 ENCOUNTER — Encounter: Payer: Self-pay | Admitting: Neurology

## 2018-06-16 DIAGNOSIS — E890 Postprocedural hypothyroidism: Secondary | ICD-10-CM | POA: Diagnosis not present

## 2018-06-19 MED FILL — TOPIRAMATE 50 MG TABLET: 50 | 30 days supply | Qty: 30 | Fill #0

## 2018-06-19 MED FILL — ALPRAZolam 0.5 MG TABS: 0.5 | 20 days supply | Qty: 20 | Fill #1

## 2018-06-20 DIAGNOSIS — E890 Postprocedural hypothyroidism: Secondary | ICD-10-CM | POA: Diagnosis not present

## 2018-06-25 ENCOUNTER — Telehealth: Payer: Self-pay | Admitting: *Deleted

## 2018-06-25 NOTE — Telephone Encounter (Signed)
The patient had her Lemtrada labs drawn on 06/12/2018:  CBC w/ Diff/Platelet:  wnl  Creatinine: 1.04 (H)  TSH: 5.120(H)  Normally, a routine urinalysis is also included with her monthly labs but it was not collected this month.  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  She had her labs collected in the parking lot and was unable to provide a urine sample due to lack of privacy.  If the COVID-19 restrictions are still in place next month, she will request the lab tech to come to her home so that the UA can be completed.

## 2018-06-30 MED FILL — XIIDRA 5% EYE DROPS: 5 | 30 days supply | Qty: 60 | Fill #0 | Status: TO

## 2018-06-30 MED FILL — LARIN 1.5 MG-30 MCG TABLET: 1.5-30 | 63 days supply | Qty: 63 | Fill #0

## 2018-06-30 MED FILL — FLUoxetine HCL 10 MG CAPS: 10 | 30 days supply | Qty: 30 | Fill #0

## 2018-07-01 ENCOUNTER — Telehealth: Payer: Self-pay | Admitting: *Deleted

## 2018-07-01 NOTE — Telephone Encounter (Signed)
Called pt back. Updated medication list, pharmacy, allergies on file for VV tomorrow. She confirmed she received email. Reminded her appt at 1pm tomorrow.

## 2018-07-01 NOTE — Telephone Encounter (Signed)
Patient returning  your call [phone].

## 2018-07-01 NOTE — Addendum Note (Signed)
Addended by: Eilene Ghazi L on: 07/01/2018 12:11 PM   Modules accepted: Orders

## 2018-07-01 NOTE — Telephone Encounter (Signed)
Called, LVM for pt to call office today for me to update her med list/pharmacy/allergies before her VV tomorrow with Dr. Epimenio Foot

## 2018-07-02 ENCOUNTER — Encounter: Payer: Self-pay | Admitting: Neurology

## 2018-07-02 ENCOUNTER — Ambulatory Visit (INDEPENDENT_AMBULATORY_CARE_PROVIDER_SITE_OTHER): Payer: 59 | Admitting: Neurology

## 2018-07-02 ENCOUNTER — Other Ambulatory Visit: Payer: Self-pay

## 2018-07-02 DIAGNOSIS — Z79899 Other long term (current) drug therapy: Secondary | ICD-10-CM | POA: Diagnosis not present

## 2018-07-02 DIAGNOSIS — Z8639 Personal history of other endocrine, nutritional and metabolic disease: Secondary | ICD-10-CM | POA: Diagnosis not present

## 2018-07-02 DIAGNOSIS — R5383 Other fatigue: Secondary | ICD-10-CM | POA: Diagnosis not present

## 2018-07-02 DIAGNOSIS — R269 Unspecified abnormalities of gait and mobility: Secondary | ICD-10-CM | POA: Diagnosis not present

## 2018-07-02 DIAGNOSIS — G35 Multiple sclerosis: Secondary | ICD-10-CM | POA: Diagnosis not present

## 2018-07-02 DIAGNOSIS — G43009 Migraine without aura, not intractable, without status migrainosus: Secondary | ICD-10-CM

## 2018-07-02 MED ORDER — TOPIRAMATE 50 MG PO TABS: ORAL_TABLET | ORAL | 3 refills | Status: DC

## 2018-07-02 NOTE — Progress Notes (Addendum)
[facility]  PATIENT: Carrie Nelson DOB: January 04, 1981  REFERRING CLINICIAN: Daniel Nones HISTORY FROM: Patient   REASON FOR VISIT: Multiple Sclerosis   HISTORICAL  CHIEF COMPLAINT:  Chief Complaint  Patient presents with   Multiple Sclerosis    Completed two years Lemtrada                                               f                                                                    HISTORY OF PRESENT ILLNESS:  [name] is a 38 y.o. woman who was diagnosed with MS in 2007.    Update 07/02/2018: Virtual Visit via Video Note I connected with Carrie Nelson on 07/02/18 at  1:00 PM EDT by a video enabled telemedicine application and verified that I am speaking with the correct person.  I discussed the limitations of evaluation and management by telemedicine and the availability of in person appointments. The patient expressed understanding and agreed to proceed.  Due to technical issues, the visit was converted to telephone  History of Present Illness: Her MS has done well and she denies any new MS symptoms or any exacerbations.  She completed a second course of Lemtrada November 2018.  She tolerated it very well and does have mild infusion reactions.  She is compliant with the monthly rems monitoring.  She was diagnosed with thyroid disorder treated with radioactive iodine and levothyroxine.  She has weakness and clumsiness in the right leg which is affecting her gait.  She stumbles some but has no falls.  Bladder function is fine.  Vision is doing well.  She notes fatigue that is improved by Concerta.  She tolerates it well.  She sleeps well most nights.  Mood has done well with fluoxetine.  She denies any current depression or anxiety.  She does not note any major cognitive issues.   She does have migraine headaches but they have done better on Topamax.  She is currently on 50 mg nightly and we discussed going up to 100 mg.  When the migraine  occurs she will take ibuprofen.  Observations/Objective: She is a well-developed well-nourished woman in no acute distress.  The head is normocephalic and atraumatic.  Sclera are anicteric.  Visible skin appears normal.  The neck has a good range of motion.  Pharynx and tongue have normal appearance.  She is alert and fully oriented with fluent speech and good attention, knowledge and memory.  Extraocular muscles are intact.  Facial strength is normal.  Palatal elevation and tongue protrusion are midline.  She appears to have normal strength in the arms.  Rapid alternating movements and finger-nose-finger are performed well.  Assessment and Plan: Multiple sclerosis (HCC)  Other fatigue  Gait disturbance  High risk medication use  H/O thyroid disease  Common migraine without intractability  1.   She will continue with the Lemtrada rems blood work and urinalysis on a monthly basis.  We went over signs and symptoms of ITP.  We discussed that being this far  out from her [place] the immune system has reconstituted and she should not be at additional risk for COVID-19. 2.  Continue Concerta for fatigue. 3.  Increase Topamax to 100 mg nightly. 4.  Stay active and exercise as tolerated. 5.  Return to see me in 6 months or sooner if there are new or worsening neurologic symptoms.  Follow Up Instructions: I discussed the assessment and treatment plan with the patient. The patient was provided an opportunity to ask questions and all were answered. The patient agreed with the plan and demonstrated an understanding of the instructions.    The patient was advised to call back or seek an in-person evaluation if the symptoms worsen or if the condition fails to improve as anticipated.  I provided 25 minutes of non-face-to-face time during this encounter. ___________________________________ From previous visits: Update 12/25/2017: She feels her MS has been stable.  She had her second course of Lemtrada  in November 2018.  She tolerated it well with just mild infusion reactions.  She has been compliant with monthly REMS monitoring.  Of note, she did have hypothyroidism that has been treated with radioactive iodine and levothyroxine..   She feels stable neurologically.  Her gait is doing about the same as she has no falls.  Her right leg is mildly clumsy but not weak.  Bladder function is fine.  Vision is fine.  She notes some fatigue.  In the past she was on Concerta with some benefit but discontinued when she was diagnosed with hypothyroidism.  Cognition is doing well.  Mood is doing well.    Her fatigue improved with Concerta.   It also helps her focus better.      She does have migraine headaches.  She is on Inderal LA 60 mg as an agent to help hyperthyroid symptoms. It helped migraine but not now.   It also makes her BP low (though not symptomatic).   She also takes Maxalt 10 mg as needed.  It often helps but she sometimes needs a second one and it makes her sleepy.   Migraines were very rare until recently.   She has migraines 1/week lasting several hours with intense pain but milder pain x 36 hours.       Update 06/24/2017: She had a second course of Lemtrada infusions November 2018.  She tolerated the infusions well with only mild .  Her MS has been stable she has not had any exacerbations.  She is compliant with monthly REMS monitoring.  She was recently diagnosed with hyperthyroidism and had 1 dose of radioactive iodine 5 weeks ago.  She feels gait is doing about the same.  The right leg is mildly clumsy compared to the left leg but she is not having any falls.  She notes no difficulties with her bladder.    Vision is fine.  She continues to have some fatigue both physical and mental.  Concerta was helping but she stopped when the hyperthyroidism was diagnosed.  .   She has mild difficulties with insomnia since the thyroid issues.   She denies cognitive issues.  She has no problems with  mood.  Update 12/25/2016:    She had her first treatment with [facility] November 2017 and will have her second 3 day course next month. She feels her MS has been very stable since starting [place]. Specifically there has not been any exacerbations. She had a few mild infusion reactions the week of the infusions but was fine by the next  Monday. She feels she is stable neurologically with no new numbness or weakness. The right leg is mildly clumsier and slightly weaker than the left leg. She notes this mostly when she tries to move fast. She denies any difficulties with her bladder. There is no problems with vision.  She has fatigue, physical and mental.   Concerta helps best when she takes mid morning.    Most days, she just takes one dose in the mid morning.  By taking it later, she has not needed the second dose.  She sleeps well at night.    She denies cognitive issues.     She denies any significant depression or anxiety.   She is on low dose Prozac with benefit   From 08/02/2015  MS:   She had her first week of Lemtrada infusions November 13 through 01/13/2016. She will get 3 additional days in November 2018. She tolerated the infusions fairly well though she did have some chills and headaches. She also felt weaker the next week.   Fatigue continues to be her major problem.  Gait/strength/sensation:    Gait strength and sensation are generally doing well. She sometimes drags the right leg when she is more tired notices that the right leg is weak when she climbs stairs sometimes there will be mild dysesthesias in that leg but they are never troubling.   Bladder: She denies urinary frequency, urgency or hesitancy..              Vision:   She denies any MS related vision problem. e.     Fatigue/sleep/cognition:     She has more fatigue than last year.   Concerta 36 mg once or twice a day helps.  She usually takes the first dose in the morning and will take a second dose around noon some days.  Besides MS fatigue, she notes less sleepiness in the afternoons.   She sleeps well at night.     She notes that her decreased focus is also better.    She denies memory problems.     Mood:   She denies any depression.   She notes mild anxiety helped by Prozac 10 mg   MS History:   She was diagnosed in 2007 but,  in retrospect, had right foot dragging that started around 2005. Then shortly after that she had an episode of left facial numbness. In 2007 she had an episode of slurred speech and worsening clumsiness.  MRI of the brain that was consistent with multiple sclerosis. She also had a lumbar puncture and her CSF compatible with the diagnosis of MS. Most of her symptoms improved over the next few months but she did not get 100% back to baseline and she continues to have minimal residual weakness, clumsiness and numbness. She was initially placed on Betaseron but switched to Tysabri because of some generalized side effects of Betaseron. She was seeing Dr. Tinnie GensJeffrey 2007 until 2014.   She was on Tysabri between 2008 and late 2014 when she stopped because of a desire to get pregnant. She became pregnant in early 2015 and delivered in November 2015. She had not been on any disease modifying therapy since late 2014 and then started Tecfidera 02/2014.Marland Kitchen.   She did not tolerate it well and [facility].     REVIEW OF SYSTEMS:  Constitutional: No fevers, chills, sweats, or change in appetite Eyes: No visual changes, double vision, eye pain Ear, nose and throat: No hearing loss, ear pain, nasal  congestion, sore throat Cardiovascular: No chest pain, palpitations Respiratory:  No shortness of breath at rest or with exertion.   No wheezes GastrointestinaI: No nausea, vomiting, diarrhea, abdominal pain, fecal incontinence Genitourinary:  No dysuria, urinary retention or frequency.  No nocturia. Musculoskeletal:  No neck pain, back pain Integumentary: No rash, pruritus, skin lesions Neurological: as  above Psychiatric: No depression at this time.  No anxiety.   Occasionally she has irritability. Endocrine: She she is borderline diabetic. No palpitations, diaphoresis, change in appetite, change in weigh or increased thirst Hematologic/Lymphatic:  No anemia, purpura, petechiae. Allergic/Immunologic: She has seasonal allergies and is treated with several medications.  ALLERGIES: No Known Allergies  HOME MEDICATIONS: Outpatient Medications Prior to Visit  Medication Sig Dispense Refill   Alemtuzumab (LEMTRADA) 12 MG/1.2ML SOLN Inject 12 mg into the vein.     ALPRAZolam (XANAX) 0.5 MG tablet Take 0.5 mg by mouth at bedtime as needed for anxiety.     aspirin EC 81 MG tablet Take 81 mg by mouth daily.      cholecalciferol (VITAMIN D) 1000 UNITS tablet Take 2,000 Units by mouth daily.      FLUoxetine (PROZAC) 10 MG capsule TAKE 1 CAPSULE BY MOUTH ONCE DAILY 30 capsule 4   LARIN 1.5/30 1.5-30 MG-MCG tablet TAKE 1 TABLET BY MOUTH DAILY 63 tablet 5   Levothyroxine Sodium 175 MCG CAPS Take 137 mcg by mouth daily.      methylphenidate (CONCERTA) 36 MG PO CR tablet TAKE 1 TO 2 TABLETS BY MOUTH DAILY AS NEEDED 90 tablet 0   montelukast (SINGULAIR) 10 MG tablet Take 10 mg by mouth at bedtime.   3   Multiple Vitamins-Minerals (MULTIVITAMIN PO) Take by mouth.     saccharomyces boulardii (FLORASTOR) 250 MG capsule Take by mouth.     XIIDRA 5 % SOLN   6   topiramate (TOPAMAX) 50 MG tablet Take 1 tablet (50 mg total) by mouth at bedtime. 30 tablet 11   No facility-administered medications prior to visit.     PAST MEDICAL HISTORY: Past Medical History:  Diagnosis Date   Multiple sclerosis (HCC)    Syncope    Thyroid disease    Tremor     PAST SURGICAL HISTORY: Past Surgical History:  Procedure Laterality Date   BUNIONECTOMY     FOOT SURGERY     KNEE ARTHROSCOPY W/ ACL RECONSTRUCTION Left 2010    FAMILY HISTORY: Family History  Problem Relation Age of Onset    Prostate cancer Father    Diabetes Father    Arthritis Unknown    Dementia Unknown    Hypertension Unknown    Heart disease Unknown     SOCIAL HISTORY:  Social History   Socioeconomic History   Marital status: Married    Spouse name: Not on file   Number of children: o   Years of education: Not on file   Highest education level: Not on file  Occupational History   Not on file  Social Needs   Financial resource strain: Not on file   Food insecurity:    Worry: Not on file    Inability: Not on file   Transportation needs:    Medical: Not on file    Non-medical: Not on file  Tobacco Use   Smoking status: Never Smoker   Smokeless tobacco: Never Used  Substance and Sexual Activity   Alcohol use: Yes    Comment: minimal weekly    Drug use: No   Sexual activity: Yes  Birth control/protection: Pill  Lifestyle   Physical activity:    Days per week: Not on file    Minutes per session: Not on file   Stress: Not on file  Relationships   Social connections:    Talks on phone: Not on file    Gets together: Not on file    Attends religious service: Not on file    Active member of club or organization: Not on file    Attends meetings of clubs or organizations: Not on file    Relationship status: Not on file   Intimate partner violence:    Fear of current or ex partner: Not on file    Emotionally abused: Not on file    Physically abused: Not on file    Forced sexual activity: Not on file  Other Topics Concern   Not on file  Social History Narrative   Patient lives with parents.   [employer] an [employer].   Patient is single.    Patient has a Probation officer.      PHYSICAL EXAM  There were no vitals filed for this visit.  There is no height or weight on file to calculate BMI.   General: The patient is well-developed and well-nourished and in no acute distress   Neurologic  Exam  Mental status: The patient is alert and oriented x 3 at the time of the examination. The patient has apparent normal recent and remote memory, with an apparently normal attention span and concentration ability.   Speech is normal.  Cranial nerves:   Extraocular muscles are intact.  Facial strength and sensation is normal.  Trapezius strength is normal.  Hearing is normal symmetric  Motor:  Muscle bulk and tone are normal. Strength is  5 / 5 in all 4 extremities.   Sensory: She has intact sensation to touch and vibration.  Coordination: She has good finger-nose-finger and heel-to-shin bilaterally.  Gait and station: Station and gait are normal.  Her tandem gait is mildly wide.  Romberg is negative. Reflexes: Deep tendon reflexes are symmetric and normal in arms. The right knee reflex is mildly increased compared to the left.  Symmetric ankle reflexes    DIAGNOSTIC DATA (LABS, IMAGING, TESTING) - I reviewed patient records, labs, notes, testing and imaging myself where available.  Lab Results  Component Value Date   WBC 4.1 10/18/2015   HGB 13.1 10/18/2015   HCT 39.7 10/18/2015   MCV 93 10/18/2015   PLT 377 10/18/2015      Component Value Date/Time   NA 136 08/02/2017 1650   NA 141 10/18/2015 1615   NA 138 05/09/2012 0830   K 3.7 08/02/2017 1650   K 3.7 05/09/2012 0830   CL 103 08/02/2017 1650   CL 108 (H) 05/09/2012 0830   CO2 24 08/02/2017 1650   CO2 23 05/09/2012 0830   GLUCOSE 98 08/02/2017 1650   GLUCOSE 84 05/09/2012 0830   BUN 16 08/02/2017 1650   BUN 13 10/18/2015 1615   BUN 16 05/09/2012 0830   CREATININE 1.02 (H) 08/02/2017 1650   CREATININE 0.95 05/09/2012 0830   CALCIUM 9.5 08/02/2017 1650   CALCIUM 8.6 05/09/2012 0830   PROT 6.7 10/18/2015 1615   PROT 6.7 05/09/2012 0830   ALBUMIN 4.2 10/18/2015 1615   ALBUMIN 3.4 05/09/2012 0830   AST 12 10/18/2015 1615   AST 17 05/09/2012 0830   ALT 9 10/18/2015 1615   ALT 16 05/09/2012 0830  ALKPHOS 43  10/18/2015 1615   ALKPHOS 55 05/09/2012 0830   BILITOT 0.3 10/18/2015 1615   BILITOT 0.7 05/09/2012 0830   GFRNONAA >60 08/02/2017 1650   GFRNONAA >60 05/09/2012 0830   GFRAA >60 08/02/2017 1650   GFRAA >60 05/09/2012 0830        ASSESSMENT AND PLAN  Multiple sclerosis (HCC)  Other fatigue  Gait disturbance  High risk medication use  H/O thyroid disease  Common migraine without intractability    Bolivar Koranda A. Epimenio Foot, MD, PhD 07/02/2018, 6:27 PM Certified in Neurology, Clinical Neurophysiology, Sleep Medicine, Pain Medicine and Neuroimaging  [facility] [address] [place], [place] [address] [phone]-

## 2018-07-11 MED FILL — ALPRAZolam 0.5 MG TABS: 0.5 | 20 days supply | Qty: 20 | Fill #2 | Status: TO

## 2018-07-17 MED FILL — MONTELUKAST SOD 10 MG TAB: 10 | 90 days supply | Qty: 90 | Fill #0

## 2018-08-07 ENCOUNTER — Other Ambulatory Visit: Payer: Self-pay | Admitting: Neurology

## 2018-08-07 ENCOUNTER — Other Ambulatory Visit: Payer: Self-pay | Admitting: Obstetrics and Gynecology

## 2018-08-08 DIAGNOSIS — E890 Postprocedural hypothyroidism: Secondary | ICD-10-CM | POA: Diagnosis not present

## 2018-08-28 ENCOUNTER — Telehealth: Payer: Self-pay | Admitting: *Deleted

## 2018-08-28 NOTE — Telephone Encounter (Signed)
Called pt. Advised I reviewed her chart and she is past due for monthly labs for Lemtrada. Her last lab draw was 07/15/18. Pt aware and in contact with lab scheduler. She will reach out to get lab draw scheduled. They had schedule conflicts prior. She will call me back if she has any issues getting scheduled.   Phone number to lab scheduling for home draw: [phone].

## 2018-09-02 ENCOUNTER — Other Ambulatory Visit: Payer: Self-pay | Admitting: Obstetrics and Gynecology

## 2018-09-02 NOTE — Telephone Encounter (Signed)
Called pt. Advised home lab draw program that lemtrada program was using shut down unexpectedly. She was agreeable to go to [facility] near her for monthly labs. Aware that lemtrada rems program actively looking for new phlebotomy lab to do home draws.  I faxed orders to [address] [place], [place] [address]. Phone: [phone]. Fax: [phone]. Received fax confirmation. Pt given phone number and address.

## 2018-09-04 ENCOUNTER — Encounter: Payer: Self-pay | Admitting: Neurology

## 2018-09-19 ENCOUNTER — Other Ambulatory Visit: Payer: Self-pay | Admitting: Neurology

## 2018-09-29 ENCOUNTER — Other Ambulatory Visit: Payer: Self-pay | Admitting: Obstetrics and Gynecology

## 2018-10-07 DIAGNOSIS — R51 Headache: Secondary | ICD-10-CM | POA: Diagnosis not present

## 2018-10-07 DIAGNOSIS — M5416 Radiculopathy, lumbar region: Secondary | ICD-10-CM | POA: Diagnosis not present

## 2018-10-07 DIAGNOSIS — M9901 Segmental and somatic dysfunction of cervical region: Secondary | ICD-10-CM | POA: Diagnosis not present

## 2018-10-07 DIAGNOSIS — M9903 Segmental and somatic dysfunction of lumbar region: Secondary | ICD-10-CM | POA: Diagnosis not present

## 2018-10-08 DIAGNOSIS — M9903 Segmental and somatic dysfunction of lumbar region: Secondary | ICD-10-CM | POA: Diagnosis not present

## 2018-10-08 DIAGNOSIS — M9901 Segmental and somatic dysfunction of cervical region: Secondary | ICD-10-CM | POA: Diagnosis not present

## 2018-10-08 DIAGNOSIS — R51 Headache: Secondary | ICD-10-CM | POA: Diagnosis not present

## 2018-10-08 DIAGNOSIS — M5416 Radiculopathy, lumbar region: Secondary | ICD-10-CM | POA: Diagnosis not present

## 2018-10-09 DIAGNOSIS — M9901 Segmental and somatic dysfunction of cervical region: Secondary | ICD-10-CM | POA: Diagnosis not present

## 2018-10-09 DIAGNOSIS — R51 Headache: Secondary | ICD-10-CM | POA: Diagnosis not present

## 2018-10-09 DIAGNOSIS — M5416 Radiculopathy, lumbar region: Secondary | ICD-10-CM | POA: Diagnosis not present

## 2018-10-09 DIAGNOSIS — M9903 Segmental and somatic dysfunction of lumbar region: Secondary | ICD-10-CM | POA: Diagnosis not present

## 2018-10-10 DIAGNOSIS — R51 Headache: Secondary | ICD-10-CM | POA: Diagnosis not present

## 2018-10-10 DIAGNOSIS — M9901 Segmental and somatic dysfunction of cervical region: Secondary | ICD-10-CM | POA: Diagnosis not present

## 2018-10-10 DIAGNOSIS — M9903 Segmental and somatic dysfunction of lumbar region: Secondary | ICD-10-CM | POA: Diagnosis not present

## 2018-10-10 DIAGNOSIS — M5416 Radiculopathy, lumbar region: Secondary | ICD-10-CM | POA: Diagnosis not present

## 2018-10-13 DIAGNOSIS — M5416 Radiculopathy, lumbar region: Secondary | ICD-10-CM | POA: Diagnosis not present

## 2018-10-13 DIAGNOSIS — M9901 Segmental and somatic dysfunction of cervical region: Secondary | ICD-10-CM | POA: Diagnosis not present

## 2018-10-13 DIAGNOSIS — R51 Headache: Secondary | ICD-10-CM | POA: Diagnosis not present

## 2018-10-13 DIAGNOSIS — M9903 Segmental and somatic dysfunction of lumbar region: Secondary | ICD-10-CM | POA: Diagnosis not present

## 2018-10-15 DIAGNOSIS — E7849 Other hyperlipidemia: Secondary | ICD-10-CM | POA: Diagnosis not present

## 2018-10-15 DIAGNOSIS — M9901 Segmental and somatic dysfunction of cervical region: Secondary | ICD-10-CM | POA: Diagnosis not present

## 2018-10-15 DIAGNOSIS — R51 Headache: Secondary | ICD-10-CM | POA: Diagnosis not present

## 2018-10-15 DIAGNOSIS — G35 Multiple sclerosis: Secondary | ICD-10-CM | POA: Diagnosis not present

## 2018-10-15 DIAGNOSIS — M5416 Radiculopathy, lumbar region: Secondary | ICD-10-CM | POA: Diagnosis not present

## 2018-10-15 DIAGNOSIS — M9903 Segmental and somatic dysfunction of lumbar region: Secondary | ICD-10-CM | POA: Diagnosis not present

## 2018-10-16 DIAGNOSIS — G35 Multiple sclerosis: Secondary | ICD-10-CM | POA: Diagnosis not present

## 2018-10-16 DIAGNOSIS — Z8639 Personal history of other endocrine, nutritional and metabolic disease: Secondary | ICD-10-CM | POA: Diagnosis not present

## 2018-10-16 DIAGNOSIS — Z0000 Encounter for general adult medical examination without abnormal findings: Secondary | ICD-10-CM | POA: Diagnosis not present

## 2018-10-16 DIAGNOSIS — E7849 Other hyperlipidemia: Secondary | ICD-10-CM | POA: Diagnosis not present

## 2018-10-17 DIAGNOSIS — M5416 Radiculopathy, lumbar region: Secondary | ICD-10-CM | POA: Diagnosis not present

## 2018-10-17 DIAGNOSIS — M9901 Segmental and somatic dysfunction of cervical region: Secondary | ICD-10-CM | POA: Diagnosis not present

## 2018-10-17 DIAGNOSIS — R51 Headache: Secondary | ICD-10-CM | POA: Diagnosis not present

## 2018-10-17 DIAGNOSIS — M9903 Segmental and somatic dysfunction of lumbar region: Secondary | ICD-10-CM | POA: Diagnosis not present

## 2018-10-20 DIAGNOSIS — M5416 Radiculopathy, lumbar region: Secondary | ICD-10-CM | POA: Diagnosis not present

## 2018-10-20 DIAGNOSIS — M9901 Segmental and somatic dysfunction of cervical region: Secondary | ICD-10-CM | POA: Diagnosis not present

## 2018-10-20 DIAGNOSIS — M9903 Segmental and somatic dysfunction of lumbar region: Secondary | ICD-10-CM | POA: Diagnosis not present

## 2018-10-20 DIAGNOSIS — R51 Headache: Secondary | ICD-10-CM | POA: Diagnosis not present

## 2018-10-22 DIAGNOSIS — M5416 Radiculopathy, lumbar region: Secondary | ICD-10-CM | POA: Diagnosis not present

## 2018-10-22 DIAGNOSIS — M9903 Segmental and somatic dysfunction of lumbar region: Secondary | ICD-10-CM | POA: Diagnosis not present

## 2018-10-22 DIAGNOSIS — R51 Headache: Secondary | ICD-10-CM | POA: Diagnosis not present

## 2018-10-22 DIAGNOSIS — M9901 Segmental and somatic dysfunction of cervical region: Secondary | ICD-10-CM | POA: Diagnosis not present

## 2018-10-23 DIAGNOSIS — M5416 Radiculopathy, lumbar region: Secondary | ICD-10-CM | POA: Diagnosis not present

## 2018-10-23 DIAGNOSIS — M9901 Segmental and somatic dysfunction of cervical region: Secondary | ICD-10-CM | POA: Diagnosis not present

## 2018-10-23 DIAGNOSIS — H5213 Myopia, bilateral: Secondary | ICD-10-CM | POA: Diagnosis not present

## 2018-10-23 DIAGNOSIS — R51 Headache: Secondary | ICD-10-CM | POA: Diagnosis not present

## 2018-10-23 DIAGNOSIS — M9903 Segmental and somatic dysfunction of lumbar region: Secondary | ICD-10-CM | POA: Diagnosis not present

## 2018-10-24 DIAGNOSIS — E890 Postprocedural hypothyroidism: Secondary | ICD-10-CM | POA: Diagnosis not present

## 2018-10-28 DIAGNOSIS — M9901 Segmental and somatic dysfunction of cervical region: Secondary | ICD-10-CM | POA: Diagnosis not present

## 2018-10-28 DIAGNOSIS — M9903 Segmental and somatic dysfunction of lumbar region: Secondary | ICD-10-CM | POA: Diagnosis not present

## 2018-10-28 DIAGNOSIS — M5416 Radiculopathy, lumbar region: Secondary | ICD-10-CM | POA: Diagnosis not present

## 2018-10-28 DIAGNOSIS — R51 Headache: Secondary | ICD-10-CM | POA: Diagnosis not present

## 2018-10-29 DIAGNOSIS — M9903 Segmental and somatic dysfunction of lumbar region: Secondary | ICD-10-CM | POA: Diagnosis not present

## 2018-10-29 DIAGNOSIS — M9901 Segmental and somatic dysfunction of cervical region: Secondary | ICD-10-CM | POA: Diagnosis not present

## 2018-10-29 DIAGNOSIS — M5416 Radiculopathy, lumbar region: Secondary | ICD-10-CM | POA: Diagnosis not present

## 2018-10-29 DIAGNOSIS — R51 Headache: Secondary | ICD-10-CM | POA: Diagnosis not present

## 2018-11-05 DIAGNOSIS — M9903 Segmental and somatic dysfunction of lumbar region: Secondary | ICD-10-CM | POA: Diagnosis not present

## 2018-11-05 DIAGNOSIS — R51 Headache: Secondary | ICD-10-CM | POA: Diagnosis not present

## 2018-11-05 DIAGNOSIS — M9901 Segmental and somatic dysfunction of cervical region: Secondary | ICD-10-CM | POA: Diagnosis not present

## 2018-11-05 DIAGNOSIS — M5416 Radiculopathy, lumbar region: Secondary | ICD-10-CM | POA: Diagnosis not present

## 2018-11-06 DIAGNOSIS — R51 Headache: Secondary | ICD-10-CM | POA: Diagnosis not present

## 2018-11-06 DIAGNOSIS — M9903 Segmental and somatic dysfunction of lumbar region: Secondary | ICD-10-CM | POA: Diagnosis not present

## 2018-11-06 DIAGNOSIS — M5416 Radiculopathy, lumbar region: Secondary | ICD-10-CM | POA: Diagnosis not present

## 2018-11-06 DIAGNOSIS — M9901 Segmental and somatic dysfunction of cervical region: Secondary | ICD-10-CM | POA: Diagnosis not present

## 2018-11-10 ENCOUNTER — Other Ambulatory Visit: Payer: Self-pay | Admitting: Neurology

## 2018-11-10 NOTE — Telephone Encounter (Signed)
[place] Database Verified LR: 09-22-2018 Qty: 90 Pending appointment: 01-05-2019

## 2018-11-11 DIAGNOSIS — M9901 Segmental and somatic dysfunction of cervical region: Secondary | ICD-10-CM | POA: Diagnosis not present

## 2018-11-11 DIAGNOSIS — R51 Headache: Secondary | ICD-10-CM | POA: Diagnosis not present

## 2018-11-11 DIAGNOSIS — M9903 Segmental and somatic dysfunction of lumbar region: Secondary | ICD-10-CM | POA: Diagnosis not present

## 2018-11-11 DIAGNOSIS — M5416 Radiculopathy, lumbar region: Secondary | ICD-10-CM | POA: Diagnosis not present

## 2018-11-13 ENCOUNTER — Ambulatory Visit (INDEPENDENT_AMBULATORY_CARE_PROVIDER_SITE_OTHER): Payer: 59 | Admitting: Obstetrics and Gynecology

## 2018-11-13 ENCOUNTER — Other Ambulatory Visit: Payer: Self-pay

## 2018-11-13 ENCOUNTER — Encounter: Payer: Self-pay | Admitting: Obstetrics and Gynecology

## 2018-11-13 VITALS — BP 109/72 | HR 97 | Ht 62.0 in | Wt 146.5 lb

## 2018-11-13 DIAGNOSIS — F419 Anxiety disorder, unspecified: Secondary | ICD-10-CM

## 2018-11-13 DIAGNOSIS — G479 Sleep disorder, unspecified: Secondary | ICD-10-CM | POA: Diagnosis not present

## 2018-11-13 DIAGNOSIS — Z6826 Body mass index (BMI) 26.0-26.9, adult: Secondary | ICD-10-CM

## 2018-11-13 DIAGNOSIS — R634 Abnormal weight loss: Secondary | ICD-10-CM | POA: Diagnosis not present

## 2018-11-13 DIAGNOSIS — M9901 Segmental and somatic dysfunction of cervical region: Secondary | ICD-10-CM | POA: Diagnosis not present

## 2018-11-13 DIAGNOSIS — Z01419 Encounter for gynecological examination (general) (routine) without abnormal findings: Secondary | ICD-10-CM

## 2018-11-13 DIAGNOSIS — M9903 Segmental and somatic dysfunction of lumbar region: Secondary | ICD-10-CM | POA: Diagnosis not present

## 2018-11-13 DIAGNOSIS — R51 Headache: Secondary | ICD-10-CM | POA: Diagnosis not present

## 2018-11-13 DIAGNOSIS — M5416 Radiculopathy, lumbar region: Secondary | ICD-10-CM | POA: Diagnosis not present

## 2018-11-13 MED ORDER — ALPRAZOLAM 0.5 MG PO TABS: 0.5000 mg | ORAL_TABLET | Freq: Every evening | ORAL | 2 refills | Status: DC | PRN

## 2018-11-13 MED ORDER — NORETHINDRONE ACET-ETHINYL EST 1.5-30 MG-MCG PO TABS: 1.0000 | ORAL_TABLET | Freq: Every day | ORAL | 5 refills | Status: DC

## 2018-11-13 NOTE — Patient Instructions (Signed)
 Preventive Care 21-39 Years Old, Female Preventive care refers to visits with your health care provider and lifestyle choices that can promote health and wellness. This includes:  A yearly physical exam. This may also be called an annual well check.  Regular dental visits and eye exams.  Immunizations.  Screening for certain conditions.  Healthy lifestyle choices, such as eating a healthy diet, getting regular exercise, not using drugs or products that contain nicotine and tobacco, and limiting alcohol use. What can I expect for my preventive care visit? Physical exam Your health care provider will check your:  Height and weight. This may be used to calculate body mass index (BMI), which tells if you are at a healthy weight.  Heart rate and blood pressure.  Skin for abnormal spots. Counseling Your health care provider may ask you questions about your:  Alcohol, tobacco, and drug use.  Emotional well-being.  Home and relationship well-being.  Sexual activity.  Eating habits.  Work and work environment.  Method of birth control.  Menstrual cycle.  Pregnancy history. What immunizations do I need?  Influenza (flu) vaccine  This is recommended every year. Tetanus, diphtheria, and pertussis (Tdap) vaccine  You may need a Td booster every 10 years. Varicella (chickenpox) vaccine  You may need this if you have not been vaccinated. Human papillomavirus (HPV) vaccine  If recommended by your health care provider, you may need three doses over 6 months. Measles, mumps, and rubella (MMR) vaccine  You may need at least one dose of MMR. You may also need a second dose. Meningococcal conjugate (MenACWY) vaccine  One dose is recommended if you are age 19-21 years and a first-year college student living in a residence hall, or if you have one of several medical conditions. You may also need additional booster doses. Pneumococcal conjugate (PCV13) vaccine  You may need  this if you have certain conditions and were not previously vaccinated. Pneumococcal polysaccharide (PPSV23) vaccine  You may need one or two doses if you smoke cigarettes or if you have certain conditions. Hepatitis A vaccine  You may need this if you have certain conditions or if you travel or work in places where you may be exposed to hepatitis A. Hepatitis B vaccine  You may need this if you have certain conditions or if you travel or work in places where you may be exposed to hepatitis B. Haemophilus influenzae type b (Hib) vaccine  You may need this if you have certain conditions. You may receive vaccines as individual doses or as more than one vaccine together in one shot (combination vaccines). Talk with your health care provider about the risks and benefits of combination vaccines. What tests do I need?  Blood tests  Lipid and cholesterol levels. These may be checked every 5 years starting at age 20.  Hepatitis C test.  Hepatitis B test. Screening  Diabetes screening. This is done by checking your blood sugar (glucose) after you have not eaten for a while (fasting).  Sexually transmitted disease (STD) testing.  BRCA-related cancer screening. This may be done if you have a family history of breast, ovarian, tubal, or peritoneal cancers.  Pelvic exam and Pap test. This may be done every 3 years starting at age 21. Starting at age 30, this may be done every 5 years if you have a Pap test in combination with an HPV test. Talk with your health care provider about your test results, treatment options, and if necessary, the need for more   tests. Follow these instructions at home: Eating and drinking   Eat a diet that includes fresh fruits and vegetables, whole grains, lean protein, and low-fat dairy.  Take vitamin and mineral supplements as recommended by your health care provider.  Do not drink alcohol if: ? Your health care provider tells you not to drink. ? You are  pregnant, may be pregnant, or are planning to become pregnant.  If you drink alcohol: ? Limit how much you have to 0-1 drink a day. ? Be aware of how much alcohol is in your drink. In the [place] one drink equals one 12 oz bottle of beer (355 mL), one 5 oz glass of wine (148 mL), or one 1 oz glass of hard liquor (44 mL). Lifestyle  Take daily care of your teeth and gums.  Stay active. Exercise for at least 30 minutes on 5 or more days each week.  Do not use any products that contain nicotine or tobacco, such as cigarettes, e-cigarettes, and chewing tobacco. If you need help quitting, ask your health care provider.  If you are sexually active, practice safe sex. Use a condom or other form of birth control (contraception) in order to prevent pregnancy and STIs (sexually transmitted infections). If you plan to become pregnant, see your health care provider for a preconception visit. What's next?  Visit your health care provider once a year for a well check visit.  Ask your health care provider how often you should have your eyes and teeth checked.  Stay up to date on all vaccines. This information is not intended to replace advice given to you by your health care provider. Make sure you discuss any questions you have with your health care provider. Document Released: 04/10/2001 Document Revised: 10/24/2017 Document Reviewed: 10/24/2017 Elsevier Patient Education  2020 [employer].

## 2018-11-13 NOTE — Progress Notes (Signed)
Subjective:   Carrie Nelson is a 38 y.o. G72P1001 Caucasian female here for a routine well-woman exam.  No LMP recorded. (Menstrual status: Oral contraceptives).   Has lost 48 #s since last years visit due to stress.  Current complaints: increased stressors with pandemic and spouse currently in treatment for alcoholism. Weight loss due to stress as she has not been exercising and has eat less. Is in counseling with EAP regularly. Questioning dose change on prozac.   Depression screen [facility] 2/9 11/13/2018  Decreased Interest 0  Down, Depressed, Hopeless 0  PHQ - 2 Score 0  Altered sleeping 1  Tired, decreased energy 2  Feeling bad or failure about yourself  1  Trouble concentrating 1  Moving slowly or fidgety/restless 2  Suicidal thoughts 0  PHQ-9 Score 7  Difficult doing work/chores Somewhat difficult   PCP: Caryl Comes       Doesn't need labs as PCP just did them.  Social History: Sexual: heterosexual Marital Status: married Living situation: with family Occupation: Therapist, sports in [employer], physiological at [employer] Tobacco/alcohol: no tobacco use Illicit drugs: no history of illicit drug use  The following portions of the patient's history were reviewed and updated as appropriate: allergies, current medications, past family history, past medical history, past social history, past surgical history and problem list.  Past Medical History Past Medical History:  Diagnosis Date  . Multiple sclerosis ([place])   . Syncope   . Thyroid disease   . Tremor     Past Surgical History Past Surgical History:  Procedure Laterality Date  . BUNIONECTOMY    . FOOT SURGERY    . KNEE ARTHROSCOPY W/ ACL RECONSTRUCTION Left 2010    Gynecologic History G1P1001  No LMP recorded. (Menstrual status: Oral contraceptives). Contraception: OCP (estrogen/progesterone) Last Pap: 2018. Results were: normal Last mammogram: never had one   Obstetric History OB History  Gravida Para Term Preterm AB Living  1 1 1     1   SAB TAB  Ectopic Multiple Live Births          1    # Outcome Date GA Lbr Len/2nd Weight Sex Delivery Anes PTL Lv  1 Term 01/22/14    M CS-LTranv   LIV    Current Medications Current Outpatient Medications on File Prior to Visit  Medication Sig Dispense Refill  . ALPRAZolam (XANAX) 0.5 MG tablet Take 0.5 mg by mouth at bedtime as needed for anxiety.    Marland Kitchen aspirin EC 81 MG tablet Take 81 mg by mouth daily.     . cholecalciferol (VITAMIN D) 1000 UNITS tablet Take 2,000 Units by mouth daily.     Marland Kitchen FLUoxetine (PROZAC) 10 MG capsule TAKE 1 CAPSULE BY MOUTH ONCE DAILY 30 capsule 6  . LARIN 1.5/30 1.5-30 MG-MCG tablet TAKE 1 TABLET BY MOUTH DAILY 63 tablet 5  . Levothyroxine Sodium 175 MCG CAPS Take 137 mcg by mouth daily.     . methylphenidate (CONCERTA) 36 MG PO CR tablet TAKE 1 TO 2 TABLETS BY MOUTH DAILY AS NEEDED 90 tablet 0  . montelukast (SINGULAIR) 10 MG tablet Take 10 mg by mouth at bedtime.   3  . Multiple Vitamins-Minerals (MULTIVITAMIN PO) Take by mouth.    . saccharomyces boulardii (FLORASTOR) 250 MG capsule Take by mouth.    . topiramate (TOPAMAX) 50 MG tablet One or two po nightly 180 tablet 3  . XIIDRA 5 % SOLN   6  . Alemtuzumab (LEMTRADA) 12 MG/1.2ML SOLN Inject 12 mg into the  vein.     No current facility-administered medications on file prior to visit.     Review of Systems Patient denies any headaches, blurred vision, shortness of breath, chest pain, abdominal pain, problems with bowel movements, urination, or intercourse.  Objective:  BP 109/72   Pulse 97   Ht 5\' 2"  (1.575 m)   Wt 146 lb 8 oz (66.5 kg)   BMI 26.80 kg/m  Physical Exam  General:  Well developed, well nourished, no acute distress. She is alert and oriented x3. Skin:  Warm and dry Neck:  Midline trachea, no thyromegaly or nodules Cardiovascular: Regular rate and rhythm, no murmur heard Lungs:  Effort normal, all lung fields clear to auscultation bilaterally Breasts:  No dominant palpable mass, retraction,  or nipple discharge Abdomen:  Soft, non tender, no hepatosplenomegaly or masses Pelvic:  External genitalia is normal in appearance.  The vagina is normal in appearance. The cervix is bulbous, no CMT.  Thin prep pap is not done . Uterus is felt to be normal size, shape, and contour.  No adnexal masses or tenderness noted. Extremities:  No swelling or varicosities noted Psych:  She has a normal mood and affect  Assessment:   Healthy well-woman exam BMI 26 Thyroid disorder Anxiety-on SSRI Unintended weight loss Sleep disturbance  Plan:  Will increase prozac to 20mg  daily and will let me know in a few weeks how it is helping. Will consider Abilify if that doesn't work.  Refilled OCPs and xanax. F/U 1 year for AE, or sooner if needed Mammogram ordered  Melody Suzan NailerN Shambley, CNM

## 2018-11-19 DIAGNOSIS — M5416 Radiculopathy, lumbar region: Secondary | ICD-10-CM | POA: Diagnosis not present

## 2018-11-19 DIAGNOSIS — R51 Headache: Secondary | ICD-10-CM | POA: Diagnosis not present

## 2018-11-19 DIAGNOSIS — M9901 Segmental and somatic dysfunction of cervical region: Secondary | ICD-10-CM | POA: Diagnosis not present

## 2018-11-19 DIAGNOSIS — M9903 Segmental and somatic dysfunction of lumbar region: Secondary | ICD-10-CM | POA: Diagnosis not present

## 2018-11-20 ENCOUNTER — Other Ambulatory Visit: Payer: Self-pay | Admitting: *Deleted

## 2018-11-20 MED ORDER — FLUOXETINE HCL 20 MG PO CAPS: ORAL_CAPSULE | ORAL | 4 refills | Status: DC

## 2018-11-25 DIAGNOSIS — M9903 Segmental and somatic dysfunction of lumbar region: Secondary | ICD-10-CM | POA: Diagnosis not present

## 2018-11-25 DIAGNOSIS — M5416 Radiculopathy, lumbar region: Secondary | ICD-10-CM | POA: Diagnosis not present

## 2018-11-25 DIAGNOSIS — M9901 Segmental and somatic dysfunction of cervical region: Secondary | ICD-10-CM | POA: Diagnosis not present

## 2018-11-25 DIAGNOSIS — R51 Headache: Secondary | ICD-10-CM | POA: Diagnosis not present

## 2018-12-02 DIAGNOSIS — M9903 Segmental and somatic dysfunction of lumbar region: Secondary | ICD-10-CM | POA: Diagnosis not present

## 2018-12-02 DIAGNOSIS — M5416 Radiculopathy, lumbar region: Secondary | ICD-10-CM | POA: Diagnosis not present

## 2018-12-02 DIAGNOSIS — R519 Headache, unspecified: Secondary | ICD-10-CM | POA: Diagnosis not present

## 2018-12-02 DIAGNOSIS — M9901 Segmental and somatic dysfunction of cervical region: Secondary | ICD-10-CM | POA: Diagnosis not present

## 2018-12-09 DIAGNOSIS — M5416 Radiculopathy, lumbar region: Secondary | ICD-10-CM | POA: Diagnosis not present

## 2018-12-09 DIAGNOSIS — M9903 Segmental and somatic dysfunction of lumbar region: Secondary | ICD-10-CM | POA: Diagnosis not present

## 2018-12-09 DIAGNOSIS — R519 Headache, unspecified: Secondary | ICD-10-CM | POA: Diagnosis not present

## 2018-12-09 DIAGNOSIS — M9901 Segmental and somatic dysfunction of cervical region: Secondary | ICD-10-CM | POA: Diagnosis not present

## 2018-12-22 DIAGNOSIS — M9903 Segmental and somatic dysfunction of lumbar region: Secondary | ICD-10-CM | POA: Diagnosis not present

## 2018-12-22 DIAGNOSIS — M9901 Segmental and somatic dysfunction of cervical region: Secondary | ICD-10-CM | POA: Diagnosis not present

## 2018-12-22 DIAGNOSIS — M5416 Radiculopathy, lumbar region: Secondary | ICD-10-CM | POA: Diagnosis not present

## 2018-12-22 DIAGNOSIS — R519 Headache, unspecified: Secondary | ICD-10-CM | POA: Diagnosis not present

## 2018-12-24 ENCOUNTER — Telehealth: Payer: Self-pay | Admitting: *Deleted

## 2018-12-24 NOTE — Telephone Encounter (Signed)
Pt returned call, advised of message below, pt is requesting a call back to discuss home visit on [place] labs

## 2018-12-24 NOTE — Telephone Encounter (Signed)
Called, LVM for pt (ok per DPR) letting her know she is past due for [facility] labs. Per Dr. Felecia Shelling, ok to wait until f/u on 01/05/19 at 1pm, check in 1230pm to do labs. Provided [facility] phone number if she has further questions/concerns.

## 2018-12-24 NOTE — Telephone Encounter (Signed)
Faxed new lab orders to [facility] at [phone]. Received fax confirmation. Pt will call once she gets scheduled. She is aware of her upcoming f/u.

## 2018-12-24 NOTE — Telephone Encounter (Signed)
Called pt back. She would like to get back enrolled in home draw program. She spoke with nurse who did this previously who recommended she contact our office. Advised we will try and send new orders. She will let me know if she can schedule with them. Aware we were told they are only doing non-ambulatory pt right now.  But pt would like to see if she can get enrolled. Advised I will have Dr. Felecia Shelling sign new orders and fax this over. She verbalized understanding.

## 2018-12-26 ENCOUNTER — Other Ambulatory Visit: Payer: Self-pay | Admitting: Neurology

## 2019-01-05 ENCOUNTER — Ambulatory Visit: Payer: 59 | Admitting: Neurology

## 2019-01-05 ENCOUNTER — Encounter: Payer: Self-pay | Admitting: Neurology

## 2019-01-05 ENCOUNTER — Other Ambulatory Visit: Payer: Self-pay

## 2019-01-05 VITALS — BP 125/77 | HR 88 | Temp 98.2°F | Ht 62.0 in | Wt 150.0 lb

## 2019-01-05 DIAGNOSIS — Z79899 Other long term (current) drug therapy: Secondary | ICD-10-CM | POA: Diagnosis not present

## 2019-01-05 DIAGNOSIS — G43009 Migraine without aura, not intractable, without status migrainosus: Secondary | ICD-10-CM | POA: Diagnosis not present

## 2019-01-05 DIAGNOSIS — E0590 Thyrotoxicosis, unspecified without thyrotoxic crisis or storm: Secondary | ICD-10-CM

## 2019-01-05 DIAGNOSIS — G35 Multiple sclerosis: Secondary | ICD-10-CM | POA: Diagnosis not present

## 2019-01-05 DIAGNOSIS — M5416 Radiculopathy, lumbar region: Secondary | ICD-10-CM | POA: Diagnosis not present

## 2019-01-05 DIAGNOSIS — R519 Headache, unspecified: Secondary | ICD-10-CM | POA: Diagnosis not present

## 2019-01-05 DIAGNOSIS — R5383 Other fatigue: Secondary | ICD-10-CM

## 2019-01-05 DIAGNOSIS — F988 Other specified behavioral and emotional disorders with onset usually occurring in childhood and adolescence: Secondary | ICD-10-CM | POA: Diagnosis not present

## 2019-01-05 DIAGNOSIS — M9903 Segmental and somatic dysfunction of lumbar region: Secondary | ICD-10-CM | POA: Diagnosis not present

## 2019-01-05 DIAGNOSIS — M9901 Segmental and somatic dysfunction of cervical region: Secondary | ICD-10-CM | POA: Diagnosis not present

## 2019-01-05 NOTE — Progress Notes (Signed)
[facility]  PATIENT: Carrie Nelson DOB: 08-10-1980  REFERRING CLINICIAN: Daniel Nones HISTORY FROM: Patient   REASON FOR VISIT: Multiple Sclerosis   HISTORICAL  CHIEF COMPLAINT:  Chief Complaint  Patient presents with  . Follow-up    RM 12, alone. last seen 07/02/2018  . Multiple Sclerosis    On Lemtrada.                                                f                                                                    HISTORY OF PRESENT ILLNESS:  Carrie Nelson is a 38 y.o. woman who was diagnosed with MS in 2007.    Update 01/05/2019: Her MS is doing very well with no new symptoms.  She is walking well and notes mild right leg weakness when she walks longer distances.    She denies numbness.,   Vision and bladder function are doing well.  She has fatigue and ADD.   Concerta has helped both issues.   Sh sleeps well most nights (with low dose alprazolam.  On topiramate has helped her migraines and they are now rare.     She did get diagnosed with hyperthyroidism after her Julaine Hua and had an iodine ablation.  She now has hypothyroidism and was started on synthroid and needs some adjustments.     Update 07/02/2018 (virtual) Her MS has done well and she denies any new MS symptoms or any exacerbations.  She completed a second course of Lemtrada November 2018.  She tolerated it very well and does have mild infusion reactions.  She is compliant with the monthly rems monitoring.  She was diagnosed with thyroid disorder treated with radioactive iodine and levothyroxine.  She has weakness and clumsiness in the right leg which is affecting her gait.  She stumbles some but has no falls.  Bladder function is fine.  Vision is doing well.  She notes fatigue that is improved by Concerta.  She tolerates it well.  She sleeps well most nights.  Mood has done well with fluoxetine.  She denies any current depression or anxiety.  She does not note any major cognitive issues.    She does have migraine headaches but they have done better on Topamax.  She is currently on 50 mg nightly and we discussed going up to 100 mg.  When the migraine occurs she will take ibuprofen.    Update 12/25/2017: She feels her MS has been stable.  She had her second course of Lemtrada in November 2018.  She tolerated it well with just mild infusion reactions.  She has been compliant with monthly REMS monitoring.  Of note, she did have hypothyroidism that has been treated with radioactive iodine and levothyroxine..   She feels stable neurologically.  Her gait is doing about the same as she has no falls.  Her right leg is mildly clumsy but not weak.  Bladder function is fine.  Vision is fine.  She notes some fatigue.  In the past she was on  Concerta with some benefit but discontinued when she was diagnosed with hypothyroidism.  Cognition is doing well.  Mood is doing well.    Her fatigue improved with Concerta.   It also helps her focus better.      She does have migraine headaches.  She is on Inderal LA 60 mg as an agent to help hyperthyroid symptoms. It helped migraine but not now.   It also makes her BP low (though not symptomatic).   She also takes Maxalt 10 mg as needed.  It often helps but she sometimes needs a second one and it makes her sleepy.   Migraines were very rare until recently.   She has migraines 1/week lasting several hours with intense pain but milder pain x 36 hours.       Update 06/24/2017: She had a second course of Lemtrada infusions November 2018.  She tolerated the infusions well with only mild .  Her MS has been stable she has not had any exacerbations.  She is compliant with monthly REMS monitoring.  She was recently diagnosed with hyperthyroidism and had 1 dose of radioactive iodine 5 weeks ago.  She feels gait is doing about the same.  The right leg is mildly clumsy compared to the left leg but she is not having any falls.  She notes no difficulties with her bladder.     Vision is fine.  She continues to have some fatigue both physical and mental.  Concerta was helping but she stopped when the hyperthyroidism was diagnosed.  .   She has mild difficulties with insomnia since the thyroid issues.   She denies cognitive issues.  She has no problems with mood.  Update 12/25/2016:    [facility] and will have her second 3 day course next month. She feels her MS has been very stable since starting [place]. Specifically there has not been any exacerbations. She had a few mild infusion reactions the week of the infusions but was fine by the next Monday. She feels she is stable neurologically with no new numbness or weakness. The right leg is mildly clumsier and slightly weaker than the left leg. She notes this mostly when she tries to move fast. She denies any difficulties with her bladder. There is no problems with vision.  She has fatigue, physical and mental.   Concerta helps best when she takes mid morning.    Most days, she just takes one dose in the mid morning.  By taking it later, she has not needed the second dose.  She sleeps well at night.    She denies cognitive issues.     She denies any significant depression or anxiety.   She is on low dose Prozac with benefit   From 08/02/2015  MS:   She had her first week of Lemtrada infusions November 13 through 01/13/2016. She will get 3 additional days in November 2018. She tolerated the infusions fairly well though she did have some chills and headaches. She also felt weaker the next week.   Fatigue continues to be her major problem.  Gait/strength/sensation:    Gait strength and sensation are generally doing well. She sometimes drags the right leg when she is more tired notices that the right leg is weak when she climbs stairs sometimes there will be mild dysesthesias in that leg but they are never troubling.   Bladder: She denies urinary frequency, urgency or hesitancy..Marland Kitchen  Vision:   She denies any MS related vision problem. e.     Fatigue/sleep/cognition:     She has more fatigue than last year.   Concerta 36 mg once or twice a day helps.  She usually takes the first dose in the morning and will take a second dose around noon some days. Besides MS fatigue, she notes less sleepiness in the afternoons.   She sleeps well at night.     She notes that her decreased focus is also better.    She denies memory problems.     Mood:   She denies any depression.   She notes mild anxiety helped by Prozac 10 mg   MS History:   She was diagnosed in 2007 but,  in retrospect, had right foot dragging that started around 2005. Then shortly after that she had an episode of left facial numbness. In 2007 she had an episode of slurred speech and worsening clumsiness.  MRI of the brain that was consistent with multiple sclerosis. She also had a lumbar puncture and her CSF compatible with the diagnosis of MS. Most of her symptoms improved over the next few months but she did not get 100% back to baseline and she continues to have minimal residual weakness, clumsiness and numbness. She was initially placed on Betaseron but switched to Tysabri because of some generalized side effects of Betaseron. She was seeing Dr. Tinnie GensJeffrey 2007 until 2014.   She was on Tysabri between 2008 and late 2014 when she stopped because of a desire to get pregnant. She became pregnant in early 2015 and delivered in November 2015. She had not been on any disease modifying therapy since late 2014 and then started Tecfidera 02/2014.Marland Kitchen.   She did not tolerate it well and [facility].     REVIEW OF SYSTEMS:  Constitutional: No fevers, chills, sweats, or change in appetite Eyes: No visual changes, double vision, eye pain Ear, nose and throat: No hearing loss, ear pain, nasal congestion, sore throat Cardiovascular: No chest pain, palpitations Respiratory:  No shortness of breath at rest or with exertion.   No  wheezes GastrointestinaI: No nausea, vomiting, diarrhea, abdominal pain, fecal incontinence Genitourinary:  No dysuria, urinary retention or frequency.  No nocturia. Musculoskeletal:  No neck pain, back pain Integumentary: No rash, pruritus, skin lesions Neurological: as above Psychiatric: No depression at this time.  No anxiety.   Occasionally she has irritability. Endocrine: She she is borderline diabetic. No palpitations, diaphoresis, change in appetite, change in weigh or increased thirst Hematologic/Lymphatic:  No anemia, purpura, petechiae. Allergic/Immunologic: She has seasonal allergies and is treated with several medications.  ALLERGIES: No Known Allergies  HOME MEDICATIONS: Outpatient Medications Prior to Visit  Medication Sig Dispense Refill  . Alemtuzumab (LEMTRADA) 12 MG/1.2ML SOLN Inject 12 mg into the vein.    Marland Kitchen. ALPRAZolam (XANAX) 0.5 MG tablet Take 1 tablet (0.5 mg total) by mouth at bedtime as needed for anxiety. 90 tablet 2  . aspirin EC 81 MG tablet Take 81 mg by mouth daily.     . cholecalciferol (VITAMIN D) 1000 UNITS tablet Take 2,000 Units by mouth daily.     Marland Kitchen. FLUoxetine (PROZAC) 20 MG capsule Pt is taking 20mg  daily 90 capsule 4  . Levothyroxine Sodium 175 MCG CAPS Take 137 mcg by mouth daily.     . methylphenidate (CONCERTA) 36 MG PO CR tablet TAKE 1 TO 2 TABLETS BY MOUTH DAILY AS NEEDED 90 tablet 0  . montelukast (SINGULAIR) 10  MG tablet Take 10 mg by mouth at bedtime.   3  . Multiple Vitamins-Minerals (MULTIVITAMIN PO) Take by mouth.    . Norethindrone Acetate-Ethinyl Estradiol (LARIN 1.5/30) 1.5-30 MG-MCG tablet Take 1 tablet by mouth daily. 63 tablet 5  . saccharomyces boulardii (FLORASTOR) 250 MG capsule Take by mouth.    . topiramate (TOPAMAX) 50 MG tablet One or two po nightly 180 tablet 3  . XIIDRA 5 % SOLN   6   No facility-administered medications prior to visit.     PAST MEDICAL HISTORY: Past Medical History:  Diagnosis Date  . Multiple  sclerosis ([place])   . Syncope   . Thyroid disease   . Tremor     PAST SURGICAL HISTORY: Past Surgical History:  Procedure Laterality Date  . BUNIONECTOMY    . FOOT SURGERY    . KNEE ARTHROSCOPY W/ ACL RECONSTRUCTION Left 2010    FAMILY HISTORY: Family History  Problem Relation Age of Onset  . Prostate cancer Father   . Diabetes Father   . Arthritis Other   . Dementia Other   . Hypertension Other   . Heart disease Other     SOCIAL HISTORY:  Social History   Socioeconomic History  . Marital status: Married    Spouse name: Not on file  . Number of children: o  . Years of education: Not on file  . Highest education level: Not on file  Occupational History  . Not on file  Social Needs  . Financial resource strain: Not on file  . Food insecurity    Worry: Not on file    Inability: Not on file  . Transportation needs    Medical: Not on file    Non-medical: Not on file  Tobacco Use  . Smoking status: Never Smoker  . Smokeless tobacco: Never Used  Substance and Sexual Activity  . Alcohol use: Yes    Comment: minimal weekly   . Drug use: No  . Sexual activity: Yes    Birth control/protection: Pill  Lifestyle  . Physical activity    Days per week: Not on file    Minutes per session: Not on file  . Stress: Not on file  Relationships  . Social Herbalist on phone: Not on file    Gets together: Not on file    Attends religious service: Not on file    Active member of club or organization: Not on file    Attends meetings of clubs or organizations: Not on file    Relationship status: Not on file  . Intimate partner violence    Fear of current or ex partner: Not on file    Emotionally abused: Not on file    Physically abused: Not on file    Forced sexual activity: Not on file  Other Topics Concern  . Not on file  Social History Narrative   Patient lives with parents.   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    Patient is single.    Patient has a Haematologist.      PHYSICAL EXAM  Vitals:   01/05/19 1300  BP: 125/77  Pulse: 88  Temp: 98.2 F (36.8 C)  Weight: 150 lb (68 kg)  Height: 5\' 2"  (1.575 m)    Body mass index is 27.44 kg/m.   General: The patient is well-developed and well-nourished and in no acute distress   Neurologic Exam  Mental status: The patient is  alert and oriented x 3 at the time of the examination. The patient has apparent normal recent and remote memory, with an apparently normal attention span and concentration ability.   Speech is normal.  Cranial nerves:   Extraocular muscles are intact.  Facial strength and sensation is normal.  Trapezius strength is normal.  Hearing is normal symmetric  Motor:  Muscle bulk and tone are normal. Strength is  5 / 5 in all 4 extremities.   Sensory: She has intact sensation to touch and vibration  Coordination: She has good finger-nose-finger and heel-to-shin bilaterally.  Gait and station: Station and gait are normal.  Tandem gait is slightly wide.    Reflexes: Deep tendon reflexes are symmetric and normal in arms. Mildly increased right knee DTR.         ASSESSMENT AND PLAN  Multiple sclerosis (HCC)  Other fatigue  Attention deficit disorder, unspecified hyperactivity presence  Common migraine without intractability  Hyperthyroidism   1.   She will continue with the Lemtrada rems blood work and urinalysis on a monthly basis.  We went over signs and symptoms of ITP.  We discussed getting an MRI brain and cervical spine but she would like to defer into next year.    2.  Continue Concerta for fatigue. 3.  Increase Topamax to 100 mg nightly. 4.  Advised to get Covid 19 vaccination when available.  5.  Return to see me in 6 months or sooner if there are new or worsening neurologic symptoms.   A. Epimenio Foot, MD, PhD 01/05/2019, 1:30 PM Certified in Neurology, Clinical Neurophysiology, Sleep Medicine, Pain  Medicine and Neuroimaging  [facility] [address] [place], [place] [address] [phone]-

## 2019-01-06 ENCOUNTER — Telehealth: Payer: Self-pay | Admitting: *Deleted

## 2019-01-06 LAB — URINALYSIS, ROUTINE W REFLEX MICROSCOPIC
Bilirubin, UA: NEGATIVE
Glucose, UA: NEGATIVE
Leukocytes,UA: NEGATIVE
Nitrite, UA: NEGATIVE
Protein,UA: NEGATIVE
RBC, UA: NEGATIVE
Specific Gravity, UA: 1.024 (ref 1.005–1.030)
Urobilinogen, Ur: 1 mg/dL (ref 0.2–1.0)
pH, UA: 7 (ref 5.0–7.5)

## 2019-01-06 LAB — CBC WITH DIFFERENTIAL/PLATELET
Basophils Absolute: 0.1 10*3/uL (ref 0.0–0.2)
Basos: 1 %
EOS (ABSOLUTE): 0.1 10*3/uL (ref 0.0–0.4)
Eos: 2 %
Hematocrit: 39 % (ref 34.0–46.6)
Hemoglobin: 13.1 g/dL (ref 11.1–15.9)
Immature Grans (Abs): 0 10*3/uL (ref 0.0–0.1)
Immature Granulocytes: 0 %
Lymphocytes Absolute: 1.7 10*3/uL (ref 0.7–3.1)
Lymphs: 34 %
MCH: 31 pg (ref 26.6–33.0)
MCHC: 33.6 g/dL (ref 31.5–35.7)
MCV: 92 fL (ref 79–97)
Monocytes Absolute: 0.6 10*3/uL (ref 0.1–0.9)
Monocytes: 12 %
Neutrophils Absolute: 2.5 10*3/uL (ref 1.4–7.0)
Neutrophils: 51 %
Platelets: 345 10*3/uL (ref 150–450)
RBC: 4.22 x10E6/uL (ref 3.77–5.28)
RDW: 11.9 % (ref 11.7–15.4)
WBC: 5 10*3/uL (ref 3.4–10.8)

## 2019-01-06 LAB — COMPREHENSIVE METABOLIC PANEL
ALT: 9 IU/L (ref 0–32)
AST: 14 IU/L (ref 0–40)
Albumin/Globulin Ratio: 2 (ref 1.2–2.2)
Albumin: 4.1 g/dL (ref 3.8–4.8)
Alkaline Phosphatase: 51 IU/L (ref 39–117)
BUN/Creatinine Ratio: 15 (ref 9–23)
BUN: 16 mg/dL (ref 6–20)
Bilirubin Total: 0.3 mg/dL (ref 0.0–1.2)
CO2: 19 mmol/L — ABNORMAL LOW (ref 20–29)
Calcium: 8.8 mg/dL (ref 8.7–10.2)
Chloride: 110 mmol/L — ABNORMAL HIGH (ref 96–106)
Creatinine, Ser: 1.09 mg/dL — ABNORMAL HIGH (ref 0.57–1.00)
GFR calc Af Amer: 74 mL/min/{1.73_m2} (ref >59)
GFR calc non Af Amer: 64 mL/min/{1.73_m2} (ref >59)
Globulin, Total: 2.1 g/dL (ref 1.5–4.5)
Glucose: 107 mg/dL — ABNORMAL HIGH (ref 65–99)
Potassium: 4 mmol/L (ref 3.5–5.2)
Sodium: 141 mmol/L (ref 134–144)
Total Protein: 6.2 g/dL (ref 6.0–8.5)

## 2019-01-06 LAB — TSH: TSH: 1.26 u[IU]/mL (ref 0.450–4.500)

## 2019-01-06 NOTE — Telephone Encounter (Signed)
Spoke with patient and informed her that per Dr Felecia Shelling, her labs look fine. Patient verbalized understanding, appreciation.

## 2019-01-30 DIAGNOSIS — E890 Postprocedural hypothyroidism: Secondary | ICD-10-CM | POA: Diagnosis not present

## 2019-02-03 DIAGNOSIS — M9903 Segmental and somatic dysfunction of lumbar region: Secondary | ICD-10-CM | POA: Diagnosis not present

## 2019-02-03 DIAGNOSIS — M5416 Radiculopathy, lumbar region: Secondary | ICD-10-CM | POA: Diagnosis not present

## 2019-02-03 DIAGNOSIS — R519 Headache, unspecified: Secondary | ICD-10-CM | POA: Diagnosis not present

## 2019-02-03 DIAGNOSIS — M9901 Segmental and somatic dysfunction of cervical region: Secondary | ICD-10-CM | POA: Diagnosis not present

## 2019-02-06 DIAGNOSIS — E890 Postprocedural hypothyroidism: Secondary | ICD-10-CM | POA: Diagnosis not present

## 2019-02-12 ENCOUNTER — Other Ambulatory Visit: Payer: Self-pay | Admitting: Neurology

## 2019-02-18 DIAGNOSIS — K219 Gastro-esophageal reflux disease without esophagitis: Secondary | ICD-10-CM | POA: Diagnosis not present

## 2019-02-18 LAB — HM DIABETES EYE EXAM

## 2019-03-03 DIAGNOSIS — R519 Headache, unspecified: Secondary | ICD-10-CM | POA: Diagnosis not present

## 2019-03-03 DIAGNOSIS — M9903 Segmental and somatic dysfunction of lumbar region: Secondary | ICD-10-CM | POA: Diagnosis not present

## 2019-03-03 DIAGNOSIS — M9901 Segmental and somatic dysfunction of cervical region: Secondary | ICD-10-CM | POA: Diagnosis not present

## 2019-03-03 DIAGNOSIS — M5416 Radiculopathy, lumbar region: Secondary | ICD-10-CM | POA: Diagnosis not present

## 2019-03-05 ENCOUNTER — Telehealth: Payer: 59 | Admitting: Physician Assistant

## 2019-03-05 DIAGNOSIS — R300 Dysuria: Secondary | ICD-10-CM

## 2019-03-05 MED ORDER — CEPHALEXIN 500 MG PO CAPS: 500.0000 mg | ORAL_CAPSULE | Freq: Two times a day (BID) | ORAL | 0 refills | Status: AC

## 2019-03-05 NOTE — Progress Notes (Signed)
I have spent 5 minutes in review of e-visit questionnaire, review and updating patient chart, medical decision making and response to patient.   Derrall Hicks Cody Rashon Westrup, PA-C    

## 2019-03-05 NOTE — Progress Notes (Signed)

## 2019-03-16 ENCOUNTER — Encounter: Payer: Self-pay | Admitting: Internal Medicine

## 2019-03-19 ENCOUNTER — Other Ambulatory Visit: Payer: Self-pay

## 2019-03-19 ENCOUNTER — Other Ambulatory Visit: Payer: Self-pay | Admitting: Neurology

## 2019-03-19 DIAGNOSIS — Z1231 Encounter for screening mammogram for malignant neoplasm of breast: Secondary | ICD-10-CM

## 2019-03-31 DIAGNOSIS — M9903 Segmental and somatic dysfunction of lumbar region: Secondary | ICD-10-CM | POA: Diagnosis not present

## 2019-03-31 DIAGNOSIS — M9901 Segmental and somatic dysfunction of cervical region: Secondary | ICD-10-CM | POA: Diagnosis not present

## 2019-03-31 DIAGNOSIS — R519 Headache, unspecified: Secondary | ICD-10-CM | POA: Diagnosis not present

## 2019-03-31 DIAGNOSIS — M5416 Radiculopathy, lumbar region: Secondary | ICD-10-CM | POA: Diagnosis not present

## 2019-04-10 ENCOUNTER — Ambulatory Visit
Admission: RE | Admit: 2019-04-10 | Discharge: 2019-04-10 | Disposition: A | Discharge status: Home / Self Care | Payer: 59 | Source: Ambulatory Visit | Attending: Obstetrics and Gynecology | Admitting: Obstetrics and Gynecology

## 2019-04-10 DIAGNOSIS — Z1231 Encounter for screening mammogram for malignant neoplasm of breast: Secondary | ICD-10-CM | POA: Diagnosis not present

## 2019-04-20 ENCOUNTER — Encounter: Payer: Self-pay | Admitting: *Deleted

## 2019-04-20 ENCOUNTER — Encounter: Payer: Self-pay | Admitting: Neurology

## 2019-04-21 ENCOUNTER — Encounter: Payer: Self-pay | Admitting: *Deleted

## 2019-04-21 ENCOUNTER — Encounter: Payer: Self-pay | Admitting: Internal Medicine

## 2019-04-21 ENCOUNTER — Ambulatory Visit (INDEPENDENT_AMBULATORY_CARE_PROVIDER_SITE_OTHER): Payer: 59 | Admitting: Internal Medicine

## 2019-04-21 ENCOUNTER — Telehealth: Payer: Self-pay | Admitting: Internal Medicine

## 2019-04-21 ENCOUNTER — Other Ambulatory Visit: Payer: Self-pay

## 2019-04-21 VITALS — BP 124/70 | HR 68 | Temp 98.5°F | Ht 62.5 in | Wt 153.0 lb

## 2019-04-21 DIAGNOSIS — R0989 Other specified symptoms and signs involving the circulatory and respiratory systems: Secondary | ICD-10-CM

## 2019-04-21 DIAGNOSIS — R09A2 Foreign body sensation, throat: Secondary | ICD-10-CM

## 2019-04-21 DIAGNOSIS — K219 Gastro-esophageal reflux disease without esophagitis: Secondary | ICD-10-CM | POA: Diagnosis not present

## 2019-04-21 DIAGNOSIS — R6889 Other general symptoms and signs: Secondary | ICD-10-CM

## 2019-04-21 NOTE — Telephone Encounter (Signed)
Noted  

## 2019-04-21 NOTE — Progress Notes (Signed)
Patient ID: Carrie Nelson, female   DOB: 03-21-1980, 39 y.o.   MRN: [id number] HPI: Carrie Nelson is a 39 year old female with a history of multiple sclerosis, thyroid disease, who is seen to evaluate LPR symptoms and to consider GERD.  She is here alone today.  She reports that she was seen by ENT Dr. Willeen Cass to evaluate throat clearing and globus sensation.  She was told that this was very likely GERD and placed on Nexium 40 mg daily.  She has been using Nexium 40 mg 30 minutes to 1 hour before her evening meal.  She reports that she has had very troublesome globus and throat clearing symptom over the last several years.  This is much worse in the evening and almost absent in the morning.  It is also specifically worse with fatty foods and she notes that pizza is a "disaster".  Almost immediately after eating such foods she will have throat clearing and worsening globus sensation.  There is no choking or dysphagia symptom.  There is no heartburn.  She does not have belching or sour brash.  Nexium if it has helped at all has helped only slightly.  Again the symptoms does not seem to bother her in the morning when she drinks coffee and eats breakfast.  She does not wake up with coughing or choking or dyspnea.  She does not have abdominal pain, change in bowel habit, blood in her stool or melena.  She does take Lemtrada for MS under the direction of Dr. Epimenio Foot at [facility] neurology.  Her MS is currently under good control.  She takes Concerta for fatigue associated with her MS and Topamax for migraines.  She is married with one 13-year-old son.  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  No tobacco use.  No alcohol.  Past Medical History:  Diagnosis Date  . GERD (gastroesophageal reflux disease)   . Hypothyroidism   . Multiple sclerosis (HCC)   . Syncope   . Thyroid disease   . Thyrotoxicosis   . Tremor     Past Surgical History:  Procedure Laterality Date  . BUNIONECTOMY    . FOOT  SURGERY    . KNEE ARTHROSCOPY W/ ACL RECONSTRUCTION Left 2010    Outpatient Medications Prior to Visit  Medication Sig Dispense Refill  . Alemtuzumab (LEMTRADA) 12 MG/1.2ML SOLN Inject 12 mg into the vein.    Marland Kitchen ALPRAZolam (XANAX) 0.5 MG tablet Take 1 tablet (0.5 mg total) by mouth at bedtime as needed for anxiety. 90 tablet 2  . aspirin EC 81 MG tablet Take 81 mg by mouth daily.     . cholecalciferol (VITAMIN D) 1000 UNITS tablet Take 2,000 Units by mouth daily.     Marland Kitchen FLUoxetine (PROZAC) 20 MG capsule Pt is taking 20mg  daily 90 capsule 4  . Levothyroxine Sodium 175 MCG CAPS Take 137 mcg by mouth daily.     . methylphenidate (CONCERTA) 36 MG PO CR tablet TAKE 1 TO 2 TABLETS BY MOUTH DAILY AS NEEDED 90 tablet 0  . montelukast (SINGULAIR) 10 MG tablet Take 10 mg by mouth at bedtime.   3  . Multiple Vitamins-Minerals (MULTIVITAMIN PO) Take by mouth.    . Norethindrone Acetate-Ethinyl Estradiol (LARIN 1.5/30) 1.5-30 MG-MCG tablet Take 1 tablet by mouth daily. 63 tablet 5  . saccharomyces boulardii (FLORASTOR) 250 MG capsule Take by mouth.    . topiramate (TOPAMAX) 50 MG tablet One or two po nightly 180 tablet 3  . 12-[birth year]1977  5 % SOLN   6   No facility-administered medications prior to visit.    No Known Allergies  Family History  Problem Relation Age of Onset  . Prostate cancer Father   . Diabetes Father   . Arthritis Other   . Dementia Other   . Hypertension Other   . Heart disease Other     Social History   Tobacco Use  . Smoking status: Never Smoker  . Smokeless tobacco: Never Used  Substance Use Topics  . Alcohol use: Yes    Comment: minimal weekly   . Drug use: No    ROS: As per history of present illness, otherwise negative  BP 124/70   Pulse 68   Temp 98.5 F (36.9 C)   Ht 5' 2.5" (1.588 m)   Wt 153 lb (69.4 kg)   BMI 27.54 kg/m  Gen: awake, alert, NAD HEENT: anicteric, op clear CV: RRR, no mrg Pulm: CTA b/l Abd: soft, NT/ND, +BS throughout Ext: no  c/c/e Neuro: nonfocal   RELEVANT LABS AND IMAGING: CBC    Component Value Date/Time   WBC 5.0 01/05/2019 1455   WBC 10.1 01/22/2014 1723   RBC 4.22 01/05/2019 1455   RBC 3.71 (L) 01/22/2014 1723   HGB 13.1 01/05/2019 1455   HCT 39.0 01/05/2019 1455   PLT 345 01/05/2019 1455   MCV 92 01/05/2019 1455   MCV 92 01/22/2014 1723   MCH 31.0 01/05/2019 1455   MCH 31.0 01/22/2014 1723   MCHC 33.6 01/05/2019 1455   MCHC 33.7 01/22/2014 1723   RDW 11.9 01/05/2019 1455   RDW 13.0 01/22/2014 1723   LYMPHSABS 1.7 01/05/2019 1455   LYMPHSABS 2.1 01/22/2014 1723   MONOABS 0.8 01/22/2014 1723   EOSABS 0.1 01/05/2019 1455   EOSABS 0.1 01/22/2014 1723   BASOSABS 0.1 01/05/2019 1455   BASOSABS 0.1 01/22/2014 1723    CMP     Component Value Date/Time   NA 141 01/05/2019 1455   NA 138 05/09/2012 0830   K 4.0 01/05/2019 1455   K 3.7 05/09/2012 0830   CL 110 (H) 01/05/2019 1455   CL 108 (H) 05/09/2012 0830   CO2 19 (L) 01/05/2019 1455   CO2 23 05/09/2012 0830   GLUCOSE 107 (H) 01/05/2019 1455   GLUCOSE 98 08/02/2017 1650   GLUCOSE 84 05/09/2012 0830   BUN 16 01/05/2019 1455   BUN 16 05/09/2012 0830   CREATININE 1.09 (H) 01/05/2019 1455   CREATININE 0.95 05/09/2012 0830   CALCIUM 8.8 01/05/2019 1455   CALCIUM 8.6 05/09/2012 0830   PROT 6.2 01/05/2019 1455   PROT 6.7 05/09/2012 0830   ALBUMIN 4.1 01/05/2019 1455   ALBUMIN 3.4 05/09/2012 0830   AST 14 01/05/2019 1455   AST 17 05/09/2012 0830   ALT 9 01/05/2019 1455   ALT 16 05/09/2012 0830   ALKPHOS 51 01/05/2019 1455   ALKPHOS 55 05/09/2012 0830   BILITOT 0.3 01/05/2019 1455   BILITOT 0.7 05/09/2012 0830   GFRNONAA 64 01/05/2019 1455   GFRNONAA >60 05/09/2012 0830   GFRAA 74 01/05/2019 1455   GFRAA >60 05/09/2012 0830    ASSESSMENT/PLAN: 39 year old female with a history of multiple sclerosis, thyroid disease, who is seen to evaluate LPR symptoms and to consider GERD.   1. LPR/throat clearing/globus sensation/? GERD  --we discussed her symptoms at length today.  She has not benefited from Nexium 40 mg taken before dinner.  She still has frequent throat clearing and globus sensation.  It is  difficult for me to know if this truly is gastroesophageal reflux disease but certainly it needs to be excluded.  It has not responded to a prescription strength PPI.  Other considerations include some sort of allergic type response.  I recommended that we evaluate this with upper endoscopy and consider pH and impedance testing depending on the results of the EGD --Move Nexium to 40 mg 30 minutes to 1 hour before breakfast rather than dinner to see if this is more effective particularly in the evenings --Upper endoscopy in the [place]; risk, benefits and alternatives discussed and she is agreeable and wishes to proceed --Depending on upper endoscopy results consider 24-hour pH and impedance testing likely on PPI therapy but will make this decision after upper endoscopy  45 minutes total spent today including patient facing time, coordination of care, reviewing medical history/procedures/pertinent radiology studies, and documentation of the encounter.     TI:RWERX, Tonette Bihari, Md [facility] [place],  [place] [address]

## 2019-04-21 NOTE — Patient Instructions (Signed)
You have been scheduled for an endoscopy. Please follow written instructions given to you at your visit today. If you use inhalers (even only as needed), please bring them with you on the day of your procedure. Your physician has requested that you go to www.startemmi.com and enter the access code given to you at your visit today. This web site gives a general overview about your procedure. However, you should still follow specific instructions given to you by our office regarding your preparation for the procedure. ______________________________________________________ Change your Nexium to morning dosing. ______________________________________________________ If you are age 32 or older, your body mass index should be between 23-30. Your Body mass index is 27.54 kg/m. If this is out of the aforementioned range listed, please consider follow up with your Primary Care Provider.  If you are age 32 or younger, your body mass index should be between 19-25. Your Body mass index is 27.54 kg/m. If this is out of the aformentioned range listed, please consider follow up with your Primary Care Provider.  ________________________________________________________ Due to recent changes in healthcare laws, you may see the results of your imaging and laboratory studies on MyChart before your provider has had a chance to review them.  We understand that in some cases there may be results that are confusing or concerning to you. Not all laboratory results come back in the same time frame and the provider may be waiting for multiple results in order to interpret others.  Please give [place] 48 hours in order for your provider to thoroughly review all the results before contacting the office for clarification of your results.

## 2019-04-22 ENCOUNTER — Encounter: Payer: Self-pay | Admitting: Internal Medicine

## 2019-04-28 DIAGNOSIS — M5416 Radiculopathy, lumbar region: Secondary | ICD-10-CM | POA: Diagnosis not present

## 2019-04-28 DIAGNOSIS — M9903 Segmental and somatic dysfunction of lumbar region: Secondary | ICD-10-CM | POA: Diagnosis not present

## 2019-04-28 DIAGNOSIS — R519 Headache, unspecified: Secondary | ICD-10-CM | POA: Diagnosis not present

## 2019-04-28 DIAGNOSIS — M9901 Segmental and somatic dysfunction of cervical region: Secondary | ICD-10-CM | POA: Diagnosis not present

## 2019-04-29 ENCOUNTER — Encounter: Payer: 59 | Admitting: Internal Medicine

## 2019-05-05 ENCOUNTER — Encounter: Payer: 59 | Admitting: Internal Medicine

## 2019-05-05 ENCOUNTER — Telehealth: Payer: Self-pay | Admitting: *Deleted

## 2019-05-05 NOTE — Telephone Encounter (Signed)
Called and LVM for pt letting her know Dr. Epimenio Foot reviewed labs drawn 04/20/19 and it shows she might have a UTI. Wanted to know if she is having any sx. If she is, he wants to call in Bactrim DS #14 to take twice daily for a week. If sx do not clear up after this, he would like to check a urine culture. Asked she call us back to let us know

## 2019-05-07 ENCOUNTER — Encounter: Payer: Self-pay | Admitting: Internal Medicine

## 2019-05-07 ENCOUNTER — Other Ambulatory Visit: Payer: Self-pay

## 2019-05-07 ENCOUNTER — Ambulatory Visit (AMBULATORY_SURGERY_CENTER): Payer: 59 | Admitting: Internal Medicine

## 2019-05-07 VITALS — BP 114/75 | HR 76 | Temp 97.1°F | Resp 21 | Ht 62.0 in | Wt 153.0 lb

## 2019-05-07 DIAGNOSIS — K219 Gastro-esophageal reflux disease without esophagitis: Secondary | ICD-10-CM

## 2019-05-07 DIAGNOSIS — K3189 Other diseases of stomach and duodenum: Secondary | ICD-10-CM | POA: Diagnosis not present

## 2019-05-07 DIAGNOSIS — K228 Other specified diseases of esophagus: Secondary | ICD-10-CM | POA: Diagnosis not present

## 2019-05-07 HISTORY — PX: UPPER GASTROINTESTINAL ENDOSCOPY: SHX188

## 2019-05-07 MED ORDER — SODIUM CHLORIDE 0.9 % IV SOLN: 500.0000 mL | Freq: Once | INTRAVENOUS | Status: DC

## 2019-05-07 NOTE — Progress Notes (Signed)
To PACU, VSS. Report to [email or web address] 

## 2019-05-07 NOTE — Patient Instructions (Signed)
Await pathology results.  May proceed to manometry with 24 hr ph and impedence and consider TIF depending on results.  Dr. Lauro Franklin office will be in touch.  YOU HAD AN ENDOSCOPIC PROCEDURE TODAY AT THE [facility]:   Refer to the procedure report that was given to you for any specific questions about what was found during the examination.  If the procedure report does not answer your questions, please call your gastroenterologist to clarify.  If you requested that your care partner not be given the details of your procedure findings, then the procedure report has been included in a sealed envelope for you to review at your convenience later.  YOU SHOULD EXPECT: Some feelings of bloating in the abdomen. Passage of more gas than usual.  Walking can help get rid of the air that was put into your GI tract during the procedure and reduce the bloating. If you had a lower endoscopy (such as a colonoscopy or flexible sigmoidoscopy) you may notice spotting of blood in your stool or on the toilet paper. If you underwent a bowel prep for your procedure, you may not have a normal bowel movement for a few days.  Please Note:  You might notice some irritation and congestion in your nose or some drainage.  This is from the oxygen used during your procedure.  There is no need for concern and it should clear up in a day or so.  SYMPTOMS TO REPORT IMMEDIATELY:   Following upper endoscopy (EGD)  Vomiting of blood or coffee ground material  New chest pain or pain under the shoulder blades  Painful or persistently difficult swallowing  New shortness of breath  Fever of 100F or higher  Black, tarry-looking stools  For urgent or emergent issues, a gastroenterologist can be reached at any hour by calling [phone]. Do not use MyChart messaging for urgent concerns.    DIET:  We do recommend a small meal at first, but then you may proceed to your regular diet.  Drink plenty of fluids but you  should avoid alcoholic beverages for 24 hours.  ACTIVITY:  You should plan to take it easy for the rest of today and you should NOT DRIVE or use heavy machinery until tomorrow (because of the sedation medicines used during the test).    FOLLOW UP: Our staff will call the number listed on your records 48-72 hours following your procedure to check on you and address any questions or concerns that you may have regarding the information given to you following your procedure. If we do not reach you, we will leave a message.  We will attempt to reach you two times.  During this call, we will ask if you have developed any symptoms of COVID 19. If you develop any symptoms (ie: fever, flu-like symptoms, shortness of breath, cough etc.) before then, please call [phone].  If you test positive for Covid 19 in the 2 weeks post procedure, please call and report this information to [place].    If any biopsies were taken you will be contacted by phone or by letter within the next 1-3 weeks.  Please call us at [phone] if you have not heard about the biopsies in 3 weeks.    SIGNATURES/CONFIDENTIALITY: You and/or your care partner have signed paperwork which will be entered into your electronic medical record.  These signatures attest to the fact that that the information above on your After Visit Summary has been reviewed and is understood.  Full responsibility of the confidentiality of this discharge information lies with you and/or your care-partner.

## 2019-05-07 NOTE — Op Note (Signed)
[facility] Patient Name: Carrie Nelson Procedure Date: 05/07/2019 10:29 AM MRN: [id number] Endoscopist: Beverley Fiedler , MD Age: 39 Referring MD:  Date of Birth: [birth year] Gender: Female Account #: [id number] Procedure:                Upper GI endoscopy Indications:              Globus sensation, throating clearing, LPR symptoms,                            question of GERD Medicines:                Monitored Anesthesia Care Procedure:                Pre-Anesthesia Assessment:                           - Prior to the procedure, a History and Physical                            was performed, and patient medications and                            allergies were reviewed. The patient's tolerance of                            previous anesthesia was also reviewed. The risks                            and benefits of the procedure and the sedation                            options and risks were discussed with the patient.                            All questions were answered, and informed consent                            was obtained. Prior Anticoagulants: The patient has                            taken no previous anticoagulant or antiplatelet                            agents. ASA Grade Assessment: II - A patient with                            mild systemic disease. After reviewing the risks                            and benefits, the patient was deemed in                            satisfactory condition to undergo the procedure.  After obtaining informed consent, the endoscope was                            passed under direct vision. Throughout the                            procedure, the patient's blood pressure, pulse, and                            oxygen saturations were monitored continuously. The                            Endoscope was introduced through the mouth, and                            advanced to the second part of  duodenum. The upper                            GI endoscopy was accomplished without difficulty.                            The patient tolerated the procedure well. Scope In: Scope Out: Findings:                 Normal mucosa was found in the entire esophagus.                            Biopsies were taken with a cold forceps for                            histology to objectively evaluate for reflux                            changes. No evidence of Barrett's esophagus.                           The gastroesophageal flap valve was visualized                            endoscopically and classified as Hill Grade III                            (minimal fold, loose to endoscope, hiatal hernia                            likely). The GE junction is patulous. If hiatal                            hernia is present it is very small (1 cm).                           The entire examined stomach was normal.  The examined duodenum was normal. Complications:            No immediate complications. Estimated Blood Loss:     Estimated blood loss was minimal. Impression:               - Normal mucosa was found in the entire esophagus.                            Biopsied.                           - Gastroesophageal flap valve classified as Hill                            Grade III (minimal fold, loose to endoscope, hiatal                            hernia likely).                           - Normal stomach.                           - Normal examined duodenum. Recommendation:           - Patient has a contact number available for                            emergencies. The signs and symptoms of potential                            delayed complications were discussed with the                            patient. Return to normal activities tomorrow.                            Written discharge instructions were provided to the                            patient.                            - Resume previous diet.                           - Continue present medications.                           - Await pathology results.                           - May proceed with manometry with 24 hr pH and                            impedence and consideration of TIF (depending on  results). Beverley Fiedler, MD 05/07/2019 10:49:07 AM This report has been signed electronically.

## 2019-05-07 NOTE — Telephone Encounter (Signed)
Called and spoke with pt. She received message. She is not having any UTI sx. She will continue to monitor and let us know if anything changes. Nothing further needed.

## 2019-05-07 NOTE — Progress Notes (Signed)
Called to room to assist during endoscopic procedure.  Patient ID and intended procedure confirmed with present staff. Received instructions for my participation in the procedure from the performing physician.  

## 2019-05-07 NOTE — Progress Notes (Signed)
Temp check by:JB Vital check by:DT  The medical and surgical history was reviewed and verified with the patient. 

## 2019-05-11 ENCOUNTER — Telehealth: Payer: Self-pay

## 2019-05-11 NOTE — Telephone Encounter (Signed)
  Follow up Call-  Call back number 05/07/2019  Post procedure Call Back phone  # [phone]  Permission to leave phone message Yes  Some recent data might be hidden     Left message

## 2019-05-11 NOTE — Telephone Encounter (Signed)
  Follow up Call-  Call back number 05/07/2019  Post procedure Call Back phone  # [phone]  Permission to leave phone message Yes  Some recent data might be hidden     Patient questions:  Do you have a fever, pain , or abdominal swelling? No. Pain Score  0 *  Have you tolerated food without any problems? Yes.    Have you been able to return to your normal activities? Yes.    Do you have any questions about your discharge instructions: Diet   No. Medications  No. Follow up visit  No.  Do you have questions or concerns about your Care? No.  Actions: * If pain score is 4 or above: 1. No action needed, pain <4.Have you developed a fever since your procedure? no  2.   Have you had an respiratory symptoms (SOB or cough) since your procedure? no  3.   Have you tested positive for COVID 19 since your procedure no  4.   Have you had any family members/close contacts diagnosed with the COVID 19 since your procedure?  no   If yes to any of these questions please route to Laverna Peace, RN and Jennye Boroughs, Charity fundraiser.

## 2019-05-18 ENCOUNTER — Other Ambulatory Visit: Payer: Self-pay | Admitting: Neurology

## 2019-05-22 ENCOUNTER — Other Ambulatory Visit: Payer: Self-pay

## 2019-05-22 DIAGNOSIS — R0989 Other specified symptoms and signs involving the circulatory and respiratory systems: Secondary | ICD-10-CM

## 2019-05-22 DIAGNOSIS — K219 Gastro-esophageal reflux disease without esophagitis: Secondary | ICD-10-CM

## 2019-05-27 ENCOUNTER — Other Ambulatory Visit: Payer: Self-pay | Admitting: Obstetrics and Gynecology

## 2019-05-27 NOTE — Telephone Encounter (Signed)
Patient is going to contact her PCP. 

## 2019-06-05 DIAGNOSIS — E890 Postprocedural hypothyroidism: Secondary | ICD-10-CM | POA: Diagnosis not present

## 2019-06-09 DIAGNOSIS — M5416 Radiculopathy, lumbar region: Secondary | ICD-10-CM | POA: Diagnosis not present

## 2019-06-09 DIAGNOSIS — R519 Headache, unspecified: Secondary | ICD-10-CM | POA: Diagnosis not present

## 2019-06-09 DIAGNOSIS — M9903 Segmental and somatic dysfunction of lumbar region: Secondary | ICD-10-CM | POA: Diagnosis not present

## 2019-06-09 DIAGNOSIS — M9901 Segmental and somatic dysfunction of cervical region: Secondary | ICD-10-CM | POA: Diagnosis not present

## 2019-06-12 DIAGNOSIS — E890 Postprocedural hypothyroidism: Secondary | ICD-10-CM | POA: Diagnosis not present

## 2019-06-20 ENCOUNTER — Other Ambulatory Visit (HOSPITAL_COMMUNITY)
Admission: RE | Admit: 2019-06-20 | Discharge: 2019-06-20 | Disposition: A | Discharge status: Home / Self Care | Payer: 59 | Source: Ambulatory Visit | Attending: Gastroenterology | Admitting: Gastroenterology

## 2019-06-20 DIAGNOSIS — Z20822 Contact with and (suspected) exposure to covid-19: Secondary | ICD-10-CM | POA: Insufficient documentation

## 2019-06-20 DIAGNOSIS — Z01812 Encounter for preprocedural laboratory examination: Secondary | ICD-10-CM | POA: Diagnosis not present

## 2019-06-20 LAB — SARS CORONAVIRUS 2 (TAT 6-24 HRS): SARS Coronavirus 2: NEGATIVE

## 2019-06-24 ENCOUNTER — Ambulatory Visit (HOSPITAL_COMMUNITY)
Admission: RE | Admit: 2019-06-24 | Discharge: 2019-06-24 | Disposition: A | Discharge status: Home / Self Care | Payer: 59 | Attending: Gastroenterology | Admitting: Gastroenterology

## 2019-06-24 ENCOUNTER — Encounter (HOSPITAL_COMMUNITY)
Admission: RE | Disposition: A | Discharge status: Home / Self Care | Payer: Self-pay | Source: Home / Self Care | Attending: Gastroenterology

## 2019-06-24 DIAGNOSIS — K219 Gastro-esophageal reflux disease without esophagitis: Secondary | ICD-10-CM | POA: Diagnosis not present

## 2019-06-24 DIAGNOSIS — R0989 Other specified symptoms and signs involving the circulatory and respiratory systems: Secondary | ICD-10-CM | POA: Diagnosis not present

## 2019-06-24 DIAGNOSIS — F458 Other somatoform disorders: Secondary | ICD-10-CM | POA: Insufficient documentation

## 2019-06-24 HISTORY — PX: ESOPHAGEAL MANOMETRY: SHX5429

## 2019-06-24 HISTORY — PX: PH IMPEDANCE STUDY: SHX5565

## 2019-06-24 HISTORY — PX: 24 HOUR PH STUDY: SHX5419

## 2019-06-24 SURGERY — MANOMETRY, ESOPHAGUS

## 2019-06-24 MED ORDER — LIDOCAINE VISCOUS HCL 2 % MT SOLN
OROMUCOSAL | Status: AC
  Filled 2019-06-24: qty 15

## 2019-06-24 SURGICAL SUPPLY — 2 items
FACESHIELD LNG OPTICON STERILE (SAFETY) IMPLANT
GLOVE BIO SURGEON STRL SZ8 (GLOVE) ×4 IMPLANT

## 2019-06-24 NOTE — Progress Notes (Signed)
Esophageal manometry done per protocol. Pt tolerated well without distress of complication. Ph probe placed at 35 per protocol. Pt tolerated well. Education done using teach back regarding study and monitor. Pt will return in 24 hours to have probe removed and download study. Pt voiced understanding and questions answered.

## 2019-06-25 ENCOUNTER — Encounter: Payer: Self-pay | Admitting: *Deleted

## 2019-06-30 DIAGNOSIS — R0989 Other specified symptoms and signs involving the circulatory and respiratory systems: Secondary | ICD-10-CM

## 2019-06-30 DIAGNOSIS — K219 Gastro-esophageal reflux disease without esophagitis: Secondary | ICD-10-CM

## 2019-07-03 ENCOUNTER — Other Ambulatory Visit: Payer: Self-pay | Admitting: Neurology

## 2019-07-07 ENCOUNTER — Encounter: Payer: Self-pay | Admitting: Neurology

## 2019-07-07 ENCOUNTER — Other Ambulatory Visit: Payer: Self-pay

## 2019-07-07 ENCOUNTER — Other Ambulatory Visit: Payer: Self-pay | Admitting: Neurology

## 2019-07-07 ENCOUNTER — Telehealth: Payer: Self-pay | Admitting: *Deleted

## 2019-07-07 ENCOUNTER — Ambulatory Visit: Payer: 59 | Admitting: Neurology

## 2019-07-07 VITALS — BP 127/79 | HR 84 | Temp 97.7°F | Ht 62.0 in | Wt 155.0 lb

## 2019-07-07 DIAGNOSIS — R5383 Other fatigue: Secondary | ICD-10-CM

## 2019-07-07 DIAGNOSIS — Z79899 Other long term (current) drug therapy: Secondary | ICD-10-CM

## 2019-07-07 DIAGNOSIS — M9901 Segmental and somatic dysfunction of cervical region: Secondary | ICD-10-CM | POA: Diagnosis not present

## 2019-07-07 DIAGNOSIS — M9903 Segmental and somatic dysfunction of lumbar region: Secondary | ICD-10-CM | POA: Diagnosis not present

## 2019-07-07 DIAGNOSIS — F988 Other specified behavioral and emotional disorders with onset usually occurring in childhood and adolescence: Secondary | ICD-10-CM | POA: Diagnosis not present

## 2019-07-07 DIAGNOSIS — G35 Multiple sclerosis: Secondary | ICD-10-CM | POA: Diagnosis not present

## 2019-07-07 DIAGNOSIS — G43009 Migraine without aura, not intractable, without status migrainosus: Secondary | ICD-10-CM | POA: Diagnosis not present

## 2019-07-07 DIAGNOSIS — R519 Headache, unspecified: Secondary | ICD-10-CM | POA: Diagnosis not present

## 2019-07-07 DIAGNOSIS — M5416 Radiculopathy, lumbar region: Secondary | ICD-10-CM | POA: Diagnosis not present

## 2019-07-07 MED ORDER — TOPIRAMATE 50 MG PO TABS: ORAL_TABLET | ORAL | 3 refills | Status: DC

## 2019-07-07 NOTE — Telephone Encounter (Signed)
Faxed updated lab orders for pt monthly lemtrada labs to [facility] at [phone]. Received fax confirmation. Requested that they allow pt to schedule multiple lab visits in the future per pt request.

## 2019-07-07 NOTE — Progress Notes (Signed)
[facility]  PATIENT: Carrie Nelson DOB: 04/13/1980  REFERRING CLINICIAN: Daniel Nones HISTORY FROM: Patient   REASON FOR VISIT: Multiple Sclerosis   HISTORICAL  CHIEF COMPLAINT:  Chief Complaint  Patient presents with  . Follow-up    RM 12, alone. Last seen 01/05/2019. She has f/u with GI MD next week to talk about anti reflux treatments.   . Multiple Sclerosis    On Lemtrada.Had 2nd yr. Lemtrada infusions in November 2018.                                                f                                                                    HISTORY OF PRESENT ILLNESS:  Carrie Nelson is a 39 y.o. woman who was diagnosed with relapsing remitting MS in 2007.    Update 07/07/2019: She denies any MS exacerbation or new symptom.  She did [place] in November 2017 and November 2018.    She is compliant with REMS.  She denies difficulty with her gait though the right leg sometimes feels heavy.   She denies any difficulty with her arms.   She sometimes notes some paresthesias on the eft side of her face, only if hot and better after cooling back down.        She is sleeping well on xanax.   She has some MS related ADD and Concerta has helped.    She has had GERD, noted after needing to constantly clear throat, especially after eating food.   She had a scope and was found to have irritation so was started on Nexium.    She is doing worse so started seeing GI and had EGD, 24 hr pH and manometry.   She has a small HH.    She has questions if linked to MS and we discussed unlikely.      Migraines have done well on topiramate.     Update 01/05/2019: Her MS is doing very well with no new symptoms.  She is walking well and notes mild right leg weakness when she walks longer distances.    She denies numbness.,   Vision and bladder function are doing well.  She has fatigue and ADD.   Concerta has helped both issues.   Sh sleeps well most nights (with low dose alprazolam.   On topiramate has helped her migraines and they are now rare.     She did get diagnosed with hyperthyroidism after her Julaine Hua and had an iodine ablation.  She now has hypothyroidism and was started on synthroid and needs some adjustments.     Update 07/02/2018 (virtual) Her MS has done well and she denies any new MS symptoms or any exacerbations.  She completed a second course of Lemtrada November 2018.  She tolerated it very well and does have mild infusion reactions.  She is compliant with the monthly rems monitoring.  She was diagnosed with thyroid disorder treated with radioactive iodine and levothyroxine.  She has weakness and clumsiness in the right leg which  is affecting her gait.  She stumbles some but has no falls.  Bladder function is fine.  Vision is doing well.  She notes fatigue that is improved by Concerta.  She tolerates it well.  She sleeps well most nights.  Mood has done well with fluoxetine.  She denies any current depression or anxiety.  She does not note any major cognitive issues.   She does have migraine headaches but they have done better on Topamax.  She is currently on 50 mg nightly and we discussed going up to 100 mg.  When the migraine occurs she will take ibuprofen.    Update 12/25/2017: She feels her MS has been stable.  She had her second course of Lemtrada in November 2018.  She tolerated it well with just mild infusion reactions.  She has been compliant with monthly REMS monitoring.  Of note, she did have hypothyroidism that has been treated with radioactive iodine and levothyroxine..   She feels stable neurologically.  Her gait is doing about the same as she has no falls.  Her right leg is mildly clumsy but not weak.  Bladder function is fine.  Vision is fine.  She notes some fatigue.  In the past she was on Concerta with some benefit but discontinued when she was diagnosed with hypothyroidism.  Cognition is doing well.  Mood is doing well.    Her fatigue improved  with Concerta.   It also helps her focus better.      She does have migraine headaches.  She is on Inderal LA 60 mg as an agent to help hyperthyroid symptoms. It helped migraine but not now.   It also makes her BP low (though not symptomatic).   She also takes Maxalt 10 mg as needed.  It often helps but she sometimes needs a second one and it makes her sleepy.   Migraines were very rare until recently.   She has migraines 1/week lasting several hours with intense pain but milder pain x 36 hours.       Update 06/24/2017: She had a second course of Lemtrada infusions November 2018.  She tolerated the infusions well with only mild .  Her MS has been stable she has not had any exacerbations.  She is compliant with monthly REMS monitoring.  She was recently diagnosed with hyperthyroidism and had 1 dose of radioactive iodine 5 weeks ago.  She feels gait is doing about the same.  The right leg is mildly clumsy compared to the left leg but she is not having any falls.  She notes no difficulties with her bladder.    Vision is fine.  She continues to have some fatigue both physical and mental.  Concerta was helping but she stopped when the hyperthyroidism was diagnosed.  .   She has mild difficulties with insomnia since the thyroid issues.   She denies cognitive issues.  She has no problems with mood.  Update 12/25/2016:    She had her first treatment with [facility] November 2017 and will have her second 3 day course next month. She feels her MS has been very stable since starting [place]. Specifically there has not been any exacerbations. She had a few mild infusion reactions the week of the infusions but was fine by the next Monday. She feels she is stable neurologically with no new numbness or weakness. The right leg is mildly clumsier and slightly weaker than the left leg. She notes this mostly when she tries to move fast.  She denies any difficulties with her bladder. There is no problems with vision.  She has  fatigue, physical and mental.   Concerta helps best when she takes mid morning.    Most days, she just takes one dose in the mid morning.  By taking it later, she has not needed the second dose.  She sleeps well at night.    She denies cognitive issues.     She denies any significant depression or anxiety.   She is on low dose Prozac with benefit   From 08/02/2015  MS:   She had her first week of Lemtrada infusions November 13 through 01/13/2016. She will get 3 additional days in November 2018. She tolerated the infusions fairly well though she did have some chills and headaches. She also felt weaker the next week.   Fatigue continues to be her major problem.  Gait/strength/sensation:    Gait strength and sensation are generally doing well. She sometimes drags the right leg when she is more tired notices that the right leg is weak when she climbs stairs sometimes there will be mild dysesthesias in that leg but they are never troubling.   Bladder: She denies urinary frequency, urgency or hesitancy..              Vision:   She denies any MS related vision problem. e.     Fatigue/sleep/cognition:     She has more fatigue than last year.   Concerta 36 mg once or twice a day helps.  She usually takes the first dose in the morning and will take a second dose around noon some days. Besides MS fatigue, she notes less sleepiness in the afternoons.   She sleeps well at night.     She notes that her decreased focus is also better.    She denies memory problems.     Mood:   She denies any depression.   She notes mild anxiety helped by Prozac 10 mg   MS History:   She was diagnosed in 2007 but,  in retrospect, had right foot dragging that started around 2005. Then shortly after that she had an episode of left facial numbness. In 2007 she had an episode of slurred speech and worsening clumsiness.  MRI of the brain that was consistent with multiple sclerosis. She also had a lumbar puncture and her CSF compatible  with the diagnosis of MS. Most of her symptoms improved over the next few months but she did not get 100% back to baseline and she continues to have minimal residual weakness, clumsiness and numbness. She was initially placed on Betaseron but switched to Tysabri because of some generalized side effects of Betaseron. She was seeing Dr. Tinnie Gens 2007 until 2014.   She was on Tysabri between 2008 and late 2014 when she stopped because of a desire to get pregnant. She became pregnant in early 2015 and delivered in November 2015. She had not been on any disease modifying therapy since late 2014 and then started Tecfidera 02/2014.Marland Kitchen   She did not tolerate it well and started [facility] 12/2015.     REVIEW OF SYSTEMS:  Constitutional: No fevers, chills, sweats, or change in appetite Eyes: No visual changes, double vision, eye pain Ear, nose and throat: No hearing loss, ear pain, nasal congestion, sore throat Cardiovascular: No chest pain, palpitations Respiratory:  No shortness of breath at rest or with exertion.   No wheezes GastrointestinaI: No nausea, vomiting, diarrhea, abdominal pain, fecal incontinence Genitourinary:  No  dysuria, urinary retention or frequency.  No nocturia. Musculoskeletal:  No neck pain, back pain Integumentary: No rash, pruritus, skin lesions Neurological: as above Psychiatric: No depression at this time.  No anxiety.   Occasionally she has irritability. Endocrine: She she is borderline diabetic. No palpitations, diaphoresis, change in appetite, change in weigh or increased thirst Hematologic/Lymphatic:  No anemia, purpura, petechiae. Allergic/Immunologic: She has seasonal allergies and is treated with several medications.  ALLERGIES: No Known Allergies  HOME MEDICATIONS: Outpatient Medications Prior to Visit  Medication Sig Dispense Refill  . Alemtuzumab (LEMTRADA) 12 MG/1.2ML SOLN Inject 12 mg into the vein.    Marland Kitchen ALPRAZolam (XANAX) 0.5 MG tablet Take 1 tablet (0.5 mg total)  by mouth at bedtime as needed for anxiety. 90 tablet 2  . aspirin EC 81 MG tablet Take 81 mg by mouth daily.     . cholecalciferol (VITAMIN D) 1000 UNITS tablet Take 2,000 Units by mouth daily.     Marland Kitchen esomeprazole (NEXIUM) 20 MG capsule Take 20 mg by mouth every morning.    Marland Kitchen FLUoxetine (PROZAC) 20 MG capsule Pt is taking  daily 90 capsule 4  . levothyroxine (SYNTHROID) 175 MCG tablet Take 1 tablet by mouth daily. Pt has to have brand name synthroid- generic causes itchy raised rash    . methylphenidate (CONCERTA) 36 MG PO CR tablet TAKE 1 TO 2 TABLETS BY MOUTH BY MOUTH DAILY AS NEEDED 90 tablet 0  . montelukast (SINGULAIR) 10 MG tablet Take 10 mg by mouth at bedtime.   3  . Multiple Vitamins-Minerals (MULTIVITAMIN PO) Take by mouth.    . Norethindrone Acetate-Ethinyl Estradiol (LARIN 1.5/30) 1.5-30 MG-MCG tablet Take 1 tablet by mouth daily. 63 tablet 5  . saccharomyces boulardii (FLORASTOR) 250 MG capsule Take by mouth.    Marland Kitchen XIIDRA 5 % SOLN   6  . topiramate (TOPAMAX) 50 MG tablet One or two po nightly 180 tablet 3   No facility-administered medications prior to visit.    PAST MEDICAL HISTORY: Past Medical History:  Diagnosis Date  . GERD (gastroesophageal reflux disease)   . Hypothyroidism   . Migraines   . Multiple sclerosis (HCC)   . Syncope   . Thyrotoxicosis   . Tremor     PAST SURGICAL HISTORY: Past Surgical History:  Procedure Laterality Date  . 24 HOUR PH STUDY N/A 06/24/2019   Procedure: 24 HOUR PH STUDY;  Surgeon: Napoleon Form, MD;  Location: [facility];  Service: Endoscopy;  Laterality: N/A;  . BUNIONECTOMY Left   . CESAREAN SECTION  01/22/2014  . ESOPHAGEAL MANOMETRY N/A 06/24/2019   Procedure: ESOPHAGEAL MANOMETRY (EM);  Surgeon: Napoleon Form, MD;  Location: [facility];  Service: Endoscopy;  Laterality: N/A;  . KNEE ARTHROSCOPY W/ ACL RECONSTRUCTION Left 2010  . PH IMPEDANCE STUDY N/A 06/24/2019   Procedure: PH IMPEDANCE STUDY;  Surgeon:  Napoleon Form, MD;  Location: [facility];  Service: Endoscopy;  Laterality: N/A;  . UPPER GASTROINTESTINAL ENDOSCOPY  05/07/2019    FAMILY HISTORY: Family History  Problem Relation Age of Onset  . Prostate cancer Father   . Diabetes Father   . Arthritis Other   . Dementia Other   . Hypertension Other   . Heart disease Other   . Kidney disease Maternal Grandmother   . Esophageal cancer Maternal Grandfather   . Heart disease Paternal Grandfather   . Colon cancer Neg Hx   . Rectal cancer Neg Hx   . Stomach cancer Neg Hx  SOCIAL HISTORY:  Social History   Socioeconomic History  . Marital status: Married    Spouse name: Not on file  . Number of children: 1  . Years of education: Not on file  . Highest education level: Not on file  Occupational History  . 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 Smoking status: Never Smoker  . Smokeless tobacco: Never Used  Substance and Sexual Activity  . Alcohol use: Yes    Comment: minimal weekly   . Drug use: No  . Sexual activity: Yes    Birth control/protection: Pill  Other Topics Concern  . Not on file  Social History Narrative   Patient lives with parents.   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    Patient has a Probation officer.    Social Determinants of Health   Financial Resource Strain:   . Difficulty of Paying Living Expenses:   Food Insecurity:   . Worried About Programme researcher, broadcasting/film/video in the Last Year:   . Barista in the Last Year:   Transportation Needs:   . Freight forwarder (Medical):   Marland Kitchen Lack of Transportation (Non-Medical):   Physical Activity:   . Days of Exercise per Week:   . Minutes of Exercise per Session:   Stress:   . Feeling of Stress :   Social Connections:   . Frequency of Communication with Friends and Family:   . Frequency of Social Gatherings with Friends and Family:   . Attends Religious  Services:   . Active Member of Clubs or Organizations:   . Attends Banker Meetings:   Marland Kitchen Marital Status:   Intimate Partner Violence:   . Fear of Current or Ex-Partner:   . Emotionally Abused:   Marland Kitchen Physically Abused:   . Sexually Abused:      PHYSICAL EXAM  Vitals:   07/07/19 1251  BP: 127/79  Pulse: 84  Temp: 97.7 F (36.5 C)  Weight: 155 lb (70.3 kg)  Height: 5\' 2"  (1.575 m)    Body mass index is 28.35 kg/m.   General: The patient is well-developed and well-nourished and in no acute distress   Neurologic Exam  Mental status: The patient is alert and oriented x 3 at the time of the examination. The patient has apparent normal recent and remote memory, with an apparently normal attention span and concentration ability.   Speech is normal.  Cranial nerves:   Extraocular muscles are intact.  Facial strength and sensation is normal.  Trapezius strength is normal.  Hearing is normal symmetric  Motor:  Muscle bulk and tone are normal. Strength is  5 / 5 in all 4 extremities.   Sensory: She has intact sensation to touch and vibration  Coordination: Finger-nose-finger and heel-to-shin is performed well.  Gait and station: Station and gait are normal.  Tandem gait is slightly wide.  Romberg negative.  Reflexes: Deep tendon reflexes are symmetric and normal in arms. Mildly increased right knee DTR.         ASSESSMENT AND PLAN  Multiple sclerosis (HCC)  Other fatigue  Common migraine without intractability  High risk medication use  Attention deficit disorder, unspecified hyperactivity presence   1.   Continue Lemtrada REMS monitoring.   We discussed getting an MRI of the brain.   She would like to wait until GI issues are behind her and will call back  2.  Continue Concerta for fatigue. 3.  Topamax 100 mg nightly. 4.  Return to see me in 6 months or sooner if there are new or worsening neurologic symptoms.  Astryd Pearcy A. Felecia Shelling, MD, PhD 3/54/6568,  1:27 PM Certified in Neurology, Clinical Neurophysiology, Sleep Medicine, Pain Medicine and Neuroimaging  [facility] [address], [place], [place] [address] [phone]-

## 2019-07-14 ENCOUNTER — Ambulatory Visit: Payer: 59 | Admitting: Internal Medicine

## 2019-07-14 ENCOUNTER — Encounter: Payer: Self-pay | Admitting: Internal Medicine

## 2019-07-14 VITALS — BP 110/70 | HR 84 | Ht 62.0 in | Wt 154.0 lb

## 2019-07-14 DIAGNOSIS — K219 Gastro-esophageal reflux disease without esophagitis: Secondary | ICD-10-CM

## 2019-07-14 DIAGNOSIS — R0989 Other specified symptoms and signs involving the circulatory and respiratory systems: Secondary | ICD-10-CM

## 2019-07-14 DIAGNOSIS — R6889 Other general symptoms and signs: Secondary | ICD-10-CM | POA: Diagnosis not present

## 2019-07-14 NOTE — Patient Instructions (Signed)
You have been scheduled with Dr Barron Alvine to discuss possible TIF procedure on 09/03/19 at 11:00 am at our [facility] location. [address]  [place], [place] [address]  If you are age 41 or older, your body mass index should be between 23-30. Your Body mass index is 28.17 kg/m. If this is out of the aforementioned range listed, please consider follow up with your Primary Care Provider.  If you are age 24 or younger, your body mass index should be between 19-25. Your Body mass index is 28.17 kg/m. If this is out of the aformentioned range listed, please consider follow up with your Primary Care Provider.   Due to recent changes in healthcare laws, you may see the results of your imaging and laboratory studies on MyChart before your provider has had a chance to review them.  We understand that in some cases there may be results that are confusing or concerning to you. Not all laboratory results come back in the same time frame and the provider may be waiting for multiple results in order to interpret others.  Please give [place] 48 hours in order for your provider to thoroughly review all the results before contacting the office for clarification of your results.

## 2019-07-14 NOTE — Progress Notes (Signed)
   Subjective:    Patient ID: Carrie Nelson, female    DOB: Jun 17, 1980, 39 y.o.   MRN: [id number]  HPI Carrie Nelson is a 39 year old female with a history of GERD and LPR who is seen for follow-up.  She is here today with her husband.  She also has a history of multiple sclerosis, thyroid disease.  I saw her in February 2021 to evaluate throat clearing and globus sensation.  We pursued upper endoscopy to further evaluate probable reflux and this was performed on 05/07/2019.  It showed normal esophagus.  The GE junction was noted to be patulous.  There was possibility of a very small hiatal hernia.  The stomach and duodenum were normal.  Distal esophageal biopsies were normal with slight hyperemia.  We followed this up with 24-hour pH and impedance testing along with high-resolution esophageal manometry.  Manometry showed normal EG junction pressure and relaxation.  All swallows were peristaltic.  There is no manometric evidence of hiatal hernia.  The pH and impedance study was performed on PPI therapy which showed good acid control however it did show increased reflux events with increased esophageal bolus exposure and weak acid reflux events.  Dr. Lavon Paganini read the study and recommended consideration of an antireflux procedure.  Thus she is seen today to discuss further.  Despite Nexium she continues to have regurgitation and throat clearing after meals and drinking.  This symptom along with some globus sensation which improved with Nexium are her really only reflux symptoms, though the throat clearing has not improved with Nexium.  She does not have traditional heartburn.  There is no nausea or vomiting.  No dysphagia or odynophagia.  Her husband does note that she has throat clearing at night.  She is not having abdominal pain or lower GI complaint.   Review of Systems As per HPI, otherwise negative  Current Medications, Allergies, Past Medical History, Past Surgical History, Family History  and Social History were reviewed in [employer] record.     Objective:   Physical Exam BP 110/70   Pulse 84   Ht 5\' 2"  (1.575 m)   Wt 154 lb (69.9 kg)   BMI 28.17 kg/m  Gen: awake, alert, NAD HEENT: anicteric Neuro: nonfocal     Assessment & Plan:  39 year old female with a history of GERD and LPR who is seen for follow-up.  She is here today with her husband  1. GERD and LPR --24-hour pH impedance confirms reflux disease though acid is well controlled on PPI therapy, Nexium 40 mg a day.  I do think she would be a good candidate for antireflux procedure and we spent time today discussing both TIF and Nissen fundoplication.  She has done research on both and had good questions.  I recommend that she meet with my partner Dr. 39 to discuss her candidacy for TIF.  I do think she would benefit and have significant improvement in her symptoms.  She would also likely be able to remain off PPI therapy indefinitely.  For now we will continue Nexium 40 mg a day. --Referral to see Dr. Doristine Locks to discuss TIF --For now continue Nexium 40 mg daily  30 minutes total spent today including patient facing time, coordination of care, reviewing medical history/procedures/pertinent radiology studies, and documentation of the encounter.

## 2019-07-15 ENCOUNTER — Telehealth: Payer: Self-pay | Admitting: Internal Medicine

## 2019-07-15 NOTE — Telephone Encounter (Signed)
Patient is returning your call no other info provided

## 2019-07-15 NOTE — Telephone Encounter (Signed)
Patient has been rescheduled to see Dr Barron Alvine at our [facility] office for 08/21/19 at 10:40 am. She verbalizes understanding.

## 2019-08-04 DIAGNOSIS — M9903 Segmental and somatic dysfunction of lumbar region: Secondary | ICD-10-CM | POA: Diagnosis not present

## 2019-08-04 DIAGNOSIS — M5416 Radiculopathy, lumbar region: Secondary | ICD-10-CM | POA: Diagnosis not present

## 2019-08-04 DIAGNOSIS — R519 Headache, unspecified: Secondary | ICD-10-CM | POA: Diagnosis not present

## 2019-08-04 DIAGNOSIS — M9901 Segmental and somatic dysfunction of cervical region: Secondary | ICD-10-CM | POA: Diagnosis not present

## 2019-08-21 ENCOUNTER — Ambulatory Visit: Payer: 59 | Admitting: Gastroenterology

## 2019-08-21 ENCOUNTER — Encounter: Payer: Self-pay | Admitting: Gastroenterology

## 2019-08-21 ENCOUNTER — Other Ambulatory Visit: Payer: Self-pay | Admitting: Neurology

## 2019-08-21 VITALS — BP 102/80 | HR 96 | Temp 98.2°F | Ht 62.0 in | Wt 151.0 lb

## 2019-08-21 DIAGNOSIS — K219 Gastro-esophageal reflux disease without esophagitis: Secondary | ICD-10-CM

## 2019-08-21 DIAGNOSIS — K449 Diaphragmatic hernia without obstruction or gangrene: Secondary | ICD-10-CM

## 2019-08-21 DIAGNOSIS — G35 Multiple sclerosis: Secondary | ICD-10-CM | POA: Diagnosis not present

## 2019-08-21 DIAGNOSIS — R0989 Other specified symptoms and signs involving the circulatory and respiratory systems: Secondary | ICD-10-CM

## 2019-08-21 NOTE — Progress Notes (Signed)
P  Chief Complaint:    GERD, referral for antireflux surgery  Referring Physician: Dr. Hilarie Fredrickson   HPI:    Carrie Nelson is a 39 year old female with a history of multiple sclerosis (received Lemtrada), hypothyroidism s/p iodine ablation with subsequent hypothyroidism (on Synthroid), referred to me by Dr. Hilarie Fredrickson for evaluation of possible antireflux intervention with Transoral Incisionless Fundoplication (TIF) with a goal to stop or significantly reduce acid suppression therapy.  ENT did laryngoscopy in [place] in 01/2019 with e/o reflux irritation. Started Nexium 40 mg x6 weeks, witout appreciable improvement.  GERD history: -Index symptoms: Regurgitation, increased throat clearing, globus sensation.  Increased nocturnal throat clearing.  No dysphagia or heartburn -Exacerbating factors: Increased postprandial symptoms -Medications trialed: Nexium -Current medications: Nexium 40 mg/day -Complications: LPR, LES laxity with small HH   GERD evaluation: -Last EGD: 05/07/2019.  Images reviewed by me today -Barium esophagram: None -Esophageal Manometry: Normal, no HH -pH/Impedance (on PPI): Good acid control with DeMeester 2.2, increased reflux events with increased esophageal bolus exposure with predominantly weakly acid reflux (95 events with 92 being weakly acid) -recommended consideration of antireflux surgery  Endoscopic History: -EGD (04/27/2019, Dr. 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 GERD (gastroesophageal reflux disease)   . Hypothyroidism   . Migraines   . Multiple sclerosis ([place])   . Syncope   . Thyrotoxicosis   . Tremor     Patient's surgical history, family medical history, social history, medications and allergies were all reviewed in  Epic    Current Outpatient Medications  Medication Sig Dispense Refill  . ALPRAZolam (XANAX) 0.5 MG tablet Take 1 tablet (0.5 mg total) by mouth at bedtime as needed for anxiety. 90 tablet 2  . aspirin EC 81 MG tablet Take 81 mg by mouth daily.     . cholecalciferol (VITAMIN D) 1000 UNITS tablet Take 2,000 Units by mouth daily.     Marland Kitchen esomeprazole (NEXIUM) 40 MG capsule Take 1 capsule by mouth daily.    Marland Kitchen FLUoxetine (PROZAC) 20 MG capsule Pt is taking 20mg  daily 90 capsule 4  . levothyroxine (SYNTHROID) 175 MCG tablet Take 1 tablet by mouth daily. Pt has to have brand name synthroid- generic causes itchy raised rash    . methylphenidate (CONCERTA) 36 MG PO CR tablet TAKE 1 TO 2 TABLETS BY MOUTH BY MOUTH DAILY AS NEEDED (Patient taking differently: TAKE 2 TABLETS BY MOUTH BY MOUTH DAILY AS NEEDED) 90 tablet 0  . montelukast (SINGULAIR) 10 MG tablet Take 10 mg by mouth at bedtime.   3  . Multiple Vitamins-Minerals (MULTIVITAMIN PO) Take by mouth.    . Norethindrone Acetate-Ethinyl Estradiol (LARIN 1.5/30) 1.5-30 MG-MCG tablet Take 1 tablet by mouth daily. 63 tablet 5  . topiramate (TOPAMAX) 50 MG tablet One or two po nightly (Patient taking differently: two po nightly) 180 tablet 3  . XIIDRA 5 % SOLN   6  . Alemtuzumab (LEMTRADA) 12 MG/1.2ML SOLN Inject 12 mg into the vein. (Patient not taking: Reported on 08/21/2019)    . saccharomyces boulardii (FLORASTOR) 250 MG capsule Take by mouth. (Patient not taking: Reported on 08/21/2019)     No current facility-administered medications for this visit.    Physical Exam:  BP 102/80   Pulse 96   Temp 98.2 F (36.8 C)   Ht 5\' 2"  (1.575 m)   Wt 151 lb (68.5 kg)   BMI 27.62 kg/m   GENERAL:  Pleasant female in NAD PSYCH: : Cooperative, normal affect Musculoskeletal:  Normal muscle tone, normal strength NEURO: Alert and oriented x 3, no focal neurologic deficits   IMPRESSION and PLAN:    1) GERD 2) Increased throat clearing/LPR 3) Globus  sensation 4) Hiatal hernia  39 year old female with mostly atypical reflux symptoms. Even though recent pH/impedance testing demonstrates good control of acid (on PPI), she has clear objective evidence of continued refluxate, likely 2/2 anatomic disruption given significant LES laxity. EGD with Hill grade 3 valve. I reviewed all those images today, and appears to have a 2.5-3 cm transverse width hernia. Discussed treatment options to include ongoing medical management versus antireflux surgery, and she strongly prefers the latter. Given the advanced Hill grade valve and significant laxity, I recommend cTIF which would allow for laparoscopic crural closure followed by endoscopic fundoplication. She agrees and wishes to proceed.  -Referral to Dr. 41 at [facility] for evaluation of crural repair and cTIF -Continue Nexium as prescribed -Continue antireflux lifestyle/dietary modifications -Discussed the risks, benefits, alternatives of TIF with the patient at length today -Discussed the postoperative dietary/exercise limitations. Will send a copy of these dietary restrictions to her prior to surgery -We will send message to her Neurologist to confirm okay to proceed with elective enteric ulcer  -Pending appointment with Dr. Andrey Campanile, will schedule for TIF at [facility] with plan for admission for overnight for post-operative observation  - NPO at [facility] prior to the procedure - Confirmed allergies with the patient and no allergies to planned perioperative antibiotics - Anticipated 23 hour stay  5) Multiple sclerosis: -Previously treated with [facility]. Serial labs have all been stable -We will confirm with her Neurologist regarding elective antireflux surgery  I spent 45 minutes of time, including in depth chart review, independent review of results as outlined above, communicating results with the patient and her husband directly, face-to-face time with the patient, coordinating care, ordering  studies and medications as appropriate, and documentation.      [name] ,DO, FACG 08/21/2019, 11:11 AM

## 2019-08-21 NOTE — Patient Instructions (Signed)
We have sent a referral to [facility] at [address] [place], [place] [address]. [phone]. You will be receiving a call to schedule an appointment with them soon. With Dr Andrey Campanile to discuss TIF  It was a pleasure to see you today!  Vito Cirigliano, D.O.

## 2019-08-24 NOTE — Telephone Encounter (Signed)
Pt is up to date on her appts and is due for a refill on concerta. [place] Controlled Substance Registry checked and is appropriate.

## 2019-09-03 ENCOUNTER — Ambulatory Visit: Payer: 59 | Admitting: Gastroenterology

## 2019-09-10 ENCOUNTER — Encounter: Payer: Self-pay | Admitting: Neurology

## 2019-09-18 ENCOUNTER — Ambulatory Visit: Payer: Self-pay | Admitting: General Surgery

## 2019-09-18 DIAGNOSIS — G35 Multiple sclerosis: Secondary | ICD-10-CM | POA: Diagnosis not present

## 2019-09-18 DIAGNOSIS — K219 Gastro-esophageal reflux disease without esophagitis: Secondary | ICD-10-CM | POA: Diagnosis not present

## 2019-09-18 DIAGNOSIS — K449 Diaphragmatic hernia without obstruction or gangrene: Secondary | ICD-10-CM | POA: Diagnosis not present

## 2019-09-18 NOTE — H&P (Signed)
Carrie Nelson Appointment: 09/18/2019 9:00 AM Location: [facility] Patient #: [id number] DOB: 05/31/1980 Married / Language: [name] / Race: White Female  History of Present Illness Carrie Areola M. Renezmae Canlas MD; 09/18/2019 10:45 AM) The patient is a 39 year old female who presents for an evaluation of a hernia. She is referred by Dr Barron Alvine to discuss concomitant lap hiatal hernia repair with TIF. She states that her symptoms began about a year ago. She initially started having episodes where she felt she was having to constantly clear her throat. She initially thought it was sinus drainage causing irritation. Her husband is a Engineer, civil (consulting). She initially tried Mucinex but that didn't help. Her husband thought it may be heartburn related since she started taking some Nexium with no improvement. She ended up seeing [facility] and they performed a laryngoscopy by report it showed some reflux irritation on the vocal cords. She chin tried several months of Nexium. She states that it helped a little bit but she was still having to clear her throat. She denies any heartburn. She denies any early satiety. She denies any sour taste in the back for mouth. She denies any burning in her chest. She denies any regurgitation of solid food. She denies any pain with swallowing solids or liquids. She ended up seeing Dr. Margretta Sidle and ended up undergoing an upper endoscopy which is described below which I personally reviewed as well. She then underwent esophageal manometry and pH studies which I reviewed as well. I also reviewed both gastroenterologist office notes as well as the note from her neurologist. She has multiple sclerosis. She is currently not taking the lemtrada. It some medicine that she take about once a year and only repeat if have a flare  ENT did laryngoscopy in [place] in 01/2019 with e/o reflux irritation. Started Nexium 40 mg x6 weeks, witout appreciable improvement.  GERD  history: -Index symptoms: Regurgitation, increased throat clearing, globus sensation. Increased nocturnal throat clearing. No dysphagia or heartburn -Exacerbating factors: Increased postprandial symptoms -Medications trialed: Nexium -Current medications: Nexium 40 mg/day -Complications: LPR, LES laxity with small HH  GERD evaluation: -Last EGD: 05/07/2019. Images reviewed by me today -Barium esophagram: None -Esophageal Manometry: Normal, no HH -pH/Impedance (on PPI): Good acid control with DeMeester 2.2, increased reflux events with increased esophageal bolus exposure with predominantly weakly acid reflux (95 events with 92 being weakly acid) -recommended consideration of antireflux surgery  Endoscopic History: -EGD (04/27/2019, Dr. 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 Andrey Campanile, MD; 09/18/2019 10:51 AM) HIATAL HERNIA WITH GERD (K21.9, K44.9) MULTIPLE SCLEROSIS (G35)  Past Surgical History ([name], CMA; 09/18/2019 8:57 AM) Cesarean Section - 1 Foot Surgery Right. Knee Surgery Left.  Diagnostic Studies History ([name], CMA; 09/18/2019 8:57 AM) Colonoscopy never Mammogram within last year Pap Smear 1-5 years ago  Allergies ([name], CMA; 09/18/2019 8:58 AM) No Known Drug Allergies [09/18/2019]: Allergies Reconciled  Medication History ([name], CMA; 09/18/2019 8:59 AM) Esomeprazole Magnesium (40MG  Capsule DR, Oral) Active. FLUoxetine HCl (20MG  Capsule, Oral) Active. Synthroid ( Tablet, Oral) Active. Concerta (36MG  Tablet ER, Oral) Active. Montelukast Sodium (10MG  Tablet, Oral) Active. Larin 1.5/30 (1.5-30MG -MCG Tablet, Oral) Active. Topiramate (50MG  Tablet, Oral) Active. Medications Reconciled  Social History , CMA; 09/18/2019 8:57 AM) Alcohol use Occasional alcohol use. Caffeine use Coffee. No drug  use Tobacco use Never smoker.  Family History , 06-29-1982; 09/18/2019  8:57 AM) Diabetes Mellitus Father. Heart Disease Father. Hypertension Mother. Prostate Cancer Father.  Pregnancy / Birth History Carrie Nelson, CMA; 09/18/2019 8:57 AM) Age at menarche 6 years. Contraceptive History Oral contraceptives. Gravida 1 Maternal age 61-35 Para 1 Regular periods  Other Problems Carrie Areola M. Andrey Campanile, MD; 09/18/2019 10:51 AM) Gastroesophageal Reflux Disease Migraine Headache Other disease, cancer, significant illness Thyroid Disease     Review of Systems (Chanel Nolan CMA; 09/18/2019 8:57 AM) General Not Present- Appetite Loss, Chills, Fatigue, Fever, Night Sweats, Weight Gain and Weight Loss. Skin Not Present- Change in Wart/Mole, Dryness, Hives, Jaundice, New Lesions, Non-Healing Wounds, Rash and Ulcer. HEENT Present- Seasonal Allergies. Not Present- Earache, Hearing Loss, Hoarseness, Nose Bleed, Oral Ulcers, Ringing in the Ears, Sinus Pain, Sore Throat, Visual Disturbances, Wears glasses/contact lenses and Yellow Eyes. Respiratory Not Present- Bloody sputum, Chronic Cough, Difficulty Breathing, Snoring and Wheezing. Breast Not Present- Breast Mass, Breast Pain, Nipple Discharge and Skin Changes. Cardiovascular Not Present- Chest Pain, Difficulty Breathing Lying Down, Leg Cramps, Palpitations, Rapid Heart Rate, Shortness of Breath and Swelling of Extremities. Gastrointestinal Not Present- Abdominal Pain, Bloating, Bloody Stool, Change in Bowel Habits, Chronic diarrhea, Constipation, Difficulty Swallowing, Excessive gas, Gets full quickly at meals, Hemorrhoids, Indigestion, Nausea, Rectal Pain and Vomiting. Female Genitourinary Not Present- Frequency, Nocturia, Painful Urination, Pelvic Pain and Urgency. Musculoskeletal Not Present- Back Pain, Joint Pain, Joint Stiffness, Muscle Pain, Muscle Weakness and Swelling of Extremities. Neurological Not Present- Decreased Memory,  Fainting, Headaches, Numbness, Seizures, Tingling, Tremor, Trouble walking and Weakness. Psychiatric Not Present- Anxiety, Bipolar, Change in Sleep Pattern, Depression, Fearful and Frequent crying. Endocrine Not Present- Cold Intolerance, Excessive Hunger, Hair Changes, Heat Intolerance, Hot flashes and New Diabetes.  Vitals (Chanel Nolan CMA; 09/18/2019 8:59 AM) 09/18/2019 8:59 AM Weight: 149 lb Height: 62in Body Surface Area: 1.69 m Body Mass Index: 27.25 kg/m  Temp.: 98.21F  Pulse: 105 (Regular)  BP: 114/74(Sitting, Left Arm, Standard)        Physical Exam Carrie Areola M. Season Astacio MD; 09/18/2019 9:43 AM)  General Mental Status-Alert. General Appearance-Consistent with stated age. Hydration-Well hydrated. Voice-Normal.  Head and Neck Head-normocephalic, atraumatic with no lesions or palpable masses. Trachea-midline. Thyroid Gland Characteristics - normal size and consistency.  Eye Eyeball - Bilateral-Extraocular movements intact. Sclera/Conjunctiva - Bilateral-No scleral icterus.  Chest and Lung Exam Chest and lung exam reveals -quiet, even and easy respiratory effort with no use of accessory muscles and on auscultation, normal breath sounds, no adventitious sounds and normal vocal resonance. Inspection Chest Wall - Normal. Back - normal.  Breast - Did not examine.  Cardiovascular Cardiovascular examination reveals -normal heart sounds, regular rate and rhythm with no murmurs and normal pedal pulses bilaterally.  Abdomen Inspection Inspection of the abdomen reveals - No Hernias. Skin - Scar - no surgical scars. Palpation/Percussion Palpation and Percussion of the abdomen reveal - Soft, Non Tender, No Rebound tenderness, No Rigidity (guarding) and No hepatosplenomegaly. Auscultation Auscultation of the abdomen reveals - Bowel sounds normal.  Female Genitourinary Note: old c/s scar  Peripheral Vascular Upper Extremity Palpation - Pulses  bilaterally normal.  Neurologic Neurologic evaluation reveals -alert and oriented x 3 with no impairment of recent or remote memory. Mental Status-Normal.  Neuropsychiatric The patient's mood and affect are described as -normal. Judgment and Insight-insight is appropriate concerning matters relevant to self.  Musculoskeletal Normal Exam - Left-Upper Extremity Strength Normal and Lower Extremity Strength Normal. Normal Exam - Right-Upper Extremity Strength Normal and Lower Extremity Strength Normal.  Lymphatic Head & Neck  General  Head & Neck Lymphatics: Bilateral - Description - Normal. Axillary - Did not examine. Femoral & Inguinal - Did not examine.    Assessment & Plan Carrie Areola M. Saleh Ulbrich MD; 09/18/2019 10:51 AM)  HIATAL HERNIA WITH GERD (K21.9) Impression: She has what appears to be a small hiatal hernia with GERD-related symptoms mainly consisting of a globus sensation. We discussed the anatomy, hiatal hernia, and GERD. She was given educational handout. I showed her the pictures of a normal Hill grade valve versus hers. We discussed ongoing medical management versus surgical management versus concomitant laparoscopic hiatal hernia repair with transoral incisionless fundoplication performed by GI. I have recommended getting an upper GI just to evaluate the hiatus in a different manner. We discussed the steps of a laparoscopic hiatal hernia repair, dissection around the esophagus and upper stomach the diaphragm, mobilizing the esophagus if needed, closure of the diaphragm. We discussed in the transoral incisionless fundoplication. We discussed how this differs from the traditional surgical fundoplication. We discussed the typical postoperative course. We discussed the diet progression. We discussed the typical issues such as difficulty with some solids. We discussed the need in changing eating techniques and behaviors. We discussed the possibility of gas bloat. We discussed the  possibility of trouble swallowing. We discussed the persistence of symptoms. We discussed the risk of recurrent hiatal hernia. We discussed long-term data regarding classical surgical fundoplication versus TIF. We discussed risk and benefits of surgery including but not limited to bleeding, infection, injury to surrounding structures, blood clot formation, cardiac and pulmonary events, hiatal hernia recurrence. We did briefly go over the risk with TIF fundoplication. After all her questions were asked and answered she would like to proceed with concomitant procedure.  This patient encounter took 57 minutes today to perform the following: take history, perform exam, review outside records, interpret imaging, counsel the patient on their diagnosis and document encounter, findings & plan in the EHR  Current Plans Pt Education - Pamphlet Given - Gastroesophagel Reflux Disease: discussed with patient and provided information.  MULTIPLE SCLEROSIS (G35) Impression: She is currently not on any medications.  Mary Sella. Andrey Campanile, MD, FACS General, Bariatric, & Minimally Invasive Surgery [facility], [place]

## 2019-09-21 ENCOUNTER — Other Ambulatory Visit: Payer: Self-pay | Admitting: General Surgery

## 2019-09-21 DIAGNOSIS — K449 Diaphragmatic hernia without obstruction or gangrene: Secondary | ICD-10-CM

## 2019-09-22 DIAGNOSIS — M9901 Segmental and somatic dysfunction of cervical region: Secondary | ICD-10-CM | POA: Diagnosis not present

## 2019-09-22 DIAGNOSIS — M5416 Radiculopathy, lumbar region: Secondary | ICD-10-CM | POA: Diagnosis not present

## 2019-09-22 DIAGNOSIS — R519 Headache, unspecified: Secondary | ICD-10-CM | POA: Diagnosis not present

## 2019-09-22 DIAGNOSIS — M9903 Segmental and somatic dysfunction of lumbar region: Secondary | ICD-10-CM | POA: Diagnosis not present

## 2019-09-29 ENCOUNTER — Telehealth: Payer: Self-pay | Admitting: Gastroenterology

## 2019-09-29 NOTE — Telephone Encounter (Signed)
Abby from [facility] called and wanted to speak with nurse abort Tif/Hernia procedure.  Please Call Abby back @ [phone]

## 2019-09-29 NOTE — Telephone Encounter (Signed)
Lmom for Abby to call back

## 2019-09-29 NOTE — Telephone Encounter (Signed)
Abby from [facility] called and would like to know if you are able to do the TIF/Hernia repair with Dr Andrey Campanile on 11/20/19 at [facility] 7:30 am case

## 2019-10-02 NOTE — Telephone Encounter (Signed)
Abby from [facility] is checking on status of msg and is requesting a call back  CB [phone]

## 2019-10-05 ENCOUNTER — Ambulatory Visit
Admission: RE | Admit: 2019-10-05 | Discharge: 2019-10-05 | Disposition: A | Discharge status: Home / Self Care | Payer: 59 | Source: Ambulatory Visit | Attending: General Surgery | Admitting: General Surgery

## 2019-10-05 DIAGNOSIS — K449 Diaphragmatic hernia without obstruction or gangrene: Secondary | ICD-10-CM

## 2019-10-05 DIAGNOSIS — K228 Other specified diseases of esophagus: Secondary | ICD-10-CM | POA: Diagnosis not present

## 2019-10-05 DIAGNOSIS — K219 Gastro-esophageal reflux disease without esophagitis: Secondary | ICD-10-CM

## 2019-10-06 ENCOUNTER — Other Ambulatory Visit: Payer: Self-pay | Admitting: Neurology

## 2019-10-07 ENCOUNTER — Other Ambulatory Visit: Payer: Self-pay | Admitting: Gastroenterology

## 2019-10-07 DIAGNOSIS — K449 Diaphragmatic hernia without obstruction or gangrene: Secondary | ICD-10-CM

## 2019-10-07 DIAGNOSIS — K219 Gastro-esophageal reflux disease without esophagitis: Secondary | ICD-10-CM

## 2019-10-07 NOTE — Telephone Encounter (Signed)
Abbie with [facility] called to f/u on this message. I made her aware of the status of this msg. Pls call her when you have a chance.

## 2019-10-08 ENCOUNTER — Other Ambulatory Visit: Payer: Self-pay | Admitting: Gastroenterology

## 2019-10-08 DIAGNOSIS — K219 Gastro-esophageal reflux disease without esophagitis: Secondary | ICD-10-CM

## 2019-10-08 DIAGNOSIS — K449 Diaphragmatic hernia without obstruction or gangrene: Secondary | ICD-10-CM

## 2019-10-08 NOTE — Telephone Encounter (Signed)
Patient is scheduled for a HH/TIF with Dr Andrey Campanile and Dr Barron Alvine on 12/18/19 at [facility] hospital

## 2019-10-13 ENCOUNTER — Telehealth: Payer: Self-pay | Admitting: *Deleted

## 2019-10-13 NOTE — Telephone Encounter (Signed)
Received monthly lab results for lemtrada lab program drawn on 10/08/19. Per Dr. Epimenio Foot, labs show possible UTI. Wanting to know if pt has any UTI symptoms. If so, he would like to call in macrobid 100mg  po BID x7 days #14, 0 refills. I called pt. She denies any signs/symptoms of UTI currently. She has her annual physical next week with PCP and will have them check urine again. She will call if she develops any signs/sx of UTI.

## 2019-10-16 DIAGNOSIS — J4520 Mild intermittent asthma, uncomplicated: Secondary | ICD-10-CM | POA: Diagnosis not present

## 2019-10-16 DIAGNOSIS — Z0000 Encounter for general adult medical examination without abnormal findings: Secondary | ICD-10-CM | POA: Diagnosis not present

## 2019-10-16 DIAGNOSIS — Z8639 Personal history of other endocrine, nutritional and metabolic disease: Secondary | ICD-10-CM | POA: Diagnosis not present

## 2019-10-16 DIAGNOSIS — G35 Multiple sclerosis: Secondary | ICD-10-CM | POA: Diagnosis not present

## 2019-10-16 DIAGNOSIS — E7849 Other hyperlipidemia: Secondary | ICD-10-CM | POA: Diagnosis not present

## 2019-10-21 DIAGNOSIS — R519 Headache, unspecified: Secondary | ICD-10-CM | POA: Diagnosis not present

## 2019-10-21 DIAGNOSIS — M9901 Segmental and somatic dysfunction of cervical region: Secondary | ICD-10-CM | POA: Diagnosis not present

## 2019-10-21 DIAGNOSIS — M5416 Radiculopathy, lumbar region: Secondary | ICD-10-CM | POA: Diagnosis not present

## 2019-10-21 DIAGNOSIS — M9903 Segmental and somatic dysfunction of lumbar region: Secondary | ICD-10-CM | POA: Diagnosis not present

## 2019-10-22 DIAGNOSIS — G35 Multiple sclerosis: Secondary | ICD-10-CM | POA: Diagnosis not present

## 2019-10-22 DIAGNOSIS — Z8639 Personal history of other endocrine, nutritional and metabolic disease: Secondary | ICD-10-CM | POA: Diagnosis not present

## 2019-10-22 DIAGNOSIS — Z0000 Encounter for general adult medical examination without abnormal findings: Secondary | ICD-10-CM | POA: Diagnosis not present

## 2019-10-22 DIAGNOSIS — J4520 Mild intermittent asthma, uncomplicated: Secondary | ICD-10-CM | POA: Diagnosis not present

## 2019-10-23 ENCOUNTER — Ambulatory Visit: Payer: 59 | Attending: Internal Medicine

## 2019-10-23 DIAGNOSIS — Z23 Encounter for immunization: Secondary | ICD-10-CM

## 2019-10-23 NOTE — Progress Notes (Signed)
   Covid-19 Vaccination Clinic  Name:  Carrie Nelson    MRN: [id number] DOB: 22-Dec-1980  10/23/2019  [name] was observed post Covid-19 immunization for 15 minutes without incident. She was provided with Vaccine Information Sheet and instruction to access the V-Safe system.   [name] was instructed to call 911 with any severe reactions post vaccine: Marland Kitchen Difficulty breathing  . Swelling of face and throat  . A fast heartbeat  . A bad rash all over body  . Dizziness and weakness   Immunizations Administered    Name Date Dose VIS Date Route   Pfizer COVID-19 Vaccine 10/23/2019 11:54 AM 0.3 mL 04/22/2018 Intramuscular   Manufacturer: [employer], [employer]   Lot: [id number]   NDC: [address]-2

## 2019-11-11 ENCOUNTER — Encounter: Payer: Self-pay | Admitting: Neurology

## 2019-11-18 DIAGNOSIS — M9901 Segmental and somatic dysfunction of cervical region: Secondary | ICD-10-CM | POA: Diagnosis not present

## 2019-11-18 DIAGNOSIS — M9903 Segmental and somatic dysfunction of lumbar region: Secondary | ICD-10-CM | POA: Diagnosis not present

## 2019-11-18 DIAGNOSIS — M5416 Radiculopathy, lumbar region: Secondary | ICD-10-CM | POA: Diagnosis not present

## 2019-11-18 DIAGNOSIS — R519 Headache, unspecified: Secondary | ICD-10-CM | POA: Diagnosis not present

## 2019-11-19 ENCOUNTER — Other Ambulatory Visit (HOSPITAL_COMMUNITY)
Admission: RE | Admit: 2019-11-19 | Discharge: 2019-11-19 | Disposition: A | Discharge status: Home / Self Care | Payer: 59 | Source: Ambulatory Visit | Attending: Obstetrics and Gynecology | Admitting: Obstetrics and Gynecology

## 2019-11-19 ENCOUNTER — Other Ambulatory Visit: Payer: Self-pay | Admitting: Obstetrics and Gynecology

## 2019-11-19 ENCOUNTER — Other Ambulatory Visit: Payer: Self-pay

## 2019-11-19 ENCOUNTER — Ambulatory Visit (INDEPENDENT_AMBULATORY_CARE_PROVIDER_SITE_OTHER): Payer: 59 | Admitting: Obstetrics and Gynecology

## 2019-11-19 ENCOUNTER — Encounter: Payer: Self-pay | Admitting: Obstetrics and Gynecology

## 2019-11-19 VITALS — BP 126/82 | HR 86 | Ht 62.0 in | Wt 151.1 lb

## 2019-11-19 DIAGNOSIS — Z124 Encounter for screening for malignant neoplasm of cervix: Secondary | ICD-10-CM | POA: Diagnosis not present

## 2019-11-19 DIAGNOSIS — Z1231 Encounter for screening mammogram for malignant neoplasm of breast: Secondary | ICD-10-CM

## 2019-11-19 DIAGNOSIS — Z01419 Encounter for gynecological examination (general) (routine) without abnormal findings: Secondary | ICD-10-CM

## 2019-11-19 MED ORDER — FLUOXETINE HCL 20 MG PO CAPS: ORAL_CAPSULE | ORAL | 4 refills | Status: DC

## 2019-11-19 MED ORDER — NORETHINDRONE ACET-ETHINYL EST 1.5-30 MG-MCG PO TABS: 1.0000 | ORAL_TABLET | Freq: Every day | ORAL | 5 refills | Status: DC

## 2019-11-19 NOTE — Progress Notes (Signed)
HPI:      Ms. Carrie Nelson is a 39 y.o. G1P1001 who LMP was No LMP recorded. (Menstrual status: Oral contraceptives).  Subjective:   She presents today for her annual examination.  She takes continuous OCPs for birth control and cycle control.  She has no breakthrough bleeding. She takes Prozac and is stable on her current dose and would like to continue. She sees Dr. Graciela Husbands for her general medical care and lab work. She is up-to-date with mammography and is due for her next one in February.    Hx: The following portions of the patient's history were reviewed and updated as appropriate:             She  has a past medical history of GERD (gastroesophageal reflux disease), Hypothyroidism, Migraines, Multiple sclerosis (HCC), Syncope, Thyrotoxicosis, and Tremor. She does not have any pertinent problems on file. She  has a past surgical history that includes Knee arthroscopy w/ ACL reconstruction (Left, 2010); Bunionectomy (Left); Cesarean section (01/22/2014); Upper gastrointestinal endoscopy (05/07/2019); Esophageal manometry (N/A, 06/24/2019); 24 hour ph study (N/A, 06/24/2019); and PH impedance study (N/A, 06/24/2019). Her family history includes Arthritis in an other family member; Dementia in an other family member; Diabetes in her father; Esophageal cancer in her maternal grandfather; Heart disease in her paternal grandfather and another family member; Hypertension in an other family member; Kidney disease in her maternal grandmother; Prostate cancer in her father. She  reports that she has never smoked. She has never used smokeless tobacco. She reports current alcohol use. She reports that she does not use drugs. She has a current medication list which includes the following prescription(s): alprazolam, aspirin ec, cholecalciferol, esomeprazole, levothyroxine, methylphenidate, montelukast, multiple vitamin, norethindrone acetate-ethinyl estradiol, topiramate, xiidra, alemtuzumab, fluoxetine,  and saccharomyces boulardii. She has No Known Allergies.       Review of Systems:  Review of Systems  Constitutional: Denied constitutional symptoms, night sweats, recent illness, fatigue, fever, insomnia and weight loss.  Eyes: Denied eye symptoms, eye pain, photophobia, vision change and visual disturbance.  Ears/Nose/Throat/Neck: Denied ear, nose, throat or neck symptoms, hearing loss, nasal discharge, sinus congestion and sore throat.  Cardiovascular: Denied cardiovascular symptoms, arrhythmia, chest pain/pressure, edema, exercise intolerance, orthopnea and palpitations.  Respiratory: Denied pulmonary symptoms, asthma, pleuritic pain, productive sputum, cough, dyspnea and wheezing.  Gastrointestinal: Denied, gastro-esophageal reflux, melena, nausea and vomiting.  Genitourinary: Denied genitourinary symptoms including symptomatic vaginal discharge, pelvic relaxation issues, and urinary complaints.  Musculoskeletal: Denied musculoskeletal symptoms, stiffness, swelling, muscle weakness and myalgia.  Dermatologic: Denied dermatology symptoms, rash and scar.  Neurologic: Denied neurology symptoms, dizziness, headache, neck pain and syncope.  Psychiatric: Denied psychiatric symptoms, anxiety and depression.  Endocrine: Denied endocrine symptoms including hot flashes and night sweats.   Meds:   Current Outpatient Medications on File Prior to Visit  Medication Sig Dispense Refill  . ALPRAZolam (XANAX) 0.5 MG tablet Take 1 tablet (0.5 mg total) by mouth at bedtime as needed for anxiety. 90 tablet 2  . aspirin EC 81 MG tablet Take 81 mg by mouth daily.     . cholecalciferol (VITAMIN D) 1000 UNITS tablet Take 2,000 Units by mouth daily.     Marland Kitchen esomeprazole (NEXIUM) 40 MG capsule Take 1 capsule by mouth daily.    Marland Kitchen levothyroxine (SYNTHROID) 175 MCG tablet Take 1 tablet by mouth daily. Pt has to have brand name synthroid- generic causes itchy raised rash    . methylphenidate (CONCERTA) 36 MG PO CR  tablet TAKE 2  TABLETS BY MOUTH BY MOUTH DAILY AS NEEDED 90 tablet 0  . montelukast (SINGULAIR) 10 MG tablet Take 10 mg by mouth at bedtime.   3  . Multiple Vitamins-Minerals (MULTIVITAMIN PO) Take by mouth.    . topiramate (TOPAMAX) 50 MG tablet One or two po nightly (Patient taking differently: two po nightly) 180 tablet 3  . XIIDRA 5 % SOLN   6  . Alemtuzumab (LEMTRADA) 12 MG/1.2ML SOLN Inject 12 mg into the vein. (Patient not taking: Reported on 11/19/2019)    . saccharomyces boulardii (FLORASTOR) 250 MG capsule Take by mouth. (Patient not taking: Reported on 08/21/2019)     No current facility-administered medications on file prior to visit.    Objective:     Vitals:   11/19/19 1014  BP: 126/82  Pulse: 86              Physical examination General NAD, Conversant  HEENT Atraumatic; Op clear with mmm.  Normo-cephalic. Pupils reactive. Anicteric sclerae  Thyroid/Neck Smooth without nodularity or enlargement. Normal ROM.  Neck Supple.  Skin No rashes, lesions or ulceration. Normal palpated skin turgor. No nodularity.  Breasts: No masses or discharge.  Symmetric.  No axillary adenopathy.  Lungs: Clear to auscultation.No rales or wheezes. Normal Respiratory effort, no retractions.  Heart: NSR.  No murmurs or rubs appreciated. No periferal edema  Abdomen: Soft.  Non-tender.  No masses.  No HSM. No hernia  Extremities: Moves all appropriately.  Normal ROM for age. No lymphadenopathy.  Neuro: Oriented to PPT.  Normal mood. Normal affect.     Pelvic:   Vulva: Normal appearance.  No lesions.  Vagina: No lesions or abnormalities noted.  Support: Normal pelvic support.  Urethra No masses tenderness or scarring.  Meatus Normal size without lesions or prolapse.  Cervix: Normal appearance.  No lesions.  Anus: Normal exam.  No lesions.  Perineum: Normal exam.  No lesions.        Bimanual   Uterus: Normal size.  Non-tender.  Mobile.  AV.  Adnexae: No masses.  Non-tender to palpation.   Cul-de-sac: Negative for abnormality.      Assessment:    G1P1001 Patient Active Problem List   Diagnosis Date Noted  . Gastroesophageal reflux disease   . Globus sensation   . Common migraine without intractability 07/02/2018  . Seasonal allergies 09/19/2017  . Hyperthyroidism 06/24/2017  . Chronic venous insufficiency 05/10/2016  . 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 Pain in limb 04/02/2016  . Type 2 diabetes mellitus (HCC) 04/02/2016  . High risk medication use 10/18/2015  . Constipation, chronic 08/18/2015  . Asthma, exogenous 01/31/2015  . Anxiety 01/31/2015  . History of knee problem 01/31/2015  . H/O thyroid disease 01/31/2015  . Allergic rhinitis, seasonal 01/31/2015  . Gait disturbance 07/08/2014  . Other fatigue 07/08/2014  . Attention deficit disorder 07/08/2014  . Nonspecific abnormal results of other specified function study 09/06/2012  . Dysarthria 09/06/2012  . Knee Pain, Left  09/06/2012  . Multiple sclerosis (HCC) 09/06/2012  . Female genuine stress incontinence 11/06/2011  . FOM (frequency of micturition) 11/06/2011  . Chronic cystitis 11/06/2011  . Gross hematuria 11/06/2011     1. Well woman exam with routine gynecological exam   2. Encounter for screening mammogram for malignant neoplasm of breast     Patient doing well on current medical regimen.   Plan:            1.  Basic Screening Recommendations  The basic screening recommendations for asymptomatic women were discussed with the patient during her visit.  The age-appropriate recommendations were discussed with her and the rational for the tests reviewed.  When I am informed by the patient that another primary care physician has previously obtained the age-appropriate tests and they are up-to-date, only outstanding tests are ordered and referrals given as necessary.  Abnormal results of tests will be discussed with her when all of her results are  completed.  Routine preventative health maintenance measures emphasized: Exercise/Diet/Weight control, Tobacco Warnings, Alcohol/Substance use risks and Stress Management Pap performed-mammogram ordered 2.  Continue OCPs 3.  Continue Prozac as prescribed. Orders Orders Placed This Encounter  Procedures  . MM 3D SCREEN BREAST BILATERAL     Meds ordered this encounter  Medications  . Norethindrone Acetate-Ethinyl Estradiol (LARIN 1.5/30) 1.5-30 MG-MCG tablet    Sig: Take 1 tablet by mouth daily.    Dispense:  63 tablet    Refill:  5  . FLUoxetine (PROZAC) 20 MG capsule    Sig: Pt is taking 20mg  daily    Dispense:  90 capsule    Refill:  4           F/U  Return in about 1 year (around 11/18/2020) for Annual Physical.  11/20/2020, M.D. 11/19/2019 10:50 AM

## 2019-11-19 NOTE — Addendum Note (Signed)
Addended by: Dorian Pod on: 11/19/2019 11:35 AM   Modules accepted: Orders

## 2019-11-19 NOTE — Addendum Note (Signed)
Addended by: Dorian Pod on: 11/19/2019 03:25 PM   Modules accepted: Orders

## 2019-11-23 ENCOUNTER — Other Ambulatory Visit: Payer: Self-pay | Admitting: Neurology

## 2019-11-24 LAB — CYTOLOGY - PAP
Comment: NEGATIVE
Diagnosis: NEGATIVE
High risk HPV: NEGATIVE

## 2019-12-07 ENCOUNTER — Other Ambulatory Visit: Payer: Self-pay | Admitting: Internal Medicine

## 2019-12-10 ENCOUNTER — Telehealth: Payer: Self-pay

## 2019-12-10 NOTE — Telephone Encounter (Signed)
Patient called in stating that she needs a refill on her birth control, can you please advise?

## 2019-12-11 NOTE — Telephone Encounter (Signed)
LM for patient to return call. I spoke with pharmacy and they stated that patient picked up three packs on 11/19/2019. She still has 5 refills left. Left this on her voicemail and if she has any questions to give [place] a call.

## 2019-12-14 ENCOUNTER — Encounter: Payer: Self-pay | Admitting: Neurology

## 2019-12-15 ENCOUNTER — Other Ambulatory Visit: Payer: Self-pay

## 2019-12-15 ENCOUNTER — Other Ambulatory Visit (HOSPITAL_COMMUNITY)
Admission: RE | Admit: 2019-12-15 | Discharge: 2019-12-15 | Disposition: A | Discharge status: Home / Self Care | Payer: 59 | Source: Ambulatory Visit | Attending: General Surgery | Admitting: General Surgery

## 2019-12-15 ENCOUNTER — Encounter (HOSPITAL_COMMUNITY)
Admission: RE | Admit: 2019-12-15 | Discharge: 2019-12-15 | Disposition: A | Discharge status: Home / Self Care | Payer: 59 | Source: Ambulatory Visit | Attending: General Surgery | Admitting: General Surgery

## 2019-12-15 ENCOUNTER — Encounter (HOSPITAL_COMMUNITY): Payer: Self-pay

## 2019-12-15 ENCOUNTER — Telehealth: Payer: Self-pay | Admitting: *Deleted

## 2019-12-15 ENCOUNTER — Other Ambulatory Visit: Payer: Self-pay | Admitting: Gastroenterology

## 2019-12-15 DIAGNOSIS — Z20822 Contact with and (suspected) exposure to covid-19: Secondary | ICD-10-CM | POA: Insufficient documentation

## 2019-12-15 DIAGNOSIS — Z01812 Encounter for preprocedural laboratory examination: Secondary | ICD-10-CM | POA: Diagnosis not present

## 2019-12-15 DIAGNOSIS — K219 Gastro-esophageal reflux disease without esophagitis: Secondary | ICD-10-CM

## 2019-12-15 DIAGNOSIS — Z8639 Personal history of other endocrine, nutritional and metabolic disease: Secondary | ICD-10-CM | POA: Diagnosis not present

## 2019-12-15 DIAGNOSIS — K449 Diaphragmatic hernia without obstruction or gangrene: Secondary | ICD-10-CM

## 2019-12-15 HISTORY — DX: Nausea with vomiting, unspecified: R11.2

## 2019-12-15 HISTORY — DX: Other complications of anesthesia, initial encounter: T88.59XA

## 2019-12-15 HISTORY — DX: Other specified postprocedural states: Z98.890

## 2019-12-15 LAB — SARS CORONAVIRUS 2 (TAT 6-24 HRS): SARS Coronavirus 2: NEGATIVE

## 2019-12-15 LAB — COMPREHENSIVE METABOLIC PANEL
ALT: 12 U/L (ref 0–44)
AST: 14 U/L — ABNORMAL LOW (ref 15–41)
Albumin: 4.1 g/dL (ref 3.5–5.0)
Alkaline Phosphatase: 44 U/L (ref 38–126)
Anion gap: 12 (ref 5–15)
BUN: 15 mg/dL (ref 6–20)
CO2: 16 mmol/L — ABNORMAL LOW (ref 22–32)
Calcium: 9 mg/dL (ref 8.9–10.3)
Chloride: 111 mmol/L (ref 98–111)
Creatinine, Ser: 0.83 mg/dL (ref 0.44–1.00)
GFR, Estimated: >60 mL/min (ref >60)
Glucose, Bld: 136 mg/dL — ABNORMAL HIGH (ref 70–99)
Potassium: 3.5 mmol/L (ref 3.5–5.1)
Sodium: 139 mmol/L (ref 135–145)
Total Bilirubin: 0.8 mg/dL (ref 0.3–1.2)
Total Protein: 6.9 g/dL (ref 6.5–8.1)

## 2019-12-15 LAB — CBC WITH DIFFERENTIAL/PLATELET
Abs Immature Granulocytes: 0.01 10*3/uL (ref 0.00–0.07)
Basophils Absolute: 0.1 10*3/uL (ref 0.0–0.1)
Basophils Relative: 1 %
Eosinophils Absolute: 0.1 10*3/uL (ref 0.0–0.5)
Eosinophils Relative: 1 %
HCT: 39.2 % (ref 36.0–46.0)
Hemoglobin: 13.1 g/dL (ref 12.0–15.0)
Immature Granulocytes: 0 %
Lymphocytes Relative: 32 %
Lymphs Abs: 1.6 10*3/uL (ref 0.7–4.0)
MCH: 30.2 pg (ref 26.0–34.0)
MCHC: 33.4 g/dL (ref 30.0–36.0)
MCV: 90.3 fL (ref 80.0–100.0)
Monocytes Absolute: 0.5 10*3/uL (ref 0.1–1.0)
Monocytes Relative: 10 %
Neutro Abs: 2.8 10*3/uL (ref 1.7–7.7)
Neutrophils Relative %: 56 %
Platelets: 350 10*3/uL (ref 150–400)
RBC: 4.34 MIL/uL (ref 3.87–5.11)
RDW: 12.3 % (ref 11.5–15.5)
WBC: 5 10*3/uL (ref 4.0–10.5)
nRBC: 0 % (ref 0.0–0.2)

## 2019-12-15 NOTE — Telephone Encounter (Addendum)
Called and spoke with pt. Monthly lemtrada labs drawn 12/14/19 showed TSH of 0.032 (low)(ref range: 0.450-4.500) Pt states endocrinologist released her back to PCP to management of thyroid. She still takes synthroid po qd. She will follow up with PCP on abnormal labs. Confirmed PCP Daniel Nones, MD. I faxed results to his attn at [phone]. Received fax confirmation.

## 2019-12-15 NOTE — Progress Notes (Addendum)
COVID Vaccine Completed: Yes Date COVID Vaccine completed:10/23/19 [place].  COVID vaccine manufacturer: Pfizer      PCP - Dr. Curtis Sites. LOV: 10/22/19 Cardiologist -   Chest x-ray -  EKG -  Stress Test -  ECHO -  Cardiac Cath -  Pacemaker/ICD device last checked:  Sleep Study -  CPAP -   Fasting Blood Sugar -  Checks Blood Sugar _____ times a day  Blood Thinner Instructions: Aspirin Instructions: Last Dose:  Anesthesia review:   Patient denies shortness of breath, fever, cough and chest pain at PAT appointment   Patient verbalized understanding of instructions that were given to them at the PAT appointment. Patient was also instructed that they will need to review over the PAT instructions again at home before surgery.

## 2019-12-15 NOTE — Patient Instructions (Signed)
DUE TO COVID-19 ONLY ONE VISITOR IS ALLOWED TO COME WITH YOU AND STAY IN THE WAITING ROOM ONLY DURING PRE OP AND PROCEDURE DAY OF SURGERY. THE 1 VISITOR  MAY VISIT WITH YOU AFTER SURGERY IN YOUR PRIVATE ROOM DURING VISITING HOURS ONLY!  YOU NEED TO HAVE A COVID 19 TEST ON: 12/15/19 @ 1:00 PM, THIS TEST MUST BE DONE BEFORE SURGERY,  COVID TESTING SITE [address] [place] [address], IT IS ON THE RIGHT GOING OUT [address] APPROXIMATELY  2 MINUTES PAST [employer] ON THE RIGHT. ONCE YOUR COVID TEST IS COMPLETED,  PLEASE BEGIN THE QUARANTINE INSTRUCTIONS AS OUTLINED IN YOUR HANDOUT.                Carrie Nelson    Your procedure is scheduled on: 12/18/19   Report to [facility] Main  Entrance   Report to short stay at: 5:30 AM     Call this number if you have problems the morning of surgery [phone]    Remember:   NO SOLID FOOD AFTER MIDNIGHT THE NIGHT PRIOR TO SURGERY. NOTHING BY MOUTH EXCEPT CLEAR LIQUIDS UNTIL: 4:30 AM . PLEASE FINISH ENSURE DRINK PER SURGEON ORDER  WHICH NEEDS TO BE COMPLETED AT : 4:30 AM.  CLEAR LIQUID DIET   Foods Allowed                                                                     Foods Excluded  Coffee and tea, regular and decaf                             liquids that you cannot  Plain Jell-O any favor except red or purple                                           see through such as: Fruit ices (not with fruit pulp)                                     milk, soups, orange juice  Iced Popsicles                                    All solid food Carbonated beverages, regular and diet                                    Cranberry, grape and apple juices Sports drinks like Gatorade Lightly seasoned clear broth or consume(fat free) Sugar, honey syrup  Sample Menu Breakfast                                Lunch  Supper Cranberry juice                    Beef broth                             Chicken broth Jell-O                                     Grape juice                           Apple juice Coffee or tea                        Jell-O                                      Popsicle                                                Coffee or tea                        Coffee or tea  _____________________________________________________________________   BRUSH YOUR TEETH MORNING OF SURGERY AND RINSE YOUR MOUTH OUT, NO CHEWING GUM CANDY OR MINTS.     Take these medicines the morning of surgery with A SIP OF WATER: Fluoxetine,topomax,nexium,synthroid,loratadine.Use eye drops as usual.                                You may not have any metal on your body including hair pins and              piercings  Do not wear jewelry, make-up, lotions, powders or perfumes, deodorant             Do not wear nail polish on your fingernails.  Do not shave  48 hours prior to surgery.            Do not bring valuables to the hospital. [facility] IS NOT             RESPONSIBLE   FOR VALUABLES.  Contacts, dentures or bridgework may not be worn into surgery.  Leave suitcase in the car. After surgery it may be brought to your room.     Patients discharged the day of surgery will not be allowed to drive home. IF YOU ARE HAVING SURGERY AND GOING HOME THE SAME DAY, YOU MUST HAVE AN ADULT TO DRIVE YOU HOME AND BE WITH YOU FOR 24 HOURS. YOU MAY GO HOME BY TAXI OR UBER OR ORTHERWISE, BUT AN ADULT MUST ACCOMPANY YOU HOME AND STAY WITH YOU FOR 24 HOURS.  Name and phone number of your driver:  Special Instructions: N/A              Please read over the following fact sheets you were given: _____________________________________________________________________         [facility] - Preparing for Surgery Before surgery, you can play an important role.  Because skin is not sterile,  your skin needs to be as free of germs as possible.  You can reduce the number of germs on your skin by washing with CHG  (chlorahexidine gluconate) soap before surgery.  CHG is an antiseptic cleaner which kills germs and bonds with the skin to continue killing germs even after washing. Please DO NOT use if you have an allergy to CHG or antibacterial soaps.  If your skin becomes reddened/irritated stop using the CHG and inform your nurse when you arrive at Short Stay. Do not shave (including legs and underarms) for at least 48 hours prior to the first CHG shower.  You may shave your face/neck. Please follow these instructions carefully:  1.  Shower with CHG Soap the night before surgery and the  morning of Surgery.  2.  If you choose to wash your hair, wash your hair first as usual with your  normal  shampoo.  3.  After you shampoo, rinse your hair and body thoroughly to remove the  shampoo.                           4.  Use CHG as you would any other liquid soap.  You can apply chg directly  to the skin and wash                       Gently with a scrungie or clean washcloth.  5.  Apply the CHG Soap to your body ONLY FROM THE NECK DOWN.   Do not use on face/ open                           Wound or open sores. Avoid contact with eyes, ears mouth and genitals (private parts).                       Wash face,  Genitals (private parts) with your normal soap.             6.  Wash thoroughly, paying special attention to the area where your surgery  will be performed.  7.  Thoroughly rinse your body with warm water from the neck down.  8.  DO NOT shower/wash with your normal soap after using and rinsing off  the CHG Soap.                9.  Pat yourself dry with a clean towel.            10.  Wear clean pajamas.            11.  Place clean sheets on your bed the night of your first shower and do not  sleep with pets. Day of Surgery : Do not apply any lotions/deodorants the morning of surgery.  Please wear clean clothes to the hospital/surgery center.  FAILURE TO FOLLOW THESE INSTRUCTIONS MAY RESULT IN THE CANCELLATION OF  YOUR SURGERY PATIENT SIGNATURE_________________________________  NURSE SIGNATURE__________________________________  ________________________________________________________________________

## 2019-12-16 DIAGNOSIS — M5416 Radiculopathy, lumbar region: Secondary | ICD-10-CM | POA: Diagnosis not present

## 2019-12-16 DIAGNOSIS — R519 Headache, unspecified: Secondary | ICD-10-CM | POA: Diagnosis not present

## 2019-12-16 DIAGNOSIS — M9901 Segmental and somatic dysfunction of cervical region: Secondary | ICD-10-CM | POA: Diagnosis not present

## 2019-12-16 DIAGNOSIS — M9903 Segmental and somatic dysfunction of lumbar region: Secondary | ICD-10-CM | POA: Diagnosis not present

## 2019-12-17 ENCOUNTER — Other Ambulatory Visit: Payer: Self-pay | Admitting: Internal Medicine

## 2019-12-17 NOTE — Anesthesia Preprocedure Evaluation (Addendum)
Anesthesia Evaluation  Patient identified by MRN, date of birth, ID band Patient awake    Reviewed: Allergy & Precautions, NPO status , Patient's Chart, lab work & pertinent test results  History of Anesthesia Complications (+) PONV and history of anesthetic complications  Airway Mallampati: II  TM Distance: >3 FB Neck ROM: Full    Dental no notable dental hx. (+) Teeth Intact, Dental Advisory Given   Pulmonary    Pulmonary exam normal breath sounds clear to auscultation       Cardiovascular negative cardio ROS Normal cardiovascular exam Rhythm:Regular Rate:Normal     Neuro/Psych  Headaches, PSYCHIATRIC DISORDERS Anxiety Multiple sclerosis  Neuromuscular disease    GI/Hepatic Neg liver ROS, hiatal hernia, GERD  Medicated and Controlled,  Endo/Other  neg diabetesHypothyroidism   Renal/GU negative Renal ROS  negative genitourinary   Musculoskeletal negative musculoskeletal ROS (+)   Abdominal Normal abdominal exam  (+)   Peds  Hematology negative hematology ROS (+)   Anesthesia Other Findings   Reproductive/Obstetrics negative OB ROS                            Anesthesia Physical Anesthesia Plan  ASA: II  Anesthesia Plan: General   Post-op Pain Management:    Induction: Intravenous, Rapid sequence and Cricoid pressure planned  PONV Risk Score and Plan: Ondansetron, Dexamethasone, Midazolam, Treatment may vary due to age or medical condition, Scopolamine patch - Pre-op and Propofol infusion  Airway Management Planned: Oral ETT  Additional Equipment: None  Intra-op Plan:   Post-operative Plan: Extubation in OR  Informed Consent: I have reviewed the patients History and Physical, chart, labs and discussed the procedure including the risks, benefits and alternatives for the proposed anesthesia with the patient or authorized representative who has indicated his/her understanding and  acceptance.     Dental advisory given  Plan Discussed with: CRNA  Anesthesia Plan Comments:       Anesthesia Quick Evaluation

## 2019-12-17 NOTE — H&P (Signed)
CC: here for surgery  Requesting provider: n/a  HPI: Carrie Nelson is an 39 y.o. female who is here for combined laparoscopic repair of hiatal hernia with TIF by Dr Barron Alvine. She denies any changes since seen in clinic in July other than thyroid medication changes.   The patient is a 39 year old female who presents for an evaluation of a hernia. She is referred by Dr Barron Alvine to discuss concomitant lap hiatal hernia repair with TIF. She states that her symptoms began about a year ago. She initially started having episodes where she felt she was having to constantly clear her throat. She initially thought it was sinus drainage causing irritation. Her husband is a Engineer, civil (consulting). She initially tried Mucinex but that didn't help. Her husband thought it may be heartburn related since she started taking some Nexium with no improvement. She ended up seeing [facility] and they performed a laryngoscopy by report it showed some reflux irritation on the vocal cords. She chin tried several months of Nexium. She states that it helped a little bit but she was still having to clear her throat. She denies any heartburn. She denies any early satiety. She denies any sour taste in the back for mouth. She denies any burning in her chest. She denies any regurgitation of solid food. She denies any pain with swallowing solids or liquids. She ended up seeing Dr. Margretta Sidle and ended up undergoing an upper endoscopy which is described below which I personally reviewed as well. She then underwent esophageal manometry and pH studies which I reviewed as well. I also reviewed both gastroenterologist office notes as well as the note from her neurologist. She has multiple sclerosis. She is currently not taking the lemtrada. It some medicine that she take about once a year and only repeat if have a flare  ENT did laryngoscopy in [place] in 01/2019 with e/o reflux irritation. Started Nexium 40 mg x6 weeks, witout  appreciable improvement.  GERD history: -Index symptoms: Regurgitation, increased throat clearing, globus sensation. Increased nocturnal throat clearing. No dysphagia or heartburn -Exacerbating factors: Increased postprandial symptoms -Medications trialed: Nexium -Current medications: Nexium 40 mg/day -Complications: LPR, LES laxity with small HH  GERD evaluation: -Last EGD: 05/07/2019. Images reviewed by me today -Barium esophagram: None -Esophageal Manometry: Normal, no HH -pH/Impedance (on PPI): Good acid control with DeMeester 2.2, increased reflux events with increased esophageal bolus exposure with predominantly weakly acid reflux (95 events with 92 being weakly acid) -recommended consideration of antireflux surgery  Endoscopic History: -EGD (04/27/2019, Dr. 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 Complication of anesthesia   . GERD (gastroesophageal reflux disease)   . Hypothyroidism   . Migraines   . Multiple sclerosis (HCC)   . PONV (postoperative nausea and vomiting)   . Syncope   . Thyrotoxicosis   . Tremor     Past Surgical History:  Procedure Laterality Date  . 24 HOUR PH STUDY N/A 06/24/2019   Procedure: 24 HOUR PH STUDY;  Surgeon: Napoleon Form, MD;  Location: [facility];  Service: Endoscopy;  Laterality: N/A;  . BUNIONECTOMY Left   . CESAREAN SECTION  01/22/2014  . ESOPHAGEAL MANOMETRY N/A 06/24/2019   Procedure: ESOPHAGEAL MANOMETRY (EM);  Surgeon: Napoleon Form, MD;  Location: [facility];  Service: Endoscopy;  Laterality: N/A;  . KNEE ARTHROSCOPY W/ ACL RECONSTRUCTION Left 2010  . PH IMPEDANCE  STUDY N/A 06/24/2019   Procedure: PH IMPEDANCE STUDY;  Surgeon: Napoleon Form, MD;  Location: [facility];  Service: Endoscopy;  Laterality: N/A;  . UPPER GASTROINTESTINAL ENDOSCOPY  05/07/2019    Family History  Problem  Relation Age of Onset  . Prostate cancer Father   . Diabetes Father   . Arthritis Other   . Dementia Other   . Hypertension Other   . Heart disease Other   . Kidney disease Maternal Grandmother   . Esophageal cancer Maternal Grandfather   . Heart disease Paternal Grandfather   . Colon cancer Neg Hx   . Rectal cancer Neg Hx   . Stomach cancer Neg Hx     Social:  reports that she has never smoked. She has never used smokeless tobacco. She reports current alcohol use. She reports that she does not use drugs.  Allergies: No Known Allergies  Medications: I have reviewed the patient's current medications.  No results found for this or any previous visit (from the past 48 hour(s)).  No results found.  ROS - all of the below systems have been reviewed with the patient and positives are indicated with bold text General: chills, fever or night sweats Eyes: blurry vision or double vision ENT: epistaxis or sore throat Allergy/Immunology: itchy/watery eyes or nasal congestion Hematologic/Lymphatic: bleeding problems, blood clots or swollen lymph nodes Endocrine: temperature intolerance or unexpected weight changes Breast: new or changing breast lumps or nipple discharge Resp: cough, shortness of breath, or wheezing CV: chest pain or dyspnea on exertion GI: as per HPI GU: dysuria, trouble voiding, or hematuria MSK: joint pain or joint stiffness Neuro: TIA or stroke symptoms Derm: pruritus and skin lesion changes Psych: anxiety and depression  PE unknown if currently breastfeeding. Constitutional: NAD; conversant; no deformities Eyes: Moist conjunctiva; no lid lag; anicteric; PERRL Neck: Trachea midline; no thyromegaly Lungs: Normal respiratory effort; no tactile fremitus CV: RRR; no palpable thrills; no pitting edema GI: Abd soft, nontender; no palpable hepatosplenomegaly MSK: Normal gait; no clubbing/cyanosis Psychiatric: Appropriate affect; alert and oriented x3 Lymphatic: No  palpable cervical or axillary lymphadenopathy Skin:no rash/lesions/induration  No results found for this or any previous visit (from the past 48 hour(s)).  No results found.  Imaging: Previously reviewed  A/P: Carrie Nelson is an 39 y.o. female with Small Hiatal hernia with GERD  To OR with Dr Barron Alvine for combined TIF and lap HHR ERAS protocol subcu heparin IV abx All questions asked and answered Risk/benefits/postop plan/issues extensively discussed with pt before and no new questions.    Mary Sella. Andrey Campanile, MD, FACS General, Bariatric, & Minimally Invasive Surgery [facility], [place]

## 2019-12-18 ENCOUNTER — Ambulatory Visit: Admit: 2019-12-18 | Payer: 59 | Admitting: General Surgery

## 2019-12-18 ENCOUNTER — Ambulatory Visit (HOSPITAL_COMMUNITY): Payer: 59 | Admitting: Anesthesiology

## 2019-12-18 ENCOUNTER — Encounter (HOSPITAL_COMMUNITY): Payer: Self-pay | Admitting: General Surgery

## 2019-12-18 ENCOUNTER — Encounter (HOSPITAL_COMMUNITY)
Admission: RE | Disposition: A | Discharge status: Home / Self Care | Payer: Self-pay | Source: Home / Self Care | Attending: General Surgery

## 2019-12-18 ENCOUNTER — Ambulatory Visit (HOSPITAL_COMMUNITY)
Admission: RE | Admit: 2019-12-18 | Discharge: 2019-12-18 | Disposition: A | Discharge status: Home / Self Care | Payer: 59 | Attending: General Surgery | Admitting: General Surgery

## 2019-12-18 DIAGNOSIS — K2100 Gastro-esophageal reflux disease with esophagitis, without bleeding: Secondary | ICD-10-CM

## 2019-12-18 DIAGNOSIS — E0590 Thyrotoxicosis, unspecified without thyrotoxic crisis or storm: Secondary | ICD-10-CM | POA: Diagnosis not present

## 2019-12-18 DIAGNOSIS — Z9889 Other specified postprocedural states: Secondary | ICD-10-CM

## 2019-12-18 DIAGNOSIS — K449 Diaphragmatic hernia without obstruction or gangrene: Secondary | ICD-10-CM | POA: Diagnosis not present

## 2019-12-18 DIAGNOSIS — E119 Type 2 diabetes mellitus without complications: Secondary | ICD-10-CM | POA: Diagnosis not present

## 2019-12-18 DIAGNOSIS — K219 Gastro-esophageal reflux disease without esophagitis: Secondary | ICD-10-CM | POA: Insufficient documentation

## 2019-12-18 DIAGNOSIS — G35 Multiple sclerosis: Secondary | ICD-10-CM | POA: Diagnosis not present

## 2019-12-18 DIAGNOSIS — G43909 Migraine, unspecified, not intractable, without status migrainosus: Secondary | ICD-10-CM | POA: Diagnosis not present

## 2019-12-18 DIAGNOSIS — Z8719 Personal history of other diseases of the digestive system: Secondary | ICD-10-CM

## 2019-12-18 HISTORY — DX: Personal history of other diseases of the digestive system: Z87.19

## 2019-12-18 HISTORY — PX: TRANSORAL INCISIONLESS FUNDOPLICATION: SHX6840

## 2019-12-18 HISTORY — PX: ESOPHAGOGASTRODUODENOSCOPY: SHX5428

## 2019-12-18 HISTORY — DX: Other specified postprocedural states: Z98.890

## 2019-12-18 HISTORY — PX: HIATAL HERNIA REPAIR: SHX195

## 2019-12-18 LAB — TYPE AND SCREEN
ABO/RH(D): AB POS
Antibody Screen: NEGATIVE

## 2019-12-18 LAB — PREGNANCY, URINE: Preg Test, Ur: NEGATIVE

## 2019-12-18 LAB — ABO/RH: ABO/RH(D): AB POS

## 2019-12-18 SURGERY — REPAIR, HERNIA, HIATAL, LAPAROSCOPIC
Anesthesia: General

## 2019-12-18 MED ORDER — SUCCINYLCHOLINE CHLORIDE 20 MG/ML IJ SOLN
INTRAMUSCULAR | Status: DC | PRN
  Administered 2019-12-18: 80 mg via INTRAVENOUS

## 2019-12-18 MED ORDER — PROMETHAZINE HCL 25 MG/ML IJ SOLN: 6.2500 mg | INTRAMUSCULAR | Status: DC | PRN

## 2019-12-18 MED ORDER — ORAL CARE MOUTH RINSE: 15.0000 mL | Freq: Once | OROMUCOSAL | Status: DC

## 2019-12-18 MED ORDER — EPHEDRINE 5 MG/ML INJ
INTRAVENOUS | Status: AC
  Filled 2019-12-18: qty 10

## 2019-12-18 MED ORDER — EPHEDRINE SULFATE-NACL 50-0.9 MG/10ML-% IV SOSY
PREFILLED_SYRINGE | INTRAVENOUS | Status: DC | PRN
  Administered 2019-12-18: 5 mg via INTRAVENOUS
  Administered 2019-12-18 (×2): 10 mg via INTRAVENOUS

## 2019-12-18 MED ORDER — ACETAMINOPHEN 500 MG PO TABS: 1000.0000 mg | ORAL_TABLET | Freq: Once | ORAL | Status: DC

## 2019-12-18 MED ORDER — ROCURONIUM BROMIDE 10 MG/ML (PF) SYRINGE
PREFILLED_SYRINGE | INTRAVENOUS | Status: AC
  Filled 2019-12-18: qty 10

## 2019-12-18 MED ORDER — SCOPOLAMINE 1 MG/3DAYS TD PT72: 1.0000 | MEDICATED_PATCH | TRANSDERMAL | 12 refills | Status: DC

## 2019-12-18 MED ORDER — ESOMEPRAZOLE MAGNESIUM 40 MG PO CPDR
40.0000 mg | DELAYED_RELEASE_CAPSULE | Freq: Two times a day (BID) | ORAL | 1 refills | Status: DC
Start: 2019-12-18 — End: 2020-01-04

## 2019-12-18 MED ORDER — DEXAMETHASONE SODIUM PHOSPHATE 10 MG/ML IJ SOLN
INTRAMUSCULAR | Status: AC
  Filled 2019-12-18: qty 1

## 2019-12-18 MED ORDER — ONDANSETRON HCL 4 MG PO TABS: 4.0000 mg | ORAL_TABLET | ORAL | 1 refills | Status: DC | PRN

## 2019-12-18 MED ORDER — LIDOCAINE 2% (20 MG/ML) 5 ML SYRINGE
INTRAMUSCULAR | Status: AC
  Filled 2019-12-18: qty 10

## 2019-12-18 MED ORDER — LIDOCAINE 2% (20 MG/ML) 5 ML SYRINGE
INTRAMUSCULAR | Status: DC | PRN
  Administered 2019-12-18: 15 mg/kg/h via INTRAVENOUS

## 2019-12-18 MED ORDER — AMISULPRIDE (ANTIEMETIC) 5 MG/2ML IV SOLN: 10.0000 mg | Freq: Once | INTRAVENOUS | Status: DC | PRN

## 2019-12-18 MED ORDER — MIDAZOLAM HCL 2 MG/2ML IJ SOLN
INTRAMUSCULAR | Status: AC
  Filled 2019-12-18: qty 2

## 2019-12-18 MED ORDER — DEXAMETHASONE SODIUM PHOSPHATE 4 MG/ML IJ SOLN: 4.0000 mg | INTRAMUSCULAR | Status: DC

## 2019-12-18 MED ORDER — ONDANSETRON HCL 4 MG/2ML IJ SOLN
INTRAMUSCULAR | Status: DC | PRN
  Administered 2019-12-18: 4 mg via INTRAVENOUS

## 2019-12-18 MED ORDER — FENTANYL CITRATE (PF) 250 MCG/5ML IJ SOLN
INTRAMUSCULAR | Status: AC
  Filled 2019-12-18: qty 5

## 2019-12-18 MED ORDER — PHENYLEPHRINE HCL (PRESSORS) 10 MG/ML IV SOLN
INTRAVENOUS | Status: AC
  Filled 2019-12-18: qty 1

## 2019-12-18 MED ORDER — OXYCODONE HCL 5 MG PO TABS: 5.0000 mg | ORAL_TABLET | Freq: Once | ORAL | Status: DC | PRN

## 2019-12-18 MED ORDER — PROPOFOL 10 MG/ML IV BOLUS
INTRAVENOUS | Status: DC | PRN
  Administered 2019-12-18: 150 mg via INTRAVENOUS

## 2019-12-18 MED ORDER — BUPIVACAINE HCL 0.25 % IJ SOLN
INTRAMUSCULAR | Status: AC
  Filled 2019-12-18: qty 1

## 2019-12-18 MED ORDER — DEXAMETHASONE SODIUM PHOSPHATE 4 MG/ML IJ SOLN
INTRAMUSCULAR | Status: DC | PRN
  Administered 2019-12-18: 8 mg via INTRAVENOUS

## 2019-12-18 MED ORDER — CHLORHEXIDINE GLUCONATE CLOTH 2 % EX PADS: 6.0000 | MEDICATED_PAD | Freq: Once | CUTANEOUS | Status: DC

## 2019-12-18 MED ORDER — ONDANSETRON HCL 4 MG/2ML IJ SOLN
INTRAMUSCULAR | Status: AC
  Filled 2019-12-18: qty 2

## 2019-12-18 MED ORDER — FENTANYL CITRATE (PF) 100 MCG/2ML IJ SOLN
INTRAMUSCULAR | Status: DC | PRN
  Administered 2019-12-18: 100 ug via INTRAVENOUS

## 2019-12-18 MED ORDER — ROCURONIUM BROMIDE 10 MG/ML (PF) SYRINGE
PREFILLED_SYRINGE | INTRAVENOUS | Status: DC | PRN
  Administered 2019-12-18: 50 mg via INTRAVENOUS
  Administered 2019-12-18: 10 mg via INTRAVENOUS

## 2019-12-18 MED ORDER — BUPIVACAINE LIPOSOME 1.3 % IJ SUSP
20.0000 mL | Freq: Once | INTRAMUSCULAR | Status: AC
  Administered 2019-12-18: 20 mL
  Filled 2019-12-18: qty 20

## 2019-12-18 MED ORDER — OXYCODONE HCL 5 MG PO TABS
5.0000 mg | ORAL_TABLET | Freq: Four times a day (QID) | ORAL | 0 refills | Status: DC | PRN
Start: 2019-12-18 — End: 2020-01-04

## 2019-12-18 MED ORDER — METOCLOPRAMIDE HCL 10 MG PO TABS: 10.0000 mg | ORAL_TABLET | Freq: Three times a day (TID) | ORAL | 1 refills | Status: DC | PRN

## 2019-12-18 MED ORDER — LACTATED RINGERS IV SOLN: INTRAVENOUS | Status: DC

## 2019-12-18 MED ORDER — SODIUM CHLORIDE (PF) 0.9 % IJ SOLN
INTRAMUSCULAR | Status: DC | PRN
  Administered 2019-12-18: 50 mL

## 2019-12-18 MED ORDER — OXYCODONE HCL 5 MG PO TABS
5.0000 mg | ORAL_TABLET | Freq: Four times a day (QID) | ORAL | 0 refills | Status: DC | PRN
Start: 2019-12-18 — End: 2019-12-18

## 2019-12-18 MED ORDER — PHENYLEPHRINE HCL-NACL 10-0.9 MG/250ML-% IV SOLN
INTRAVENOUS | Status: DC | PRN
  Administered 2019-12-18: 50 ug/min via INTRAVENOUS

## 2019-12-18 MED ORDER — GABAPENTIN 300 MG PO CAPS
300.0000 mg | ORAL_CAPSULE | ORAL | Status: AC
  Administered 2019-12-18: 300 mg via ORAL
  Filled 2019-12-18: qty 1

## 2019-12-18 MED ORDER — LIDOCAINE 2% (20 MG/ML) 5 ML SYRINGE
INTRAMUSCULAR | Status: AC
  Filled 2019-12-18: qty 5

## 2019-12-18 MED ORDER — SCOPOLAMINE 1 MG/3DAYS TD PT72
1.0000 | MEDICATED_PATCH | TRANSDERMAL | Status: DC
  Filled 2019-12-18: qty 1

## 2019-12-18 MED ORDER — KETAMINE HCL 10 MG/ML IJ SOLN
INTRAMUSCULAR | Status: DC | PRN
  Administered 2019-12-18: 30 mg via INTRAVENOUS
  Administered 2019-12-18: 10 mg via INTRAVENOUS

## 2019-12-18 MED ORDER — CEFAZOLIN SODIUM-DEXTROSE 2-4 GM/100ML-% IV SOLN
2.0000 g | INTRAVENOUS | Status: AC
  Administered 2019-12-18: 2 g via INTRAVENOUS
  Filled 2019-12-18: qty 100

## 2019-12-18 MED ORDER — ACETAMINOPHEN 500 MG PO TABS
1000.0000 mg | ORAL_TABLET | ORAL | Status: AC
  Administered 2019-12-18: 1000 mg via ORAL
  Filled 2019-12-18: qty 2

## 2019-12-18 MED ORDER — 0.9 % SODIUM CHLORIDE (POUR BTL) OPTIME
TOPICAL | Status: DC | PRN
  Administered 2019-12-18: 1000 mL

## 2019-12-18 MED ORDER — HYDROMORPHONE HCL 1 MG/ML IJ SOLN: 0.2500 mg | INTRAMUSCULAR | Status: DC | PRN

## 2019-12-18 MED ORDER — SCOPOLAMINE 1 MG/3DAYS TD PT72
1.0000 | MEDICATED_PATCH | TRANSDERMAL | Status: DC
  Administered 2019-12-18: 1.5 mg via TRANSDERMAL

## 2019-12-18 MED ORDER — SUCCINYLCHOLINE CHLORIDE 200 MG/10ML IV SOSY
PREFILLED_SYRINGE | INTRAVENOUS | Status: AC
  Filled 2019-12-18: qty 10

## 2019-12-18 MED ORDER — HEPARIN SODIUM (PORCINE) 5000 UNIT/ML IJ SOLN
5000.0000 [IU] | Freq: Once | INTRAMUSCULAR | Status: AC
  Administered 2019-12-18: 5000 [IU] via SUBCUTANEOUS
  Filled 2019-12-18: qty 1

## 2019-12-18 MED ORDER — PHENYLEPHRINE 40 MCG/ML (10ML) SYRINGE FOR IV PUSH (FOR BLOOD PRESSURE SUPPORT)
PREFILLED_SYRINGE | INTRAVENOUS | Status: AC
  Filled 2019-12-18: qty 10

## 2019-12-18 MED ORDER — OXYCODONE HCL 5 MG/5ML PO SOLN: 5.0000 mg | Freq: Once | ORAL | Status: DC | PRN

## 2019-12-18 MED ORDER — MIDAZOLAM HCL 5 MG/5ML IJ SOLN
INTRAMUSCULAR | Status: DC | PRN
  Administered 2019-12-18: 2 mg via INTRAVENOUS

## 2019-12-18 MED ORDER — PHENYLEPHRINE HCL (PRESSORS) 10 MG/ML IV SOLN
INTRAVENOUS | Status: DC | PRN
  Administered 2019-12-18 (×2): 80 ug via INTRAVENOUS
  Administered 2019-12-18: 120 ug via INTRAVENOUS

## 2019-12-18 MED ORDER — SUGAMMADEX SODIUM 200 MG/2ML IV SOLN
INTRAVENOUS | Status: DC | PRN
  Administered 2019-12-18: 200 mg via INTRAVENOUS

## 2019-12-18 MED ORDER — CHLORHEXIDINE GLUCONATE 0.12 % MT SOLN: 15.0000 mL | Freq: Once | OROMUCOSAL | Status: DC

## 2019-12-18 MED ORDER — KETAMINE HCL 10 MG/ML IJ SOLN
INTRAMUSCULAR | Status: AC
  Filled 2019-12-18: qty 1

## 2019-12-18 MED ORDER — KETOROLAC TROMETHAMINE 30 MG/ML IJ SOLN: 30.0000 mg | Freq: Once | INTRAMUSCULAR | Status: DC | PRN

## 2019-12-18 MED ORDER — LIDOCAINE 2% (20 MG/ML) 5 ML SYRINGE
INTRAMUSCULAR | Status: DC | PRN
  Administered 2019-12-18: 60 mg via INTRAVENOUS

## 2019-12-18 MED ORDER — SODIUM CHLORIDE (PF) 0.9 % IJ SOLN
INTRAMUSCULAR | Status: AC
  Filled 2019-12-18: qty 50

## 2019-12-18 SURGICAL SUPPLY — 53 items
APL PRP STRL LF DISP 70% ISPRP (MISCELLANEOUS) ×1
APPLIER CLIP ROT 10 11.4 M/L (STAPLE)
APR CLP MED LRG 11.4X10 (STAPLE)
CABLE HIGH FREQUENCY MONO STRZ (ELECTRODE) IMPLANT
CHLORAPREP W/TINT 26 (MISCELLANEOUS) ×2 IMPLANT
CLIP APPLIE ROT 10 11.4 M/L (STAPLE) IMPLANT
COVER WAND RF STERILE (DRAPES) IMPLANT
DECANTER SPIKE VIAL GLASS SM (MISCELLANEOUS) ×2 IMPLANT
DEVICE SUT QUICK LOAD TK 5 (STAPLE) ×3 IMPLANT
DEVICE SUT TI-KNOT TK 5X26 (MISCELLANEOUS) ×1 IMPLANT
DEVICE SUTURE ENDOST 10MM (ENDOMECHANICALS) ×2 IMPLANT
DISSECTOR BLUNT TIP ENDO 5MM (MISCELLANEOUS) ×2 IMPLANT
DRAIN PENROSE 0.5X18 (DRAIN) ×2 IMPLANT
DRSG TEGADERM 2-3/8X2-3/4 SM (GAUZE/BANDAGES/DRESSINGS) ×6 IMPLANT
ELECT L-HOOK LAP 45CM DISP (ELECTROSURGICAL)
ELECT REM PT RETURN 15FT ADLT (MISCELLANEOUS) ×2 IMPLANT
ELECTRODE L-HOOK LAP 45CM DISP (ELECTROSURGICAL) ×1 IMPLANT
GAUZE SPONGE 2X2 8PLY STRL LF (GAUZE/BANDAGES/DRESSINGS) IMPLANT
GLOVE BIO SURGEON STRL SZ7.5 (GLOVE) ×2 IMPLANT
GLOVE BIOGEL PI IND STRL 7.0 (GLOVE) IMPLANT
GLOVE BIOGEL PI INDICATOR 7.0 (GLOVE)
GLOVE INDICATOR 8.0 STRL GRN (GLOVE) ×2 IMPLANT
GOWN STRL REUS W/TWL LRG LVL3 (GOWN DISPOSABLE) ×2 IMPLANT
GOWN STRL REUS W/TWL XL LVL3 (GOWN DISPOSABLE) ×6 IMPLANT
GRASPER SUT TROCAR 14GX15 (MISCELLANEOUS) ×2 IMPLANT
KIT BASIN OR (CUSTOM PROCEDURE TRAY) ×2 IMPLANT
KIT ESOPHYX Z+ (Miscellaneous) ×24 IMPLANT
KIT TURNOVER KIT A (KITS) ×1 IMPLANT
PACK UNIVERSAL I (CUSTOM PROCEDURE TRAY) ×1 IMPLANT
PENCIL SMOKE EVACUATOR (MISCELLANEOUS) IMPLANT
SCISSORS LAP 5X45 EPIX DISP (ENDOMECHANICALS) ×2 IMPLANT
SET IRRIG TUBING LAPAROSCOPIC (IRRIGATION / IRRIGATOR) ×2 IMPLANT
SET TUBE SMOKE EVAC HIGH FLOW (TUBING) ×3 IMPLANT
SHEARS HARMONIC ACE PLUS 45CM (MISCELLANEOUS) ×2 IMPLANT
SLEEVE XCEL OPT CAN 5 100 (ENDOMECHANICALS) ×6 IMPLANT
SPONGE GAUZE 2X2 STER 10/PKG (GAUZE/BANDAGES/DRESSINGS) ×1
SUT ETHIBOND 2 0 SH (SUTURE)
SUT ETHIBOND 2 0 SH 36X2 (SUTURE) ×3 IMPLANT
SUT MNCRL AB 4-0 PS2 18 (SUTURE) ×2 IMPLANT
SUT SURGIDAC NAB ES-9 0 48 120 (SUTURE) ×7 IMPLANT
TIP INNERVISION DETACH 40FR (MISCELLANEOUS) IMPLANT
TIP INNERVISION DETACH 50FR (MISCELLANEOUS) IMPLANT
TIP INNERVISION DETACH 56FR (MISCELLANEOUS) IMPLANT
TIPS INNERVISION DETACH 40FR (MISCELLANEOUS)
TOWEL OR 17X26 10 PK STRL BLUE (TOWEL DISPOSABLE) ×2 IMPLANT
TOWEL OR NON WOVEN STRL DISP B (DISPOSABLE) IMPLANT
TRAY FOLEY MTR SLVR 16FR STAT (SET/KITS/TRAYS/PACK) ×2 IMPLANT
TRAY LAPAROSCOPIC (CUSTOM PROCEDURE TRAY) ×2 IMPLANT
TROCAR BLADELESS OPT 5 100 (ENDOMECHANICALS) ×2 IMPLANT
TROCAR XCEL BLUNT TIP 100MML (ENDOMECHANICALS) IMPLANT
TROCAR XCEL NON-BLD 11X100MML (ENDOMECHANICALS) ×2 IMPLANT
TUBE CALIBRATION LAPBAND (TUBING) ×1 IMPLANT
TUBING CONNECTING 10 (TUBING) ×1 IMPLANT

## 2019-12-18 NOTE — Op Note (Signed)
12/18/2019  8:47 AM  PATIENT:  Carrie Nelson  39 y.o. female  PRE-OPERATIVE DIAGNOSIS:  GERD with HIATAL HERNIA  POST-OPERATIVE DIAGNOSIS:  GERD with sliding HIATAL HERNIA  PROCEDURE:  Procedure(s): LAPAROSCOPIC REPAIR OF HIATAL HERNIA TRANSORAL INCISIONLESS FUNDOPLICATION - Dr Barron Alvine ESOPHAGOGASTRODUODENOSCOPY (EGD) LAPAROSCOPIC BILATERAL TAP BLOCK  SURGEON:  Surgeon(s): Gaynelle Adu, MD Shellia Cleverly, DO  ASSISTANTS: Ovidio Kin, MD   ANESTHESIA:   general  DRAINS: none   LOCAL MEDICATIONS USED:  OTHER exparel  SPECIMEN:  No Specimen  DISPOSITION OF SPECIMEN:  N/A  COUNTS:  YES  INDICATION FOR PROCEDURE: Carrie Nelson is an 39 y.o. female who is here for combined laparoscopic repair of hiatal hernia with TIF by Dr Barron Alvine. She denies any changes since seen in clinic in July other than thyroid medication changes.   The patient is a 39 year old female who presents for an evaluation of a hernia. She is referred by Dr Barron Alvine to discuss concomitant lap hiatal hernia repair with TIF. She states that her symptoms began about a year ago. She initially started having episodes where she felt she was having to constantly clear her throat. She initially thought it was sinus drainage causing irritation. Her husband is a Engineer, civil (consulting). She initially tried Mucinex but that didn't help. Her husband thought it may be heartburn related since she started taking some Nexium with no improvement. She ended up seeing [facility] and they performed a laryngoscopy by report it showed some reflux irritation on the vocal cords. She chin tried several months of Nexium. She states that it helped a little bit but she was still having to clear her throat. She denies any heartburn. She denies any early satiety. She denies any sour taste in the back for mouth. She denies any burning in her chest. She denies any regurgitation of solid food. She denies any pain with swallowing  solids or liquids. She ended up seeing Dr. Margretta Sidle and ended up undergoing an upper endoscopy which is described below which I personally reviewed as well. She then underwent esophageal manometry and pH studies which I reviewed as well. I also reviewed both gastroenterologist office notes as well as the note from her neurologist. She has multiple sclerosis. She is currently not taking the lemtrada. It some medicine that she take about once a year and only repeat if have a flare  PROCEDURE: Patient was given oral Tylenol preoperatively as part of our enhanced recovery protocol.  She also received 5000 units of subcutaneous heparin.  After informed consent was obtained she was taken to the OR 5 at [facility] and placed supine on the operating room table.  General endotracheal anesthesia was established.  Sequential compression devices were placed.  A Foley catheter was placed.  Her arms were tucked with the appropriate padding.  IV antibiotic had been administered.  A surgical timeout was performed.  Using Optiview technique I gain access to her abdomen in the left upper quadrant at Palmer's point.  A 0 degree 5 mm laparoscope was advanced through all layers of the abdominal cavity and carefully entered the abdomen.  Pneumoperitoneum was smoothly established up to a patient pressure of 15 mmHg without any change in patient vital signs.  Laparoscope was inserted and the abdominal cavity was surveilled.  There was no evidence of injury to surrounding viscera.  Patient was placed in steep reverse Trendelenburg.  Additional trochars were placed to the left of the umbilicus tube in the right abdomen and  1 in the left lateral abdominal wall.  All of these were 5 mm trochars except for the right midabdomen which was an 11 mm trocar.  A Nathanson liver retractor was placed in the subxiphoid position to lift up the left lobe of the liver.  The patient had very little visceral adiposity.  She had excellent  anatomy.  A bilateral laparoscopic tap block was performed using Exparel.  The left and right crura were readily identified.  She appeared to have a sliding hiatal hernia.  There was a gap anteriorly between the left and right crura.  A calibration tube with the balloon on the end of it placed in the oropharynx and gently guided down into the stomach by the anesthetist.  10 cc of air was inflated into the balloon and the tube was pulled back it slightly went above the hiatus.  This confirmed my suspicion of a clinically significant sliding hiatal hernia.  My assistant grasped gastrohepatic ligament and I also retracted it laterally and then incised with harmonic scalpel.  The right crus of the diaphragm was identified.  The peritoneum was slightly incised with harmonic scalpel.  Then using gentle blunt dissection with my assistant retracting the proximal stomach I was able to identify the junction of the left and right crura.  There was a gap.  I mobilized the esophagus off of the crura.  The aorta was identified.  I did a posterior dissection of the distal esophagus.  The posterior vagus nerve was identified.  At this point there was 2-1/2 cm of esophagus within the abdominal cavity.  I then reapproximated the left and right crura with 2 interrupted 0 Surgidac sutures using the Endo Stitch each secured with a titanium tie knot.  We then had anesthesia readvanced the calibration tube back down into the stomach.  10 cc of air was inflated into the balloon.  She then pulled back and the balloon stayed within the abdominal cavity.  It did not slide through the hiatus anymore.  At this point I felt that I achieved adequate reapproximation of the crura.  The 11 mm trocar was removed and the fascial defect was reapproximated with an interrupted 0 Vicryl using a PMI suture passer.  Local was infiltrated in this area and along the subxiphoid location.  The [facility] liver retractor was removed without any injury to  surrounding structures.  Pneumoperitoneum was released.  Skin incisions were closed with a 4-0 Monocryl in a subcuticular fashion followed by the application of Steri-Strips 2 x 2's and Tegaderms.  All needle, instrument sponge counts are correct x2.  There were no immediate complications during my portion of the procedure.    At this point the endoscopy team and Dr. Barron Alvine entered the operating room to perform the TIF portion of the procedure.  I updated the family at the conclusion of my portion of the procedure  PLAN OF CARE: Admit for overnight observation  PATIENT DISPOSITION:  in OR with Dr Barron Alvine for TIF   Delay start of Pharmacological VTE agent (>24hrs) due to surgical blood loss or risk of bleeding:  no  Mary Sella. Andrey Campanile, MD, FACS General, Bariatric, & Minimally Invasive Surgery [facility], [place]

## 2019-12-18 NOTE — H&P (View-Only) (Signed)
P  Chief Complaint:   GERD  HPI:    Carrie Nelson is a 39 year old female with a history of multiple sclerosis (received Lemtrada), hypothyroidism s/p iodine ablation with subsequent hypothyroidism (on Synthroid),  presenting today for laparoscopic hiatal hernia repair and concomitant transoral Incisionless Fundoplication (TIF).  GERD history: -Index symptoms: Regurgitation, increased throat clearing, globus sensation.  Increased nocturnal throat clearing.  No dysphagia or heartburn -Exacerbating factors: Increased postprandial symptoms -Medications trialed: Nexium -Current medications: Nexium 40 mg/day -Complications: LPR, LES laxity with small HH   GERD evaluation: -Last EGD: 05/07/2019.  Images reviewed by me today -Laryngoscopy: 01/2019 in [place]: e/o reflux irritation. -Barium esophagram: None -Esophageal Manometry: Normal, no HH -pH/Impedance (on PPI): Good acid control with DeMeester 2.2, increased reflux events with increased esophageal bolus exposure with predominantly weakly acid reflux (95 events with 92 being weakly acid) -recommended consideration of antireflux surgery  Endoscopic History: -EGD (04/27/2019, Dr. 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, <1 cm axial HH, appears 2.5-3 cm in transverse width    Review of systems:     No chest pain, no SOB, no fevers, no urinary sx   Past Medical History:  Diagnosis Date  . Complication of anesthesia   . GERD (gastroesophageal reflux disease)   . Hypothyroidism   . Migraines   . Multiple sclerosis (HCC)   . PONV (postoperative nausea and vomiting)   . Syncope   . Thyrotoxicosis   . Tremor     Patient's surgical history, family medical history, social history, medications and allergies were all reviewed in Epic    Current Facility-Administered Medications  Medication Dose Route Frequency Provider Last Rate Last Admin  . acetaminophen (TYLENOL) tablet 1,000 mg  1,000 mg Oral  Once Finucane, Elizabeth M, DO      . bupivacaine liposome (EXPAREL) 1.3 % injection 266 mg  20 mL Infiltration Once Gaynelle Adu, MD      . ceFAZolin (ANCEF) IVPB 2g/100 mL premix  2 g Intravenous On Call to OR Gaynelle Adu, MD      . chlorhexidine (PERIDEX) 0.12 % solution 15 mL  15 mL Mouth/Throat Once Lucretia Kern, MD       Or  . MEDLINE mouth rinse  15 mL Mouth Rinse Once Lucretia Kern, MD      . Chlorhexidine Gluconate Cloth 2 % PADS 6 each  6 each Topical Once Gaynelle Adu, MD       And  . Chlorhexidine Gluconate Cloth 2 % PADS 6 each  6 each Topical Once Gaynelle Adu, MD      . dexamethasone (DECADRON) injection 4 mg  4 mg Intravenous On Call to OR Gaynelle Adu, MD      . lactated ringers infusion   Intravenous Continuous Lucretia Kern, MD 10 mL/hr at 12/18/19 9628 Continued from Pre-op at 12/18/19 0649  . scopolamine (TRANSDERM-SCOP) 1 MG/3DAYS 1.5 mg  1 patch Transdermal Q72H Finucane, Elizabeth M, DO      . scopolamine (TRANSDERM-SCOP) 1 MG/3DAYS 1.5 mg  1 patch Transdermal On Call to OR Gaynelle Adu, MD   1.5 mg at 12/18/19 0603    Physical Exam:     BP 120/69   Pulse 81   Temp 97.9 F (36.6 C) (Oral)   Resp 16   SpO2 99%   GENERAL:  Pleasant female in NAD PSYCH: : Cooperative, normal affect EENT:  conjunctiva pink, mucous membranes moist, neck supple without masses CARDIAC:  RRR, no murmur  heard, no peripheral edema PULM: Normal respiratory effort, lungs CTA bilaterally, no wheezing ABDOMEN:  Nondistended, soft, nontender. No obvious masses, no hepatomegaly,  normal bowel sounds SKIN:  turgor, no lesions seen Musculoskeletal:  Normal muscle tone, normal strength NEURO: Alert and oriented x 3, no focal neurologic deficits   IMPRESSION and PLAN:    1) GERD 2) Increased throat clearing/LPR 3) Globus sensation 4) Hiatal hernia  38-year-old female with mostly atypical reflux symptoms, with clear objective evidence of refluxate  2/2 significant LES  laxity and anatomic distraction with Hill grade 3 valve and small hiatal hernia.  Plan for laparoscopic hiatal hernia repair and concomitant TIF today.  Previously cleared by her Neurologist to proceed with elective surgery.         Zeola Brys V Julius Boniface ,DO, FACG 12/18/2019, 7:21 AM  

## 2019-12-18 NOTE — Anesthesia Procedure Notes (Signed)
Procedure Name: Intubation Date/Time: 12/18/2019 7:27 AM Performed by: Caren Macadam, CRNA Pre-anesthesia Checklist: Patient identified, Emergency Drugs available, Suction available and Patient being monitored Patient Re-evaluated:Patient Re-evaluated prior to induction Oxygen Delivery Method: Circle system utilized Preoxygenation: Pre-oxygenation with 100% oxygen Induction Type: IV induction, Rapid sequence and Cricoid Pressure applied Ventilation: Mask ventilation without difficulty Laryngoscope Size: Miller and 2 Grade View: Grade I Tube type: Oral Tube size: 7.0 mm Number of attempts: 1 Airway Equipment and Method: Stylet Placement Confirmation: ETT inserted through vocal cords under direct vision,  positive ETCO2 and breath sounds checked- equal and bilateral Secured at: 23 cm Tube secured with: Tape Dental Injury: Teeth and Oropharynx as per pre-operative assessment

## 2019-12-18 NOTE — Progress Notes (Signed)
P  Chief Complaint:   GERD  HPI:    Carrie Nelson is a 39 year old female with a history of multiple sclerosis (received Lemtrada), hypothyroidism s/p iodine ablation with subsequent hypothyroidism (on Synthroid),  presenting today for laparoscopic hiatal hernia repair and concomitant transoral Incisionless Fundoplication (TIF).  GERD history: -Index symptoms: Regurgitation, increased throat clearing, globus sensation.  Increased nocturnal throat clearing.  No dysphagia or heartburn -Exacerbating factors: Increased postprandial symptoms -Medications trialed: Nexium -Current medications: Nexium 40 mg/day -Complications: LPR, LES laxity with small HH   GERD evaluation: -Last EGD: 05/07/2019.  Images reviewed by me today -Laryngoscopy: 01/2019 in [place]: e/o reflux irritation. -Barium esophagram: None -Esophageal Manometry: Normal, no HH -pH/Impedance (on PPI): Good acid control with DeMeester 2.2, increased reflux events with increased esophageal bolus exposure with predominantly weakly acid reflux (95 events with 92 being weakly acid) -recommended consideration of antireflux surgery  Endoscopic History: -EGD (04/27/2019, Dr. 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, <1 cm axial HH, appears 2.5-3 cm in transverse width    Review of systems:     No chest pain, no SOB, no fevers, no urinary sx   Past Medical History:  Diagnosis Date  . Complication of anesthesia   . GERD (gastroesophageal reflux disease)   . Hypothyroidism   . Migraines   . Multiple sclerosis (HCC)   . PONV (postoperative nausea and vomiting)   . Syncope   . Thyrotoxicosis   . Tremor     Patient's surgical history, family medical history, social history, medications and allergies were all reviewed in Epic    Current Facility-Administered Medications  Medication Dose Route Frequency Provider Last Rate Last Admin  . acetaminophen (TYLENOL) tablet 1,000 mg  1,000 mg Oral  Once Finucane, Elizabeth M, DO      . bupivacaine liposome (EXPAREL) 1.3 % injection 266 mg  20 mL Infiltration Once Gaynelle Adu, MD      . ceFAZolin (ANCEF) IVPB 2g/100 mL premix  2 g Intravenous On Call to OR Gaynelle Adu, MD      . chlorhexidine (PERIDEX) 0.12 % solution 15 mL  15 mL Mouth/Throat Once Lucretia Kern, MD       Or  . MEDLINE mouth rinse  15 mL Mouth Rinse Once Lucretia Kern, MD      . Chlorhexidine Gluconate Cloth 2 % PADS 6 each  6 each Topical Once Gaynelle Adu, MD       And  . Chlorhexidine Gluconate Cloth 2 % PADS 6 each  6 each Topical Once Gaynelle Adu, MD      . dexamethasone (DECADRON) injection 4 mg  4 mg Intravenous On Call to OR Gaynelle Adu, MD      . lactated ringers infusion   Intravenous Continuous Lucretia Kern, MD 10 mL/hr at 12/18/19 9628 Continued from Pre-op at 12/18/19 0649  . scopolamine (TRANSDERM-SCOP) 1 MG/3DAYS 1.5 mg  1 patch Transdermal Q72H Finucane, Elizabeth M, DO      . scopolamine (TRANSDERM-SCOP) 1 MG/3DAYS 1.5 mg  1 patch Transdermal On Call to OR Gaynelle Adu, MD   1.5 mg at 12/18/19 0603    Physical Exam:     BP 120/69   Pulse 81   Temp 97.9 F (36.6 C) (Oral)   Resp 16   SpO2 99%   GENERAL:  Pleasant female in NAD PSYCH: : Cooperative, normal affect EENT:  conjunctiva pink, mucous membranes moist, neck supple without masses CARDIAC:  RRR, no murmur  heard, no peripheral edema PULM: Normal respiratory effort, lungs CTA bilaterally, no wheezing ABDOMEN:  Nondistended, soft, nontender. No obvious masses, no hepatomegaly,  normal bowel sounds SKIN:  turgor, no lesions seen Musculoskeletal:  Normal muscle tone, normal strength NEURO: Alert and oriented x 3, no focal neurologic deficits   IMPRESSION and PLAN:    1) GERD 2) Increased throat clearing/LPR 3) Globus sensation 4) Hiatal hernia  39 year old female with mostly atypical reflux symptoms, with clear objective evidence of refluxate  2/2 significant LES  laxity and anatomic distraction with Hill grade 3 valve and small hiatal hernia.  Plan for laparoscopic hiatal hernia repair and concomitant TIF today.  Previously cleared by her Neurologist to proceed with elective surgery.         Shellia Cleverly ,DO, FACG 12/18/2019, 7:21 AM

## 2019-12-18 NOTE — Interval H&P Note (Signed)
History and Physical Interval Note:  12/18/2019 7:26 AM  Carrie Nelson  has presented today for surgery, with the diagnosis of HIATAL HERNIA.  The various methods of treatment have been discussed with the patient and family. After consideration of risks, benefits and other options for treatment, the patient has consented to  Procedure(s): LAPAROSCOPIC REPAIR OF HIATAL HERNIA (N/A) TRANSORAL INCISIONLESS FUNDOPLICATION (N/A) ESOPHAGOGASTRODUODENOSCOPY (EGD) (N/A) as a surgical intervention.  The patient's history has been reviewed, patient examined, no change in status, stable for surgery.  I have reviewed the patient's chart and labs.  Questions were answered to the patient's satisfaction.     Verlin Dike Marcos Peloso

## 2019-12-18 NOTE — Transfer of Care (Signed)
Immediate Anesthesia Transfer of Care Note  Patient: Carrie Nelson  Procedure(s) Performed: LAPAROSCOPIC REPAIR OF HIATAL HERNIA (N/A ) TRANSORAL INCISIONLESS FUNDOPLICATION (N/A ) ESOPHAGOGASTRODUODENOSCOPY (EGD) (N/A )  Patient Location: PACU  Anesthesia Type:General  Level of Consciousness: drowsy  Airway & Oxygen Therapy: Patient Spontanous Breathing and Patient connected to face mask oxygen  Post-op Assessment: Report given to RN and Post -op Vital signs reviewed and stable  Post vital signs: Reviewed and stable  Last Vitals:  Vitals Value Taken Time  BP 121/73 12/18/19 0940  Temp    Pulse 88 12/18/19 0942  Resp 11 12/18/19 0942  SpO2 100 % 12/18/19 0942  Vitals shown include unvalidated device data.  Last Pain:  Vitals:   12/18/19 0606  TempSrc: Oral         Complications: No complications documented.

## 2019-12-18 NOTE — Op Note (Signed)
[facility] Patient Name: Carrie Nelson Procedure Date: 12/18/2019 MRN: [id number] Attending MD: Doristine Locks , MD Date of Birth: June 10, 1980 CSN: [id number] Age: 39 Admit Type: Inpatient Procedure:                Upper GI endoscopy Indications:              Therapeutic procedure, For therapy of esophageal                            reflux Providers:                Doristine Locks, MD, Vicki Mallet, RN, Margaree Mackintosh, RN, Arlee Muslim Tech., Technician Referring MD:              Medicines:                General Anesthesia Complications:            No immediate complications. Estimated Blood Loss:     Estimated blood loss was minimal. Procedure:                Pre-Anesthesia Assessment:                           - Prior to the procedure, a History and Physical                            was performed, and patient medications and                            allergies were reviewed. The patient's tolerance of                            previous anesthesia was also reviewed. The risks                            and benefits of the procedure and the sedation                            options and risks were discussed with the patient.                            All questions were answered, and informed consent                            was obtained. Prior Anticoagulants: The patient has                            taken no previous anticoagulant or antiplatelet                            agents. ASA Grade Assessment: II - A patient with  mild systemic disease. After reviewing the risks                            and benefits, the patient was deemed in                            satisfactory condition to undergo the procedure.                           After obtaining informed consent, the endoscope was                            passed under direct vision. Throughout the                             procedure, the patient's blood pressure, pulse, and                            oxygen saturations were monitored continuously. The                            GIF-H190 ([id number]) Olympus gastroscope was                            introduced through the mouth, and advanced to the                            second part of duodenum. The upper GI endoscopy was                            accomplished without difficulty. The patient                            tolerated the procedure well. Scope In: Scope Out: Findings:      The upper third of the esophagus, middle third of the esophagus and       lower third of the esophagus were normal. The scope was withdrawn.       Dilation was performed with a Maloney dilator with no resistance at 54       Fr. The dilation site was examined following endoscope reinsertion and       showed no bleeding, mucosal tear or perforation. Estimated blood loss:       none.      The Z-line was regular and was found 38 cm from the incisors. The       decision was made to perform transoral fundoplication with the EsophyX       Z+ system. Before the procedure, the gastroesophageal flap valve was       classified as Hill Grade II (fold present, opens with respiration). The       endoscope was withdrawn, placed through the plication device, reinserted       into the patient and advanced past the level of the GE junction at 38 cm       from the incisors and into the stomach. Next, the endoscope was advanced       beyond the device  and retroflexed. The first plication site was       identified at the 11 o'clock position. With the device in the proper       position, the helical retractor was deployed and tissue was pulled into       the mold before it was closed. The device was rotated, suction was       applied using the invaginator, then the device was advanced slightly and       two H-shaped fasteners were placed. The device was reloaded and the       process repeated in  order to deploy a total of six fasteners at the       first site. The device was then rotated to the 1 o'clock position after       which the helical retractor was used to grasp additional tissue within       the mold before rotation and deployment of a total of ten fasteners at       the second site. To complete reconstruction of the valve, additional       fasteners were deployed at the following sites: four fasteners at 5       o'clock and four fasteners at 7 o'clock positions. In total, 24       fasteners contributed to create a valve measuring 3 cm in length which       involved 270 degrees of the circumference upon retroflexed view.       Following the procedure, the flap valve was reclassified as Hill Grade I       (prominent fold, tight to endoscope). The EsophyX device and endoscope       were then removed. Relook endoscopy was performed prior to the       conclusion of the case to confirm the above findings. Estimated blood       loss was minimal.      The entire examined stomach was normal.      The examined duodenum was normal. Impression:               - Normal upper third of esophagus, middle third of                            es                           ophagus and lower third of esophagus. Dilated.                           - Z-line regular, 38 cm from the incisors.                           - Normal stomach.                           - Normal examined duodenum.                           - EsophyX transoral fundoplication was performed.                           - No specimens collected. Moderate Sedation:      Not Applicable - Patient  had care per Anesthesia. Recommendation:           -Admit to surgical ward for overnight observation                            with anticipated discharge tomorrow                           -Zofran 4 mg IV every 6 hours x24 hours, then prn                           -Reglan 10 mg every 6 hours x24 hours, then prn                            -Resume scopolamine patch x3 days (applied preop)                           -Protonix 40 mg p.o. BID x2 weeks, then 40 mg daily                            x2 weeks, then 20 mg daily x1 week then prn                           -Decadron 8 mg every 6 hours times max 5 doses                           -Gas-X (simethicone) 4225 mg p.o. prn every 6 hours                            gas pain, abdominal discomfort                           -Tylenol 3 (APAP 120 mg/codeine 12 mg per 5 mL): 15                            mL's every 4 hours prn pain                           -Colace 100 mg p.o. twice daily if taking pain                            medications                           -Clear liquid diet okay overnight                           -Okay to ambulate with assist around the ward                           -Please do not hesitate to contact me directly with                            any postoperative questions or  concerns Procedure Code(s):        --- Professional ---                           [phone], Esophagogastroduodenoscopy, flexible,                            transoral; with esophagogastric fundoplasty,                            partial or complete, includes duodenoscopy when                            performed                           43450, Dilation of esophagus, by unguided sound or                            bougie, single or multiple passes Diagnosis Code(s):        --- Professional ---                           K21.9, Gastro-esophageal reflux disease without                            esophagitis CPT copyright 2019 [employer]. All rights reserved. The codes documented in this report are preliminary and upon coder review may  be revised to meet current compliance requirements. Doristine Locks, MD 12/18/2019 10:10:12 AM Number of Addenda: 0

## 2019-12-18 NOTE — Anesthesia Postprocedure Evaluation (Signed)
Anesthesia Post Note  Patient: Cressie C Roanhorse  Procedure(s) Performed: LAPAROSCOPIC REPAIR OF HIATAL HERNIA (N/A ) TRANSORAL INCISIONLESS FUNDOPLICATION (N/A ) ESOPHAGOGASTRODUODENOSCOPY (EGD) (N/A )     Patient location during evaluation: PACU Anesthesia Type: General Level of consciousness: awake and alert, oriented and patient cooperative Pain management: pain level controlled Vital Signs Assessment: post-procedure vital signs reviewed and stable Respiratory status: spontaneous breathing, nonlabored ventilation and respiratory function stable Cardiovascular status: blood pressure returned to baseline and stable Postop Assessment: no apparent nausea or vomiting Anesthetic complications: no   No complications documented.  Last Vitals:  Vitals:   12/18/19 0945 12/18/19 1000  BP: 123/78 122/77  Pulse: 84 72  Resp: 13 11  Temp:    SpO2: 100% 99%    Last Pain:  Vitals:   12/18/19 1000  TempSrc:   PainSc: 0-No pain                 Lannie Fields

## 2019-12-18 NOTE — Discharge Instructions (Signed)
EATING AFTER YOUR ESOPHAGEAL SURGERY (Stomach Fundoplication, Hiatal Hernia repair, Achalasia surgery, etc)  ######################################################################  EAT Start with a pureed / full liquid diet (see below) Gradually transition to a high fiber diet with a fiber supplement over the next month after discharge.    WALK Walk an hour a day.  Control your pain to do that.    CONTROL PAIN Control pain so that you can walk, sleep, tolerate sneezing/coughing, go up/down stairs.  HAVE A BOWEL MOVEMENT DAILY Keep your bowels regular to avoid problems.  OK to try a laxative to override constipation.  OK to use an antidairrheal to slow down diarrhea.  Call if not better after 2 tries  CALL IF YOU HAVE PROBLEMS/CONCERNS Call if you are still struggling despite following these instructions. Call if you have concerns not answered by these instructions  ######################################################################   After your esophageal surgery, expect some sticking with swallowing over the next 1-2 months.    If food sticks when you eat, it is called "dysphagia".  This is due to swelling around your esophagus at the wrap & hiatal diaphragm repair.  It will gradually ease off over the next few months.  To help you through this temporary phase, we start you out on a pureed (blenderized) diet.  Your first meal in the hospital was thin liquids.  You should have been given a pureed diet by the time you left the hospital.  We ask patients to stay on a pureed diet for the first 2-3 weeks to avoid anything getting "stuck" near your recent surgery.  Don't be alarmed if your ability to swallow doesn't progress according to this plan.  Everyone is different and some diets can advance more or less quickly.    It is often helpful to crush your medications or split them as they can sometimes stick, especially the first week or so.   Some BASIC RULES to follow  are:  Maintain an upright position whenever eating or drinking.  Take small bites - just a teaspoon size bite at a time.  Eat slowly.  It may also help to eat only one food at a time.  Consider nibbling through smaller, more frequent meals & avoid the urge to eat BIG meals  Do not push through feelings of fullness, nausea, or bloatedness  Do not mix solid foods and liquids in the same mouthful  Try not to "wash foods down" with large gulps of liquids.  Avoid carbonated (bubbly/fizzy) drinks.    Avoid foods that make you feel gassy or bloated.  Start with bland foods first.  Wait on trying greasy, fried, or spicy meals until you are tolerating more bland solids well.  Understand that it will be hard to burp and belch at first.  This gradually improves with time.  Expect to be more gassy/flatulent/bloated initially.  Walking will help your body manage it better.  Consider using medications for bloating that contain simethicone such as  Maalox or Gas-X   Consider crushing her medications, especially smaller pills.  The ability to swallow pills should get easier after a few weeks  Eat in a relaxed atmosphere & minimize distractions.  Avoid talking while eating.    Do not use straws.  Following each meal, sit in an upright position (90 degree angle) for 60 to 90 minutes.  Going for a short walk can help as well  If food does stick, don't panic.  Try to relax and let the food  pass on its own.  Sipping WARM LIQUID such as strong hot black tea can also help slide it down.   Be gradual in changes & use common sense:  -If you easily tolerating a certain "level" of foods, advance to the next level gradually -If you are having trouble swallowing a particular food, then avoid it.   -If food is sticking when you advance your diet, go back to thinner previous diet (the lower LEVEL) for 1-2 days.  LEVEL 1 = PUREED DIET  Do for the first 2 WEEKS AFTER SURGERY  -Foods in this group are  pureed or blenderized to a smooth, mashed potato-like consistency.  -If necessary, the pureed foods can keep their shape with the addition of a thickening agent.   -Meat should be pureed to a smooth, pasty consistency.  Hot broth or gravy may be added to the pureed meat, approximately 1 oz. of liquid per 3 oz. serving of meat. -CAUTION:  If any foods do not puree into a smooth consistency, swallowing will be more difficult.  (For example, nuts or seeds sometimes do not blend well.)  Hot Foods Cold Foods  Pureed scrambled eggs and cheese Pureed cottage cheese  Baby cereals Thickened juices and nectars  Thinned cooked cereals (no lumps) Thickened milk or eggnog  Pureed [place] toast or pancakes Ensure  Mashed potatoes Ice cream  Pureed parsley, au gratin, scalloped potatoes, candied sweet potatoes Fruit or [place] ice, sherbet  Pureed buttered or alfredo noodles Plain yogurt  Pureed vegetables (no corn or peas) Instant breakfast  Pureed soups and creamed soups Smooth pudding, mousse, custard  Pureed scalloped apples Whipped gelatin  Gravies Sugar, syrup, honey, jelly  Sauces, cheese, tomato, barbecue, white, creamed Cream  Any baby food Creamer  Alcohol in moderation (not beer or champagne) Margarine  Coffee or tea Mayonnaise   Ketchup, mustard   Apple sauce   SAMPLE MENU:  PUREED DIET Breakfast Lunch Dinner   Orange juice, 1/2 cup  Cream of wheat, 1/2 cup  Pineapple juice, 1/2 cup  Pureed [place], barley soup, 3/4 cup  Pureed Hawaiian chicken, 3 oz   Scrambled eggs, mashed or blended with cheese, 1/2 cup  Tea or coffee, 1 cup   Whole milk, 1 cup   Non-dairy creamer, 2 Tbsp.  Mashed potatoes, 1/2 cup  Pureed cooled broccoli, 1/2 cup  Apple sauce, 1/2 cup  Coffee or tea  Mashed potatoes, 1/2 cup  Pureed spinach, 1/2 cup  Frozen yogurt, 1/2 cup  Tea or coffee      LEVEL 2 = SOFT DIET  After your first 2 weeks, you can advance to a soft diet.   Keep on this  diet until everything goes down easily.  Hot Foods Cold Foods  White fish Cottage cheese  Stuffed fish Junior baby fruit  Baby food meals Semi thickened juices  Minced soft cooked, scrambled, poached eggs nectars  Souffle & omelets Ripe mashed bananas  Cooked cereals Canned fruit, pineapple sauce, milk  potatoes Milkshake  Buttered or Alfredo noodles Custard  Cooked cooled vegetable Puddings, including tapioca  Sherbet Yogurt  Vegetable soup or alphabet soup Fruit ice, [place] ice  Gravies Whipped gelatin  Sugar, syrup, honey, jelly Junior baby desserts  Sauces:  Cheese, creamed, barbecue, tomato, white Cream  Coffee or tea Margarine   SAMPLE MENU:  LEVEL 2 Breakfast Lunch Dinner   Orange juice, 1/2 cup  Oatmeal, 1/2 cup  Scrambled eggs with cheese, 1/2 cup  Decaffeinated tea, 1 cup  Whole milk, 1 cup  Non-dairy creamer, 2 Tbsp  Pineapple juice, 1/2 cup  Minced beef, 3 oz  Gravy, 2 Tbsp  Mashed potatoes, 1/2 cup  Minced fresh broccoli, 1/2 cup  Applesauce, 1/2 cup  Coffee, 1 cup  [place], barley soup, 3/4 cup  Minced Hawaiian chicken, 3 oz  Mashed potatoes, 1/2 cup  Cooked spinach, 1/2 cup  Frozen yogurt, 1/2 cup  Non-dairy creamer, 2 Tbsp      LEVEL 3 = CHOPPED DIET  -After all the foods in level 2 (soft diet) are passing through well you should advance up to more chopped foods.  -It is still important to cut these foods into small pieces and eat slowly.  Hot Foods Cold Foods  Poultry Cottage cheese  Chopped Swedish meatballs Yogurt  Meat salads (ground or flaked meat) Milk  Flaked fish (tuna) Milkshakes  Poached or scrambled eggs Soft, cold, dry cereal  Souffles and omelets Fruit juices or nectars  Cooked cereals Chopped canned fruit  Chopped [place] toast or pancakes Canned fruit cocktail  Noodles or pasta (no rice) Pudding, mousse, custard  Cooked vegetables (no frozen peas, corn, or mixed vegetables) Green salad  Canned small sweet peas  Ice cream  Creamed soup or vegetable soup Fruit ice, [place] ice  Pureed vegetable soup or alphabet soup Non-dairy creamer  Ground scalloped apples Margarine  Gravies Mayonnaise  Sauces:  Cheese, creamed, barbecue, tomato, white Ketchup  Coffee or tea Mustard   SAMPLE MENU:  LEVEL 3 Breakfast Lunch Dinner   Orange juice, 1/2 cup  Oatmeal, 1/2 cup  Scrambled eggs with cheese, 1/2 cup  Decaffeinated tea, 1 cup  Whole milk, 1 cup  Non-dairy creamer, 2 Tbsp  Ketchup, 1 Tbsp  Margarine, 1 tsp  Salt, 1/4 tsp  Sugar, 2 tsp  Pineapple juice, 1/2 cup  Ground beef, 3 oz  Gravy, 2 Tbsp  Mashed potatoes, 1/2 cup  Cooked spinach, 1/2 cup  Applesauce, 1/2 cup  Decaffeinated coffee  Whole milk  Non-dairy creamer, 2 Tbsp  Margarine, 1 tsp  Salt, 1/4 tsp  Pureed [place], barley soup, 3/4 cup  Barbecue chicken, 3 oz  Mashed potatoes, 1/2 cup  Ground fresh broccoli, 1/2 cup  Frozen yogurt, 1/2 cup  Decaffeinated tea, 1 cup  Non-dairy creamer, 2 Tbsp  Margarine, 1 tsp  Salt, 1/4 tsp  Sugar, 1 tsp    LEVEL 4:  REGULAR FOODS  -Foods in this group are soft, moist, regularly textured foods.   -This level includes meat and breads, which tend to be the hardest things to swallow.   -Eat very slowly, chew well and continue to avoid carbonated drinks. -most people are at this level in 4-6 weeks  Hot Foods Cold Foods  Baked fish or skinned Soft cheeses - cottage cheese  Souffles and omelets Cream cheese  Eggs Yogurt  Stuffed shells Milk  Spaghetti with meat sauce Milkshakes  Cooked cereal Cold dry cereals (no nuts, dried fruit, coconut)  [place] toast or pancakes Crackers  Buttered toast Fruit juices or nectars  Noodles or pasta (no rice) Canned fruit  Potatoes (all types) Ripe bananas  Soft, cooked vegetables (no corn, lima, or baked beans) Peeled, ripe, fresh fruit  Creamed soups or vegetable soup Cakes (no nuts, dried fruit, coconut)  Canned chicken  noodle soup Plain doughnuts  Gravies Ice cream  Bacon dressing Pudding, mousse, custard  Sauces:  Cheese, creamed, barbecue, tomato, white Fruit ice, [place] ice, sherbet  Decaffeinated tea or coffee Whipped gelatin  Pork chops Regular gelatin   Canned fruited gelatin molds   Sugar, syrup, honey, jam, jelly   Cream   Non-dairy   Margarine   Oil   Mayonnaise   Ketchup   Mustard   TROUBLESHOOTING IRREGULAR BOWELS  1) Avoid extremes of bowel movements (no bad constipation/diarrhea)  2) Miralax 17gm mixed in 8oz. water or juice-daily. May use BID as needed.  3) Gas-x,Phazyme, etc. as needed for gas & bloating.  4) Soft,bland diet. No spicy,greasy,fried foods.  5) Prilosec over-the-counter as needed  6) May hold gluten/wheat products from diet to see if symptoms improve.  7) May try probiotics (Align, Activa, etc) to help calm the bowels down  7) If symptoms become worse call back immediately.    If you have any questions regarding your incisions please call our office at [facility]: [phone].

## 2019-12-18 NOTE — Progress Notes (Signed)
[facility] ROUNDING NOTE   Subjective: Patient doing well s/p laparoscopic hiatal hernia repair and TIF earlier this morning.  Tolerating clear liquid diet without issue.  Minimal irritation in esophagus, otherwise without any pain.  Not requiring any pain medications.  Objective: Vital signs in last 24 hours: Temp:  [97.8 F (36.6 C)-98.1 F (36.7 C)] 98.1 F (36.7 C) (10/22 1035) Pulse Rate:  [69-93] 70 (10/22 1130) Resp:  [11-16] 14 (10/22 1130) BP: (115-123)/(68-81) 115/68 (10/22 1130) SpO2:  [97 %-100 %] 100 % (10/22 1130)   General: NAD Abdomen: Minimal TTP incision points, otherwise soft, ND, +BS Ext:  No c/c/e    Intake/Output from previous day: No intake/output data recorded. Intake/Output this shift: Total I/O In: 600 [I.V.:600] Out: 75 [Urine:50; Blood:25]   Lab Results: Recent Labs    12/15/19 1438  WBC 5.0  HGB 13.1  PLT 350  MCV 90.3   BMET Recent Labs    12/15/19 1438  NA 139  K 3.5  CL 111  CO2 16*  GLUCOSE 136*  BUN 15  CREATININE 0.83  CALCIUM 9.0   LFT Recent Labs    12/15/19 1438  PROT 6.9  ALBUMIN 4.1  AST 14*  ALT 12  ALKPHOS 44  BILITOT 0.8   PT/INR No results for input(s): INR in the last 72 hours.    Imaging/Other results: No results found.    Assessment and Plan:  Carrie Nelson is a 39 y.o. female s/p laparoscopic hiatal hernia repair and patient EGD with Transoral Incisionless Fundoplication (TIF) completed earlier this morning.  Has been doing well in recovery, and stable for discharge. Will plan on d/c to home today with the following plan:   -Nexium 40 mg PO BID for 2 weeks, then 40 mg daily for 2 weeks, then 20 mg daily for 1 week, then prn  - D/c with Zofran 4 mg PO prn Q6 hours for nausea  - D/c with Reglan 10 mg PO prn Q6 hours for nausea  -Okay to use OTC simethicone 80 mg PO prn Q6 hours for bloating/abdominal discomfort - OTC Tylenol as needed for mild postoperative pain -Has pain  medication called in by Dr. Andrey Campanile for prn use  Diet:  - 2 weeks of liquid soft foods followed by 4 weeks slowly progressive diet back to regular  - Provided with handout for post operative diet plan   Post Op Activity:  - Week 1: encourage short distance walking, minimal physical activity, no lifting >5 lbs  - Week 2: Slow climbing stairs, no intense exercise, no lifting >5 lbs  - Week 3-6: No intense exercise, may lift up to 25 lbs  - Week 7: Resume normal activity   - To follow-up with me in the [facility] at [facility] at appointment to be scheduled in the next 2-3 weeks.   Shellia Cleverly, DO  12/18/2019, 12:15 PM [facility] Pager [phone] [place], DO  12/18/2019, 12:16 PM [facility] Pager [phone]

## 2019-12-21 ENCOUNTER — Encounter (HOSPITAL_COMMUNITY): Payer: Self-pay | Admitting: General Surgery

## 2020-01-04 ENCOUNTER — Encounter: Payer: Self-pay | Admitting: Gastroenterology

## 2020-01-04 ENCOUNTER — Ambulatory Visit: Payer: 59 | Admitting: Gastroenterology

## 2020-01-04 VITALS — BP 124/84 | HR 115 | Ht 62.0 in | Wt 144.0 lb

## 2020-01-04 DIAGNOSIS — R0989 Other specified symptoms and signs involving the circulatory and respiratory systems: Secondary | ICD-10-CM

## 2020-01-04 DIAGNOSIS — Z9889 Other specified postprocedural states: Secondary | ICD-10-CM

## 2020-01-04 DIAGNOSIS — R6889 Other general symptoms and signs: Secondary | ICD-10-CM

## 2020-01-04 DIAGNOSIS — K219 Gastro-esophageal reflux disease without esophagitis: Secondary | ICD-10-CM | POA: Diagnosis not present

## 2020-01-04 MED ORDER — ESOMEPRAZOLE MAGNESIUM 40 MG PO CPDR: 40.0000 mg | DELAYED_RELEASE_CAPSULE | Freq: Every day | ORAL | 0 refills | Status: DC

## 2020-01-04 NOTE — Patient Instructions (Signed)
If you are age 41 or older, your body mass index should be between 23-30. Your Body mass index is 26.34 kg/m. If this is out of the aforementioned range listed, please consider follow up with your Primary Care Provider.  If you are age 67 or younger, your body mass index should be between 19-25. Your Body mass index is 26.34 kg/m. If this is out of the aformentioned range listed, please consider follow up with your Primary Care Provider.   We have sent the following medications to your pharmacy for you to pick up at your convenience:  Nexium 40 mg 1 daily for 2 weeks then discontinue  Due to recent changes in healthcare laws, you may see the results of your imaging and laboratory studies on MyChart before your provider has had a chance to review them.  We understand that in some cases there may be results that are confusing or concerning to you. Not all laboratory results come back in the same time frame and the provider may be waiting for multiple results in order to interpret others.  Please give [place] 48 hours in order for your provider to thoroughly review all the results before contacting the office for clarification of your results.   Thank you for choosing me and [facility]  Dr Barron Alvine

## 2020-01-04 NOTE — Progress Notes (Signed)
P  Chief Complaint:    Postoperative follow-up  GI History: Carrie Nelson is a 39 year old female with a history of multiple sclerosis (received Lemtrada), hypothyroidism s/p iodine ablation with subsequent hypothyroidism (on Synthroid),  initially referred to me by Dr. Rhea Belton for evaluation of possible antireflux surgery.  GERD history: -Index symptoms: Regurgitation, increased throat clearing, globus sensation.  Increased nocturnal throat clearing.  No dysphagia or heartburn -Exacerbating factors: Increased postprandial symptoms -Medications trialed: Nexium -Current medications: Nexium 40 mg/day -Complications: LPR, LES laxity with small HH   GERD evaluation: -Last EGD: 05/07/2019.  Images reviewed by me today -Barium esophagram:  09/2019: Normal motility -Esophageal Manometry: Normal, no HH -pH/Impedance (on PPI): Good acid control with [place] 2.2, increased reflux events with increased esophageal bolus exposure with predominantly weakly acid reflux (95 events with 92 being weakly acid) -recommended consideration of antireflux surgery -Laryngoscopy by ENT in [place] in 01/2019 with e/o reflux irritation.  Endoscopic History: -EGD (04/27/2019, Dr. 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 female presenting to the Gastroenterology Clinic for post operative follow-up following lap HH repair and TIF (cTIF) on 12/18/19.   She is still having symptoms of increased throat clearing.  Also with new symptoms of "not being able to take a deep breath".  Still taking Nexium 40 mg BID as prescribed. Advanced diet to phase 2 this past weekend.     Review of systems:     No chest pain, no SOB, no fevers, no urinary sx   Past Medical History:  Diagnosis Date  . Complication  of anesthesia   . GERD (gastroesophageal reflux disease)   . Hypothyroidism   . Migraines   . Multiple sclerosis (HCC)   . PONV (postoperative nausea and vomiting)   . Syncope   . Thyrotoxicosis   . Tremor     Patient's surgical history, family medical history, social history, medications and allergies were all reviewed in Epic    Current Outpatient Medications  Medication Sig Dispense Refill  . Alemtuzumab (LEMTRADA) 12 MG/1.2ML SOLN Inject 12 mg into the vein.     Marland Kitchen ALPRAZolam (XANAX) 0.5 MG tablet Take 1 tablet (0.5 mg total) by mouth at bedtime as needed for anxiety. 90 tablet 2  . cholecalciferol (VITAMIN D) 1000 UNITS tablet Take 2,000 Units by mouth daily.     Marland Kitchen esomeprazole (NEXIUM) 40 MG capsule Take 1 capsule (40 mg total) by mouth 2 (two) times daily before a meal. 30 capsule 1  . FLUoxetine (PROZAC) 20 MG capsule Pt is taking 20mg  daily (Patient taking differently: Take 20 mg by mouth daily. ) 90 capsule 4  . loratadine (CLARITIN) 10 MG tablet Take 10 mg by mouth daily.    . methylphenidate (CONCERTA) 36 MG PO CR tablet TAKE 2 TABLETS BY MOUTH DAILY AS NEEDED (Patient taking differently: Take 72 mg by mouth daily. ) 90 tablet 0  . montelukast (SINGULAIR) 10 MG tablet Take 10 mg by mouth at bedtime.   3  . Multiple Vitamins-Minerals (MULTIVITAMIN PO) Take by mouth.     . Norethindrone Acetate-Ethinyl Estradiol (LARIN 1.5/30) 1.5-30 MG-MCG tablet Take 1 tablet by mouth daily. 63 tablet 5  . saccharomyces boulardii (FLORASTOR) 250 MG capsule Take by mouth.     . SYNTHROID 150 MCG tablet  Take 150 mcg by mouth daily.    Marland Kitchen topiramate (TOPAMAX) 50 MG tablet One or two po nightly (Patient taking differently: Take 50 mg by mouth daily. ) 180 tablet 3  . XIIDRA 5 % SOLN Place 1 drop into both eyes in the morning and at bedtime.   6   No current facility-administered medications for this visit.    Physical Exam:     BP 124/84   Pulse (!) 115   Ht 5\' 2"  (1.575 m)   Wt 144 lb  (65.3 kg)   BMI 26.34 kg/m   GENERAL:  Pleasant female in NAD PSYCH: : Cooperative, normal affect NEURO: Alert and oriented x 3, no focal neurologic deficits   IMPRESSION and PLAN:    1) History of fundoplication 2) History of hiatal hernia repair 3) Increased throat clearing -Longstanding history of reflux with LPR symptoms and regurgitation, now s/p cTIF 12/18/2019.  Still with some symptoms of increased throat clearing.  Discussed possibility of ongoing reflux vs superimposed hypersensitivity or even functional symptoms.  Did have good acid control when on PPI on previous pH study, but significant nonacid breakthrough.  This should have corrected with cTIF, so less suspicious for continued regurgitant.  Plan for the following:  -Continue PPI wean as planned with Nexium 40 mg/day x14 days, then will discontinue -Observe for clinical improvement with continued postoperative healing -Discussed hypersensitivity overlap.  Currently taking Prozac which could mitigate some of those symptoms -Discussed potential for repeat evaluation with pH/impedance testing.  However, would rather give tincture of time  RTC in 3 months or sooner as needed  I spent 35 minutes of time, including in depth chart review, independent review of results as outlined above, communicating results with the patient directly, face-to-face time with the patient, coordinating care, and ordering studies and medications as appropriate, and documentation.           12/20/2019 ,DO, FACG 01/04/2020, 11:46 AM

## 2020-01-07 DIAGNOSIS — H5213 Myopia, bilateral: Secondary | ICD-10-CM | POA: Diagnosis not present

## 2020-01-11 ENCOUNTER — Encounter: Payer: Self-pay | Admitting: Neurology

## 2020-01-11 ENCOUNTER — Ambulatory Visit: Payer: 59 | Admitting: Neurology

## 2020-01-11 ENCOUNTER — Other Ambulatory Visit: Payer: Self-pay

## 2020-01-11 ENCOUNTER — Other Ambulatory Visit: Payer: Self-pay | Admitting: Optometrist

## 2020-01-11 VITALS — BP 125/78 | HR 113 | Ht 62.0 in | Wt 144.5 lb

## 2020-01-11 DIAGNOSIS — R269 Unspecified abnormalities of gait and mobility: Secondary | ICD-10-CM

## 2020-01-11 DIAGNOSIS — F988 Other specified behavioral and emotional disorders with onset usually occurring in childhood and adolescence: Secondary | ICD-10-CM | POA: Diagnosis not present

## 2020-01-11 DIAGNOSIS — Z79899 Other long term (current) drug therapy: Secondary | ICD-10-CM | POA: Diagnosis not present

## 2020-01-11 DIAGNOSIS — G43009 Migraine without aura, not intractable, without status migrainosus: Secondary | ICD-10-CM | POA: Diagnosis not present

## 2020-01-11 DIAGNOSIS — G35 Multiple sclerosis: Secondary | ICD-10-CM | POA: Diagnosis not present

## 2020-01-11 DIAGNOSIS — E0590 Thyrotoxicosis, unspecified without thyrotoxic crisis or storm: Secondary | ICD-10-CM | POA: Diagnosis not present

## 2020-01-11 MED ORDER — METHYLPHENIDATE HCL ER (OSM) 36 MG PO TBCR
EXTENDED_RELEASE_TABLET | ORAL | 0 refills | Status: DC
Start: 2020-01-11 — End: 2020-05-11

## 2020-01-11 NOTE — Progress Notes (Signed)
[facility]  PATIENT: Carrie Nelson DOB: 02/14/1981  REFERRING CLINICIAN: Daniel Nones HISTORY FROM: Patient   REASON FOR VISIT: Multiple Sclerosis   HISTORICAL  CHIEF COMPLAINT:  Chief Complaint  Patient presents with  . Follow-up    RM 12, alone. Last seen 07/07/2019. PCP lowered synthroid dose from to last month.   . Multiple Sclerosis    On Lemtrada.   . Fatigue    Takes concerta  . Migraine    Takes topamax                                               f                                                                    HISTORY OF PRESENT ILLNESS:  Carrie Nelson is a 39 y.o. woman who was diagnosed with relapsing remitting MS in 2007.    Update 01/11/2020: She feels her MS has been stable.    She denies any MS exacerbation or new symptom.  She did [place] in November 2017 and November 2018.    She is compliant with REMS.    Gait is fine.  The right leg sometimes feels heavy.   She denies any difficulty with her arms.   She sometimes notes some paresthesias on the left side of her face, This sis worse when hot.   Bladder function is good.     She is sleeping well on xanax in general.   She has some MS related ADD and Concerta has helped. Migraines have done well on topiramate.  Mood is doing well on fluoxetine.    She had hyperthyroidism and she had radioactive iodine ablation.   She has hyothyroidism now and is on synthroid.    She has had low TSH the last 3 months.    She has symptoms of hyperthyroidism including insomnia, heart racing etc.     MS History:    She was diagnosed in 2007 but,  in retrospect, had right foot dragging that started around 2005. Then shortly after that she had an episode of left facial numbness. In 2007 she had an episode of slurred speech and worsening clumsiness.  MRI of the brain that was consistent with multiple sclerosis. She also had a lumbar puncture and her CSF compatible with the diagnosis of  MS. Most of her symptoms improved over the next few months but she did not get 100% back to baseline and she continues to have minimal residual weakness, clumsiness and numbness. She was initially placed on Betaseron but switched to Tysabri because of some generalized side effects of Betaseron. She was seeing Dr. Tinnie Gens 2007 until 2014.   She was on Tysabri between 2008 and late 2014 when she stopped because of a desire to get pregnant. She became pregnant in early 2015 and delivered in November 2015. She had not been on any disease modifying therapy since late 2014 and then started Tecfidera 02/2014.Marland Kitchen   She did not tolerate it well and started [facility] 12/2015.    Imaging: MRI of the brain 11/02/2015 shows  T2/FLAIR hyperintense foci in the pons and the periventricular, juxtacortical and deep white matter of both hemispheres in a pattern and configuration consistent with chronic demyelinating plaque associated with multiple sclerosis. None of the foci enhanced after contrast administration. Compared to the MRI dated 07/23/2014, there is no interval change.  MRI of the cervical spine 01/29/2013 showed a normal spinal cord and no significant degenerative changes.  REVIEW OF SYSTEMS:  Constitutional: No fevers, chills, sweats, or change in appetite Eyes: No visual changes, double vision, eye pain Ear, nose and throat: No hearing loss, ear pain, nasal congestion, sore throat Cardiovascular: No chest pain, palpitations Respiratory:  No shortness of breath at rest or with exertion.   No wheezes GastrointestinaI: No nausea, vomiting, diarrhea, abdominal pain, fecal incontinence Genitourinary:  No dysuria, urinary retention or frequency.  No nocturia. Musculoskeletal:  No neck pain, back pain Integumentary: No rash, pruritus, skin lesions Neurological: as above Psychiatric: No depression at this time.  No anxiety.   Occasionally she has irritability. Endocrine: She she is borderline diabetic. No  palpitations, diaphoresis, change in appetite, change in weigh or increased thirst Hematologic/Lymphatic:  No anemia, purpura, petechiae. Allergic/Immunologic: She has seasonal allergies and is treated with several medications.  ALLERGIES: No Known Allergies  HOME MEDICATIONS: Outpatient Medications Prior to Visit  Medication Sig Dispense Refill  . Alemtuzumab (LEMTRADA) 12 MG/1.2ML SOLN Inject 12 mg into the vein.     Marland Kitchen ALPRAZolam (XANAX) 0.5 MG tablet Take 1 tablet (0.5 mg total) by mouth at bedtime as needed for anxiety. 90 tablet 2  . cholecalciferol (VITAMIN D) 1000 UNITS tablet Take 2,000 Units by mouth daily.     Marland Kitchen esomeprazole (NEXIUM) 40 MG capsule Take 1 capsule (40 mg total) by mouth daily at 12 noon. 14 capsule 0  . FLUoxetine (PROZAC) 20 MG capsule Pt is taking 20mg  daily (Patient taking differently: Take 20 mg by mouth daily. ) 90 capsule 4  . loratadine (CLARITIN) 10 MG tablet Take 10 mg by mouth daily.    . montelukast (SINGULAIR) 10 MG tablet Take 10 mg by mouth at bedtime.   3  . Multiple Vitamins-Minerals (MULTIVITAMIN PO) Take by mouth.     . Norethindrone Acetate-Ethinyl Estradiol (LARIN 1.5/30) 1.5-30 MG-MCG tablet Take 1 tablet by mouth daily. 63 tablet 5  . saccharomyces boulardii (FLORASTOR) 250 MG capsule Take by mouth.     . SYNTHROID 150 MCG tablet Take 150 mcg by mouth daily.    Marland Kitchen topiramate (TOPAMAX) 50 MG tablet One or two po nightly (Patient taking differently: Take 50 mg by mouth daily. ) 180 tablet 3  . XIIDRA 5 % SOLN Place 1 drop into both eyes in the morning and at bedtime.   6  . methylphenidate (CONCERTA) 36 MG PO CR tablet TAKE 2 TABLETS BY MOUTH DAILY AS NEEDED (Patient taking differently: Take 72 mg by mouth daily. ) 90 tablet 0   No facility-administered medications prior to visit.    PAST MEDICAL HISTORY: Past Medical History:  Diagnosis Date  . Complication of anesthesia   . GERD (gastroesophageal reflux disease)   . Hypothyroidism   .  Migraines   . Multiple sclerosis (HCC)   . PONV (postoperative nausea and vomiting)   . Syncope   . Thyrotoxicosis   . Tremor     PAST SURGICAL HISTORY: Past Surgical History:  Procedure Laterality Date  . 24 HOUR PH STUDY N/A 06/24/2019   Procedure: 24 HOUR PH STUDY;  Surgeon: Napoleon Form, MD;  Location: [facility];  Service: Endoscopy;  Laterality: N/A;  . BUNIONECTOMY Left   . CESAREAN SECTION  01/22/2014  . ESOPHAGEAL MANOMETRY N/A 06/24/2019   Procedure: ESOPHAGEAL MANOMETRY (EM);  Surgeon: Napoleon Form, MD;  Location: [facility];  Service: Endoscopy;  Laterality: N/A;  . ESOPHAGOGASTRODUODENOSCOPY N/A 12/18/2019   Procedure: ESOPHAGOGASTRODUODENOSCOPY (EGD);  Surgeon: Shellia Cleverly, DO;  Location: [facility];  Service: Gastroenterology;  Laterality: N/A;  . HIATAL HERNIA REPAIR N/A 12/18/2019   Procedure: LAPAROSCOPIC REPAIR OF HIATAL HERNIA;  Surgeon: Gaynelle Adu, MD;  Location: [facility];  Service: General;  Laterality: N/A;  . KNEE ARTHROSCOPY W/ ACL RECONSTRUCTION Left 2010  . PH IMPEDANCE STUDY N/A 06/24/2019   Procedure: PH IMPEDANCE STUDY;  Surgeon: Napoleon Form, MD;  Location: [facility];  Service: Endoscopy;  Laterality: N/A;  . TRANSORAL INCISIONLESS FUNDOPLICATION N/A 12/18/2019   Procedure: TRANSORAL INCISIONLESS FUNDOPLICATION;  Surgeon: Shellia Cleverly, DO;  Location: [facility];  Service: Gastroenterology;  Laterality: N/A;  . UPPER GASTROINTESTINAL ENDOSCOPY  05/07/2019    FAMILY HISTORY: Family History  Problem Relation Age of Onset  . Prostate cancer Father   . Diabetes Father   . Arthritis Other   . Dementia Other   . Hypertension Other   . Heart disease Other   . Kidney disease Maternal Grandmother   . Esophageal cancer Maternal Grandfather   . Heart disease Paternal Grandfather   . Colon cancer Neg Hx   . Rectal cancer Neg Hx   . Stomach cancer Neg Hx     SOCIAL HISTORY:  Social History   Socioeconomic History  .  Marital status: Married    Spouse name: Not on file  . Number of children: 1  . Years of education: Not on file  . Highest education level: Not on file  Occupational History  . 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 Smoking status: Never Smoker  . Smokeless tobacco: Never Used  Vaping Use  . Vaping Use: Never used  Substance and Sexual Activity  . Alcohol use: Yes    Comment: minimal weekly   . Drug use: No  . Sexual activity: Yes    Birth control/protection: Pill  Other Topics Concern  . Not on file  Social History Narrative   Patient lives with parents.   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    Patient has a Probation officer.    Social Determinants of Health   Financial Resource Strain:   . Difficulty of Paying Living Expenses: Not on file  Food Insecurity:   . Worried About Programme researcher, broadcasting/film/video in the Last Year: Not on file  . Ran Out of Food in the Last Year: Not on file  Transportation Needs:   . Lack of Transportation (Medical): Not on file  . Lack of Transportation (Non-Medical): Not on file  Physical Activity:   . Days of Exercise per Week: Not on file  . Minutes of Exercise per Session: Not on file  Stress:   . Feeling of Stress : Not on file  Social Connections:   . Frequency of Communication with Friends and Family: Not on file  . Frequency of Social Gatherings with Friends and Family: Not on file  . Attends Religious Services: Not on file  . Active Member of Clubs or Organizations: Not on file  . Attends Banker Meetings: Not on file  . Marital Status:  Not on file  Intimate Partner Violence:   . Fear of Current or Ex-Partner: Not on file  . Emotionally Abused: Not on file  . Physically Abused: Not on file  . Sexually Abused: Not on file     PHYSICAL EXAM  Vitals:   01/11/20 1252  BP: 125/78  Pulse: (!) 113  SpO2: 98%  Weight: 144 lb 8 oz (65.5 kg)    Height: 5\' 2"  (1.575 m)    Body mass index is 26.43 kg/m.   General: The patient is well-developed and well-nourished and in no acute distress   Neurologic Exam  Mental status: The patient is alert and oriented x 3 at the time of the examination. The patient has apparent normal recent and remote memory, with an apparently normal attention span and concentration ability.   Speech is normal.  Cranial nerves:   Extraocular muscles are intact.  Facial strength and sensation is normal.  Trapezius strength is normal.  Hearing is normal symmetric  Motor:  Muscle bulk and tone are normal. Strength is  5 / 5 in all 4 extremities.   Sensory: She has intact sensation to touch and vibration  Coordination: Finger-nose-finger and heel-to-shin is performed well.  Gait and station: Station and gait are normal.  Tandem gait is mildly wide.  Romberg is negative.  Reflexes: Deep tendon reflexes are symmetric and normal in arms. Mildly increased right knee DTR.         ASSESSMENT AND PLAN  High risk medication use  Multiple sclerosis (HCC) - Plan: MR BRAIN W WO CONTRAST  Gait disturbance  Attention deficit disorder, unspecified hyperactivity presence  Hyperthyroidism  Common migraine without intractability   1.   Continue Lemtrada REMS monitoring.   We will check an MRI of the brain early next year.   2.  Continue Concerta for fatigue. 3.  Topamax 100 mg nightly. 4.  Current lab work shows that she has hypothyroidism and she will discuss further with her primary care physician.  Synthroid dose has been reduced but will likely need to be reduced further  5.  return to see me in 6 months or sooner if there are new or worsening neurologic symptoms.  45-minute office visit with the majority of the time spent face-to-face for history and physical, discussion/counseling and decision-making.  Additional time with record review and documentation.  Tiziana Cislo A. , MD, PhD 01/11/2020, 2:33  PM Certified in Neurology, Clinical Neurophysiology, Sleep Medicine, Pain Medicine and Neuroimaging  [facility] [address] [place], [place] [phone]-

## 2020-01-12 ENCOUNTER — Telehealth: Payer: Self-pay | Admitting: Neurology

## 2020-01-12 NOTE — Telephone Encounter (Signed)
no to the covid questions MR Brain w/wo contrast Dr. Tressie Stalker [facility] Auth: NPR Ref # [id number]. Patient is scheduled at [facility] for 01/13/20.

## 2020-01-13 ENCOUNTER — Ambulatory Visit: Payer: 59

## 2020-01-13 ENCOUNTER — Other Ambulatory Visit: Payer: Self-pay

## 2020-01-13 DIAGNOSIS — G35 Multiple sclerosis: Secondary | ICD-10-CM | POA: Diagnosis not present

## 2020-01-13 DIAGNOSIS — G35D Multiple sclerosis, unspecified: Secondary | ICD-10-CM

## 2020-01-13 MED ORDER — GADOBENATE DIMEGLUMINE 529 MG/ML IV SOLN
15.0000 mL | Freq: Once | INTRAVENOUS | Status: AC | PRN
  Administered 2020-01-13: 15 mL via INTRAVENOUS

## 2020-01-25 DIAGNOSIS — E039 Hypothyroidism, unspecified: Secondary | ICD-10-CM | POA: Diagnosis not present

## 2020-02-10 DIAGNOSIS — M9901 Segmental and somatic dysfunction of cervical region: Secondary | ICD-10-CM | POA: Diagnosis not present

## 2020-02-10 DIAGNOSIS — R519 Headache, unspecified: Secondary | ICD-10-CM | POA: Diagnosis not present

## 2020-02-10 DIAGNOSIS — M5416 Radiculopathy, lumbar region: Secondary | ICD-10-CM | POA: Diagnosis not present

## 2020-02-10 DIAGNOSIS — M9903 Segmental and somatic dysfunction of lumbar region: Secondary | ICD-10-CM | POA: Diagnosis not present

## 2020-02-12 DIAGNOSIS — H16223 Keratoconjunctivitis sicca, not specified as Sjogren's, bilateral: Secondary | ICD-10-CM | POA: Diagnosis not present

## 2020-02-24 DIAGNOSIS — M9903 Segmental and somatic dysfunction of lumbar region: Secondary | ICD-10-CM | POA: Diagnosis not present

## 2020-02-24 DIAGNOSIS — M9901 Segmental and somatic dysfunction of cervical region: Secondary | ICD-10-CM | POA: Diagnosis not present

## 2020-02-24 DIAGNOSIS — R519 Headache, unspecified: Secondary | ICD-10-CM | POA: Diagnosis not present

## 2020-02-24 DIAGNOSIS — M5416 Radiculopathy, lumbar region: Secondary | ICD-10-CM | POA: Diagnosis not present

## 2020-03-17 ENCOUNTER — Other Ambulatory Visit: Payer: Self-pay | Admitting: Internal Medicine

## 2020-03-23 DIAGNOSIS — R519 Headache, unspecified: Secondary | ICD-10-CM | POA: Diagnosis not present

## 2020-03-23 DIAGNOSIS — M5416 Radiculopathy, lumbar region: Secondary | ICD-10-CM | POA: Diagnosis not present

## 2020-03-23 DIAGNOSIS — M9903 Segmental and somatic dysfunction of lumbar region: Secondary | ICD-10-CM | POA: Diagnosis not present

## 2020-03-23 DIAGNOSIS — M9901 Segmental and somatic dysfunction of cervical region: Secondary | ICD-10-CM | POA: Diagnosis not present

## 2020-04-11 ENCOUNTER — Ambulatory Visit
Admission: RE | Admit: 2020-04-11 | Discharge: 2020-04-11 | Disposition: A | Discharge status: Home / Self Care | Payer: 59 | Source: Ambulatory Visit | Attending: Obstetrics and Gynecology | Admitting: Obstetrics and Gynecology

## 2020-04-11 ENCOUNTER — Other Ambulatory Visit: Payer: Self-pay

## 2020-04-11 DIAGNOSIS — Z1231 Encounter for screening mammogram for malignant neoplasm of breast: Secondary | ICD-10-CM | POA: Insufficient documentation

## 2020-04-20 ENCOUNTER — Telehealth: Payer: Self-pay | Admitting: *Deleted

## 2020-04-20 NOTE — Telephone Encounter (Signed)
Called pt. Advised TSH level came back elevated at 8.020 on her monthly Lemtrada labs she had drawn on 04/19/20. She will f/u with PCP. She has appt tomorrow and will get labs redrawn as well. I faxed copy of lab results to PCP at [phone]. Received fax confirmation.

## 2020-04-21 DIAGNOSIS — D72819 Decreased white blood cell count, unspecified: Secondary | ICD-10-CM | POA: Diagnosis not present

## 2020-04-21 DIAGNOSIS — G35 Multiple sclerosis: Secondary | ICD-10-CM | POA: Diagnosis not present

## 2020-04-21 DIAGNOSIS — Z1159 Encounter for screening for other viral diseases: Secondary | ICD-10-CM | POA: Diagnosis not present

## 2020-04-21 DIAGNOSIS — E7849 Other hyperlipidemia: Secondary | ICD-10-CM | POA: Diagnosis not present

## 2020-04-27 DIAGNOSIS — M9901 Segmental and somatic dysfunction of cervical region: Secondary | ICD-10-CM | POA: Diagnosis not present

## 2020-04-27 DIAGNOSIS — M5416 Radiculopathy, lumbar region: Secondary | ICD-10-CM | POA: Diagnosis not present

## 2020-04-27 DIAGNOSIS — R519 Headache, unspecified: Secondary | ICD-10-CM | POA: Diagnosis not present

## 2020-04-27 DIAGNOSIS — M9903 Segmental and somatic dysfunction of lumbar region: Secondary | ICD-10-CM | POA: Diagnosis not present

## 2020-04-28 DIAGNOSIS — G35 Multiple sclerosis: Secondary | ICD-10-CM | POA: Diagnosis not present

## 2020-04-28 DIAGNOSIS — E7849 Other hyperlipidemia: Secondary | ICD-10-CM | POA: Diagnosis not present

## 2020-04-28 DIAGNOSIS — Z8639 Personal history of other endocrine, nutritional and metabolic disease: Secondary | ICD-10-CM | POA: Diagnosis not present

## 2020-04-28 DIAGNOSIS — J4520 Mild intermittent asthma, uncomplicated: Secondary | ICD-10-CM | POA: Diagnosis not present

## 2020-05-11 ENCOUNTER — Other Ambulatory Visit: Payer: Self-pay | Admitting: Neurology

## 2020-05-25 DIAGNOSIS — M5416 Radiculopathy, lumbar region: Secondary | ICD-10-CM | POA: Diagnosis not present

## 2020-05-25 DIAGNOSIS — M9903 Segmental and somatic dysfunction of lumbar region: Secondary | ICD-10-CM | POA: Diagnosis not present

## 2020-05-25 DIAGNOSIS — M9901 Segmental and somatic dysfunction of cervical region: Secondary | ICD-10-CM | POA: Diagnosis not present

## 2020-05-25 DIAGNOSIS — R519 Headache, unspecified: Secondary | ICD-10-CM | POA: Diagnosis not present

## 2020-06-09 ENCOUNTER — Encounter: Payer: Self-pay | Admitting: Neurology

## 2020-06-13 ENCOUNTER — Other Ambulatory Visit: Payer: Self-pay | Admitting: Internal Medicine

## 2020-06-13 ENCOUNTER — Other Ambulatory Visit: Payer: Self-pay

## 2020-06-13 MED FILL — Fluoxetine HCl Cap 20 MG: ORAL | 90 days supply | Qty: 90 | Fill #0 | Status: AC

## 2020-06-14 ENCOUNTER — Other Ambulatory Visit: Payer: Self-pay

## 2020-06-16 DIAGNOSIS — H16223 Keratoconjunctivitis sicca, not specified as Sjogren's, bilateral: Secondary | ICD-10-CM | POA: Diagnosis not present

## 2020-06-17 ENCOUNTER — Other Ambulatory Visit: Payer: Self-pay

## 2020-06-17 MED ORDER — ALPRAZOLAM 0.5 MG PO TABS
0.5000 mg | ORAL_TABLET | Freq: Every evening | ORAL | 1 refills | Status: DC | PRN
  Filled 2020-06-17: qty 90, 90d supply, fill #0
  Filled 2020-09-22: qty 90, 90d supply, fill #1

## 2020-06-20 ENCOUNTER — Other Ambulatory Visit: Payer: Self-pay

## 2020-06-22 ENCOUNTER — Other Ambulatory Visit: Payer: Self-pay

## 2020-06-22 DIAGNOSIS — M5416 Radiculopathy, lumbar region: Secondary | ICD-10-CM | POA: Diagnosis not present

## 2020-06-22 DIAGNOSIS — M9903 Segmental and somatic dysfunction of lumbar region: Secondary | ICD-10-CM | POA: Diagnosis not present

## 2020-06-22 DIAGNOSIS — R519 Headache, unspecified: Secondary | ICD-10-CM | POA: Diagnosis not present

## 2020-06-22 DIAGNOSIS — M9901 Segmental and somatic dysfunction of cervical region: Secondary | ICD-10-CM | POA: Diagnosis not present

## 2020-06-22 MED ORDER — SYNTHROID 150 MCG PO TABS
ORAL_TABLET | ORAL | 0 refills | Status: DC
  Filled 2020-06-22: qty 90, 90d supply, fill #0

## 2020-06-22 MED FILL — Norethindrone Ace & Ethinyl Estradiol Tab 1.5 MG-30 MCG: ORAL | 63 days supply | Qty: 63 | Fill #0 | Status: AC

## 2020-07-11 ENCOUNTER — Ambulatory Visit: Payer: 59 | Admitting: Neurology

## 2020-07-12 ENCOUNTER — Other Ambulatory Visit: Payer: Self-pay | Admitting: Neurology

## 2020-07-12 ENCOUNTER — Other Ambulatory Visit: Payer: Self-pay

## 2020-07-12 MED ORDER — TOPIRAMATE 50 MG PO TABS
ORAL_TABLET | ORAL | 0 refills | Status: DC
  Filled 2020-07-12: qty 180, 90d supply, fill #0

## 2020-07-19 ENCOUNTER — Encounter: Payer: Self-pay | Admitting: *Deleted

## 2020-07-20 DIAGNOSIS — M9901 Segmental and somatic dysfunction of cervical region: Secondary | ICD-10-CM | POA: Diagnosis not present

## 2020-07-20 DIAGNOSIS — R519 Headache, unspecified: Secondary | ICD-10-CM | POA: Diagnosis not present

## 2020-07-20 DIAGNOSIS — M5416 Radiculopathy, lumbar region: Secondary | ICD-10-CM | POA: Diagnosis not present

## 2020-07-20 DIAGNOSIS — M9903 Segmental and somatic dysfunction of lumbar region: Secondary | ICD-10-CM | POA: Diagnosis not present

## 2020-08-02 ENCOUNTER — Encounter: Payer: Self-pay | Admitting: *Deleted

## 2020-08-15 ENCOUNTER — Encounter: Payer: Self-pay | Admitting: *Deleted

## 2020-09-01 ENCOUNTER — Encounter: Payer: Self-pay | Admitting: Neurology

## 2020-09-01 ENCOUNTER — Other Ambulatory Visit: Payer: Self-pay

## 2020-09-01 MED FILL — Norethindrone Ace & Ethinyl Estradiol Tab 1.5 MG-30 MCG: ORAL | 63 days supply | Qty: 63 | Fill #1 | Status: AC

## 2020-09-12 ENCOUNTER — Encounter: Payer: Self-pay | Admitting: Neurology

## 2020-09-12 ENCOUNTER — Ambulatory Visit: Payer: 59 | Admitting: Neurology

## 2020-09-12 VITALS — BP 139/82 | HR 95 | Ht 62.0 in | Wt 171.0 lb

## 2020-09-12 DIAGNOSIS — E0590 Thyrotoxicosis, unspecified without thyrotoxic crisis or storm: Secondary | ICD-10-CM | POA: Diagnosis not present

## 2020-09-12 DIAGNOSIS — Z79899 Other long term (current) drug therapy: Secondary | ICD-10-CM | POA: Diagnosis not present

## 2020-09-12 DIAGNOSIS — F988 Other specified behavioral and emotional disorders with onset usually occurring in childhood and adolescence: Secondary | ICD-10-CM

## 2020-09-12 DIAGNOSIS — R269 Unspecified abnormalities of gait and mobility: Secondary | ICD-10-CM | POA: Diagnosis not present

## 2020-09-12 DIAGNOSIS — G35 Multiple sclerosis: Secondary | ICD-10-CM | POA: Diagnosis not present

## 2020-09-12 DIAGNOSIS — G43009 Migraine without aura, not intractable, without status migrainosus: Secondary | ICD-10-CM | POA: Diagnosis not present

## 2020-09-12 NOTE — Progress Notes (Signed)
[facility]  PATIENT: Carrie Nelson DOB: 11-21-1980  REFERRING CLINICIAN: Daniel Nones HISTORY FROM: Patient    REASON FOR VISIT: Multiple Sclerosis   HISTORICAL  CHIEF COMPLAINT:  Chief Complaint  Patient presents with   Follow-up    Rm 2,a lone. Here for MS f/u. Last OV 01/11/20, on Lemtrada. Pt reports being stable, no new or worsening sx. Pt has gained weight, states lifestyle has been better and could contribute to the weight gain.                                                f                                                                    HISTORY OF PRESENT ILLNESS:  [name] is a 40 y.o. woman who was diagnosed with relapsing remitting MS in 2007.    Update 09/12/2020: She feels her MS has been stable.    She denies any MS exacerbation or new symptom.  She did [place] in November 2017 and November 2018.    She is compliant with REMS.  We discussed that her last lab work will be in several more months.    She has had frequent UTIs - .   She has seen urology and no issues were found.    She feels she does not empty but does not have hesitation.   She is taking a probiotic and feels she may be doing better this month.  In general, the gait is doing well.  She has no fullness.  Wound on the right leg feels heavy.  She has no weakness in the arms.  She will have some paresthesias on the left side of the face, this is worse in hot weather.   She is sleeping well on xanax in general.   She has some MS related ADD and Concerta has helped.(Takes 36 mg most days and 72 mg occasional days.     Mood is doing well on fluoxetine.    She is separated and has had a lot of stress the past year but feels a little better recently.   She has had weight fluctuations.  She has gained some weight (171 pound now was 144 pounds 12/2019).     Migraines have done well on topiramate.  She had hyperthyroidism and she had radioactive iodine ablation.   She has  hyothyroidism now and is on synthroid.    She has had some fluctuations in level.        MS History:    She was diagnosed in 2007 but,  in retrospect, had right foot dragging that started around 2005. Then shortly after that she had an episode of left facial numbness. In 2007 she had an episode of slurred speech and worsening clumsiness.  MRI of the brain that was consistent with multiple sclerosis. She also had a lumbar puncture and her CSF compatible with the diagnosis of MS. Most of her symptoms improved over the next few months but she did not get 100% back to baseline and she continues to  have minimal residual weakness, clumsiness and numbness. She was initially placed on Betaseron but switched to Tysabri because of some generalized side effects of Betaseron. She was seeing Dr. Tinnie Gens 2007 until 2014.   She was on Tysabri between 2008 and late 2014 when she stopped because of a desire to get pregnant. She became pregnant in early 2015 and delivered in November 2015. She had not been on any disease modifying therapy since late 2014 and then started Tecfidera 02/2014.Marland Kitchen   She did not tolerate it well and started [facility] 12/2015.    Imaging: MRI of the brain 11/02/2015 shows T2/FLAIR hyperintense foci in the pons and the periventricular, juxtacortical and deep white matter of both hemispheres in a pattern and configuration consistent with chronic demyelinating plaque associated with multiple sclerosis. None of the foci enhanced after contrast administration. Compared to the MRI dated 07/23/2014, there is no interval change.  MRI of the cervical spine 01/29/2013 showed a normal spinal cord and no significant degenerative changes.  MRI 01/13/2020 showed multiple T2/FLAIR hyperintense foci in the hemispheres, left thalamus and left pons in a pattern and configuration consistent with chronic demyelinating plaque associated with multiple sclerosis.  None of the foci enhance or appear to be acute.  Compared to  the MRI from 07/23/2014, there are no new lesions.  REVIEW OF SYSTEMS:  Constitutional: No fevers, chills, sweats, or change in appetite Eyes: No visual changes, double vision, eye pain Ear, nose and throat: No hearing loss, ear pain, nasal congestion, sore throat Cardiovascular: No chest pain, palpitations Respiratory:  No shortness of breath at rest or with exertion.   No wheezes GastrointestinaI: No nausea, vomiting, diarrhea, abdominal pain, fecal incontinence Genitourinary:  No dysuria, urinary retention or frequency.  No nocturia. Musculoskeletal:  No neck pain, back pain Integumentary: No rash, pruritus, skin lesions Neurological: as above Psychiatric: No depression at this time.  No anxiety.   Occasionally she has irritability. Endocrine: She she is borderline diabetic. No palpitations, diaphoresis, change in appetite, change in weigh or increased thirst Hematologic/Lymphatic:  No anemia, purpura, petechiae. Allergic/Immunologic: She has seasonal allergies and is treated with several medications.  ALLERGIES: No Known Allergies  HOME MEDICATIONS: Outpatient Medications Prior to Visit  Medication Sig Dispense Refill   Alemtuzumab 12 MG/1.2ML SOLN Inject 12 mg into the vein.      ALPRAZolam (XANAX) 0.5 MG tablet TAKE 1 TABLET BY MOUTH NIGHTLY AS NEEDED FOR SLEEP 90 tablet 1   cholecalciferol (VITAMIN D) 1000 UNITS tablet Take 2,000 Units by mouth daily.      esomeprazole (NEXIUM) 40 MG capsule Take 1 capsule (40 mg total) by mouth daily at 12 noon. 14 capsule 0   FLUoxetine (PROZAC) 20 MG capsule TAKE 1 CAPSULE BY MOUTH ONCE DAILY (Patient taking differently: Take 20 mg by mouth daily.) 90 capsule 4   Lifitegrast 5 % SOLN INSTILL 1 DROP IN BOTH EYES 2 TIMES A DAY 180 each 3   loratadine (CLARITIN) 10 MG tablet Take 10 mg by mouth daily.     methylphenidate 36 MG PO CR tablet TAKE 2 TABLETS BY MOUTH DAILY AS NEEDED 180 tablet 0   montelukast (SINGULAIR) 10 MG tablet Take 10 mg by  mouth at bedtime.   3   Multiple Vitamins-Minerals (MULTIVITAMIN PO) Take by mouth.      Norethindrone Acetate-Ethinyl Estradiol (LOESTRIN) 1.5-30 MG-MCG tablet TAKE 1 TABLET BY MOUTH DAILY. 63 tablet 5   saccharomyces boulardii (FLORASTOR) 250 MG capsule Take by mouth.  SYNTHROID 150 MCG tablet TAKE 1 TABLET (150 MCG TOTAL) BY MOUTH ONCE DAILY. TAKE ON AN EMPTY STOMACH WITH A GLASS OF WATER AT LEAST 30-60 MINUTES BEFORE BREAKFAST. 90 tablet 0   topiramate (TOPAMAX) 50 MG tablet TAKE 1 TO 2 TABLETS BY MOUTH NIGHTLY AT BEDTIME 180 tablet 0   XIIDRA 5 % SOLN Place 1 drop into both eyes in the morning and at bedtime.   6   ALPRAZolam (XANAX) 0.5 MG tablet Take 1 tablet (0.5 mg total) by mouth at bedtime as needed for anxiety. 90 tablet 2   SYNTHROID 150 MCG tablet Take 150 mcg by mouth daily.     SYNTHROID 150 MCG tablet TAKE 1 TABLET (150 MCG TOTAL) BY MOUTH ONCE DAILY TAKE ON AN EMPTY STOMACH WITH A GLASS OF WATER AT LEAST 30-60 MINUTES BEFORE BREAKFAST. 90 tablet 0   No facility-administered medications prior to visit.    PAST MEDICAL HISTORY: Past Medical History:  Diagnosis Date   Complication of anesthesia    GERD (gastroesophageal reflux disease)    Hypothyroidism    Migraines    Multiple sclerosis (HCC)    PONV (postoperative nausea and vomiting)    Syncope    Thyrotoxicosis    Tremor     PAST SURGICAL HISTORY: Past Surgical History:  Procedure Laterality Date   83 HOUR PH STUDY N/A 06/24/2019   Procedure: 24 HOUR PH STUDY;  Surgeon: Napoleon Form, MD;  Location: [facility];  Service: Endoscopy;  Laterality: N/A;   BUNIONECTOMY Left    CESAREAN SECTION  01/22/2014   ESOPHAGEAL MANOMETRY N/A 06/24/2019   Procedure: ESOPHAGEAL MANOMETRY (EM);  Surgeon: Napoleon Form, MD;  Location: [facility];  Service: Endoscopy;  Laterality: N/A;   ESOPHAGOGASTRODUODENOSCOPY N/A 12/18/2019   Procedure: ESOPHAGOGASTRODUODENOSCOPY (EGD);  Surgeon: Shellia Cleverly, DO;   Location: [facility];  Service: Gastroenterology;  Laterality: N/A;   HIATAL HERNIA REPAIR N/A 12/18/2019   Procedure: LAPAROSCOPIC REPAIR OF HIATAL HERNIA;  Surgeon: Gaynelle Adu, MD;  Location: [facility];  Service: General;  Laterality: N/A;   KNEE ARTHROSCOPY W/ ACL RECONSTRUCTION Left 2010   PH IMPEDANCE STUDY N/A 06/24/2019   Procedure: PH IMPEDANCE STUDY;  Surgeon: Napoleon Form, MD;  Location: [facility];  Service: Endoscopy;  Laterality: N/A;   TRANSORAL INCISIONLESS FUNDOPLICATION N/A 12/18/2019   Procedure: TRANSORAL INCISIONLESS FUNDOPLICATION;  Surgeon: Shellia Cleverly, DO;  Location: [facility];  Service: Gastroenterology;  Laterality: N/A;   UPPER GASTROINTESTINAL ENDOSCOPY  05/07/2019    FAMILY HISTORY: Family History  Problem Relation Age of Onset   Prostate cancer Father    Diabetes Father    Arthritis Other    Dementia Other    Hypertension Other    Heart disease Other    Kidney disease Maternal Grandmother    Esophageal cancer Maternal Grandfather    Heart disease Paternal Grandfather    Colon cancer Neg Hx    Rectal cancer Neg Hx    Stomach cancer Neg Hx     SOCIAL HISTORY:  Social History   Socioeconomic History   Marital status: Married    Spouse name: Not on file   Number of children: 1   Years of education: Not on file   Highest education level: Not on file  Occupational History   Occupation: Tree surgeon  Tobacco Use   Smoking status: Never   Smokeless tobacco: Never  Vaping Use   Vaping Use: Never used  Substance and Sexual Activity  Alcohol use: Yes    Comment: minimal weekly    Drug use: No   Sexual activity: Yes    Birth control/protection: Pill  Other Topics Concern   Not on file  Social History Narrative   Patient lives with parents.   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   Patient is single.    Patient has a Probation officerBachelors degree.    Social Determinants of Health   Financial  Resource Strain: Not on file  Food Insecurity: Not on file  Transportation Needs: Not on file  Physical Activity: Not on file  Stress: Not on file  Social Connections: Not on file  Intimate Partner Violence: Not on file     PHYSICAL EXAM  Vitals:   09/12/20 1057  BP: 139/82  Pulse: 95  Weight: 171 lb (77.6 kg)  Height: 5\' 2"  (1.575 m)    Body mass index is 31.28 kg/m.   General: The patient is well-developed and well-nourished and in no acute distress   Neurologic Exam  Mental status: The patient is alert and oriented x 3 at the time of the examination. The patient has apparent normal recent and remote memory, with an apparently normal attention span and concentration ability.   Speech is normal.  Cranial nerves:   Extraocular muscles are intact.  Facial strength and sensation is normal.  Trapezius strength is normal.  Hearing is normal symmetric  Motor:  Muscle bulk and tone are normal. Strength is  5 / 5 in all 4 extremities.   Sensory: She has intact sensation to touch and vibration  Coordination: Finger-nose-finger and heel-to-shin is performed well.  Gait and station: Station and gait are normal.  Tandem gait is slightly wide.  Romberg is negative.  Reflexes: Deep tendon reflexes are symmetric and normal in arms. Mildly increased right knee DTR.         ASSESSMENT AND PLAN  Multiple sclerosis (HCC)  High risk medication use  Gait disturbance  Attention deficit disorder, unspecified hyperactivity presence  Hyperthyroidism  Common migraine without intractability    1.  Continue Lemtrada REMS monitoring until November 2022 (the 2 courses were November 2017 and November 2018).   We will check an MRI of the brain early next year to determine if there is any subclinical progression.  We discussed strategies if she has a relapse or significant change on MRI. 2.  Continue Concerta for fatigue and MS related ADD. 3.  Topamax 100 mg nightly. 4.  Stay active  and try to eat healthy to help with the weight loss.   5.  Return to see me in 6 months or sooner if there are new or worsening neurologic symptoms.  Carrie Nelson A. Epimenio FootSater, MD, PhD 09/12/2020, 12:32 PM Certified in Neurology, Clinical Neurophysiology, Sleep Medicine, Pain Medicine and Neuroimaging  [facility] [address], [address] [place], [place] [address] [phone]

## 2020-09-15 ENCOUNTER — Other Ambulatory Visit: Payer: Self-pay

## 2020-09-15 MED FILL — Fluoxetine HCl Cap 20 MG: ORAL | 90 days supply | Qty: 90 | Fill #1 | Status: AC

## 2020-09-22 ENCOUNTER — Other Ambulatory Visit: Payer: Self-pay

## 2020-09-29 ENCOUNTER — Encounter: Payer: Self-pay | Admitting: Neurology

## 2020-09-29 ENCOUNTER — Other Ambulatory Visit: Payer: Self-pay

## 2020-09-29 MED ORDER — SYNTHROID 150 MCG PO TABS
ORAL_TABLET | ORAL | 1 refills | Status: DC
  Filled 2020-09-29: qty 90, 90d supply, fill #0
  Filled 2021-01-04: qty 90, 90d supply, fill #1

## 2020-10-24 ENCOUNTER — Other Ambulatory Visit: Payer: Self-pay | Admitting: Neurology

## 2020-10-24 ENCOUNTER — Other Ambulatory Visit: Payer: Self-pay

## 2020-10-24 MED ORDER — TOPIRAMATE 50 MG PO TABS
ORAL_TABLET | ORAL | 0 refills | Status: DC
  Filled 2020-10-24 (×2): qty 180, 90d supply, fill #0

## 2020-11-01 DIAGNOSIS — E7849 Other hyperlipidemia: Secondary | ICD-10-CM | POA: Diagnosis not present

## 2020-11-01 DIAGNOSIS — Z8639 Personal history of other endocrine, nutritional and metabolic disease: Secondary | ICD-10-CM | POA: Diagnosis not present

## 2020-11-01 DIAGNOSIS — G35 Multiple sclerosis: Secondary | ICD-10-CM | POA: Diagnosis not present

## 2020-11-03 DIAGNOSIS — G35 Multiple sclerosis: Secondary | ICD-10-CM | POA: Diagnosis not present

## 2020-11-03 DIAGNOSIS — E7849 Other hyperlipidemia: Secondary | ICD-10-CM | POA: Diagnosis not present

## 2020-11-03 DIAGNOSIS — Z0000 Encounter for general adult medical examination without abnormal findings: Secondary | ICD-10-CM | POA: Diagnosis not present

## 2020-11-03 DIAGNOSIS — Z23 Encounter for immunization: Secondary | ICD-10-CM | POA: Diagnosis not present

## 2020-11-03 DIAGNOSIS — Z8639 Personal history of other endocrine, nutritional and metabolic disease: Secondary | ICD-10-CM | POA: Diagnosis not present

## 2020-11-08 ENCOUNTER — Other Ambulatory Visit: Payer: Self-pay | Admitting: Neurology

## 2020-11-08 ENCOUNTER — Other Ambulatory Visit: Payer: Self-pay

## 2020-11-08 MED FILL — Norethindrone Ace & Ethinyl Estradiol Tab 1.5 MG-30 MCG: ORAL | 63 days supply | Qty: 63 | Fill #2 | Status: AC

## 2020-11-08 MED FILL — Methylphenidate HCl Tab ER Osmotic Release (OSM) 36 MG: ORAL | 90 days supply | Qty: 180 | Fill #0 | Status: AC

## 2020-11-09 ENCOUNTER — Other Ambulatory Visit: Payer: Self-pay

## 2020-11-10 ENCOUNTER — Encounter: Payer: Self-pay | Admitting: Neurology

## 2020-11-24 ENCOUNTER — Other Ambulatory Visit: Payer: Self-pay

## 2020-11-24 ENCOUNTER — Encounter: Payer: Self-pay | Admitting: Obstetrics and Gynecology

## 2020-11-24 ENCOUNTER — Ambulatory Visit (INDEPENDENT_AMBULATORY_CARE_PROVIDER_SITE_OTHER): Payer: 59 | Admitting: Obstetrics and Gynecology

## 2020-11-24 VITALS — BP 112/71 | HR 89 | Ht 62.0 in | Wt 179.9 lb

## 2020-11-24 DIAGNOSIS — Z01419 Encounter for gynecological examination (general) (routine) without abnormal findings: Secondary | ICD-10-CM

## 2020-11-24 DIAGNOSIS — S62627A Displaced fracture of medial phalanx of left little finger, initial encounter for closed fracture: Secondary | ICD-10-CM | POA: Diagnosis not present

## 2020-11-24 MED ORDER — NORETHINDRONE ACET-ETHINYL EST 1.5-30 MG-MCG PO TABS
1.0000 | ORAL_TABLET | Freq: Every day | ORAL | 5 refills | Status: DC
  Filled 2020-11-24 – 2021-01-23 (×2): qty 63, 63d supply, fill #0
  Filled 2021-04-10: qty 63, 63d supply, fill #1
  Filled 2021-06-14: qty 63, 63d supply, fill #2
  Filled 2021-08-22: qty 63, 63d supply, fill #3
  Filled 2021-10-24: qty 63, 63d supply, fill #4

## 2020-11-24 NOTE — Progress Notes (Signed)
HPI:      Ms. Carrie Nelson is a 40 y.o. G1P1001 who LMP was No LMP recorded (lmp unknown). (Menstrual status: Oral contraceptives).  Subjective:   She presents today for her annual examination.  She says she is doing well.  Her medical care is stable.  (MS).  She sees Dr. Graciela Husbands for her general medical care.  She continues to take Synthroid.   She is on continuous OCPs and is not having menstrual bleeding.  She likes this method even though at this time she is not using pills for birth control. She recently purchased her own house and is very excited about this!!!!    Hx: The following portions of the patient's history were reviewed and updated as appropriate:             She  has a past medical history of Complication of anesthesia, GERD (gastroesophageal reflux disease), Hypothyroidism, Migraines, Multiple sclerosis (HCC), PONV (postoperative nausea and vomiting), Syncope, Thyrotoxicosis, and Tremor. She does not have any pertinent problems on file. She  has a past surgical history that includes Knee arthroscopy w/ ACL reconstruction (Left, 2010); Bunionectomy (Left); Cesarean section (01/22/2014); Upper gastrointestinal endoscopy (05/07/2019); Esophageal manometry (N/A, 06/24/2019); 24 hour ph study (N/A, 06/24/2019); PH impedance study (N/A, 06/24/2019); Hiatal hernia repair (N/A, 12/18/2019); Transoral incisionless fundoplication (N/A, 12/18/2019); and Esophagogastroduodenoscopy (N/A, 12/18/2019). Her family history includes Arthritis in an other family member; Dementia in an other family member; Diabetes in her father; Esophageal cancer in her maternal grandfather; Heart disease in her paternal grandfather and another family member; Hypertension in an other family member; Kidney disease in her maternal grandmother; Prostate cancer in her father. She  reports that she has never smoked. She has never used smokeless tobacco. She reports current alcohol use. She reports that she does not use  drugs. She has a current medication list which includes the following prescription(s): alprazolam, cholecalciferol, loratadine, methylphenidate, multiple vitamin, saccharomyces boulardii, synthroid, topiramate, xiidra, alemtuzumab, esomeprazole, fluoxetine, lifitegrast, montelukast, and norethindrone acetate-ethinyl estradiol. She has No Known Allergies.       Review of Systems:  Review of Systems  Constitutional: Denied constitutional symptoms, night sweats, recent illness, fatigue, fever, insomnia and weight loss.  Eyes: Denied eye symptoms, eye pain, photophobia, vision change and visual disturbance.  Ears/Nose/Throat/Neck: Denied ear, nose, throat or neck symptoms, hearing loss, nasal discharge, sinus congestion and sore throat.  Cardiovascular: Denied cardiovascular symptoms, arrhythmia, chest pain/pressure, edema, exercise intolerance, orthopnea and palpitations.  Respiratory: Denied pulmonary symptoms, asthma, pleuritic pain, productive sputum, cough, dyspnea and wheezing.  Gastrointestinal: Denied, gastro-esophageal reflux, melena, nausea and vomiting.  Genitourinary: Denied genitourinary symptoms including symptomatic vaginal discharge, pelvic relaxation issues, and urinary complaints.  Musculoskeletal: Denied musculoskeletal symptoms, stiffness, swelling, muscle weakness and myalgia.  Dermatologic: Denied dermatology symptoms, rash and scar.  Neurologic: Denied neurology symptoms, dizziness, headache, neck pain and syncope.  Psychiatric: Denied psychiatric symptoms, anxiety and depression.  Endocrine: Denied endocrine symptoms including hot flashes and night sweats.   Meds:   Current Outpatient Medications on File Prior to Visit  Medication Sig Dispense Refill   ALPRAZolam (XANAX) 0.5 MG tablet TAKE 1 TABLET BY MOUTH NIGHTLY AS NEEDED FOR SLEEP 90 tablet 1   cholecalciferol (VITAMIN D) 1000 UNITS tablet Take 2,000 Units by mouth daily.      loratadine (CLARITIN) 10 MG tablet Take  10 mg by mouth daily.     methylphenidate 36 MG PO CR tablet TAKE 2 TABLETS BY MOUTH DAILY AS NEEDED 180 tablet 0  Multiple Vitamins-Minerals (MULTIVITAMIN PO) Take by mouth.      saccharomyces boulardii (FLORASTOR) 250 MG capsule Take by mouth.      SYNTHROID 150 MCG tablet TAKE 1 TABLET (150 MCG TOTAL) BY MOUTH ONCE DAILY. TAKE ON AN EMPTY STOMACH WITH A GLASS OF WATER AT LEAST 30-60 MINUTES BEFORE BREAKFAST. 90 tablet 1   topiramate (TOPAMAX) 50 MG tablet TAKE 1 TO 2 TABLETS BY MOUTH NIGHTLY AT BEDTIME 180 tablet 0   XIIDRA 5 % SOLN Place 1 drop into both eyes in the morning and at bedtime.   6   Alemtuzumab 12 MG/1.2ML SOLN Inject 12 mg into the vein.  (Patient not taking: Reported on 11/24/2020)     esomeprazole (NEXIUM) 40 MG capsule Take 1 capsule (40 mg total) by mouth daily at 12 noon. (Patient not taking: Reported on 11/24/2020) 14 capsule 0   FLUoxetine (PROZAC) 20 MG capsule TAKE 1 CAPSULE BY MOUTH ONCE DAILY (Patient not taking: Reported on 11/24/2020) 90 capsule 4   Lifitegrast 5 % SOLN INSTILL 1 DROP IN BOTH EYES 2 TIMES A DAY (Patient not taking: Reported on 11/24/2020) 180 each 3   montelukast (SINGULAIR) 10 MG tablet Take 10 mg by mouth at bedtime.  (Patient not taking: Reported on 11/24/2020)  3   No current facility-administered medications on file prior to visit.     Upstream - 11/24/20 1017       Pregnancy Intention Screening   Does the patient want to become pregnant in the next year? No    Does the patient's partner want to become pregnant in the next year? No    Would the patient like to discuss contraceptive options today? No      Contraception Wrap Up   Current Method Oral Contraceptive    End Method Oral Contraceptive    Contraception Counseling Provided No            The pregnancy intention screening data noted above was reviewed. Potential methods of contraception were discussed. The patient elected to proceed with Oral Contraceptive.    Objective:      Vitals:   11/24/20 1000  BP: 112/71  Pulse: 89    Filed Weights   11/24/20 1000  Weight: 179 lb 14.4 oz (81.6 kg)              Physical examination General NAD, Conversant  HEENT Atraumatic; Op clear with mmm.  Normo-cephalic. Pupils reactive. Anicteric sclerae  Thyroid/Neck Smooth without nodularity or enlargement. Normal ROM.  Neck Supple.  Skin No rashes, lesions or ulceration. Normal palpated skin turgor. No nodularity.  Breasts: No masses or discharge.  Symmetric.  No axillary adenopathy.  Lungs: Clear to auscultation.No rales or wheezes. Normal Respiratory effort, no retractions.  Heart: NSR.  No murmurs or rubs appreciated. No periferal edema  Abdomen: Soft.  Non-tender.  No masses.  No HSM. No hernia  Extremities: Moves all appropriately.  Normal ROM for age. No lymphadenopathy.  Neuro: Oriented to PPT.  Normal mood. Normal affect.     Pelvic:   Vulva: Normal appearance.  No lesions.  Vagina: No lesions or abnormalities noted.  Support: Normal pelvic support.  Urethra No masses tenderness or scarring.  Meatus Normal size without lesions or prolapse.  Cervix: Normal appearance.  No lesions.  Large ectropion  Anus: Normal exam.  No lesions.  Perineum: Normal exam.  No lesions.        Bimanual   Uterus: Normal size.  Non-tender.  Mobile.  AV.  Adnexae: No masses.  Non-tender to palpation.  Cul-de-sac: Negative for abnormality.     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 Well woman exam with routine gynecological exam     Patient doing well.  Would like to continue OCPs.   Plan:            1.  Basic Screening Recommendations The basic screening recommendations for asymptomatic women were discussed with the patient during her visit.  The age-appropriate recommendations were discussed with her and the rational for the tests reviewed.  When I am informed by the patient that another primary care physician has previously obtained the age-appropriate tests and they are up-to-date, only outstanding tests are ordered and referrals given as necessary.  Abnormal results of tests will be discussed with her when all of her results are completed.  Routine preventative health maintenance measures emphasized: Exercise/Diet/Weight control, Tobacco Warnings, Alcohol/Substance use risks and Stress Management Mammogram ordered 2.  Continue OCPs Orders Orders Placed This Encounter  Procedures   MM DIGITAL SCREENING BILATERAL     Meds ordered this encounter  Medications   Norethindrone Acetate-Ethinyl Estradiol (LOESTRIN) 1.5-30 MG-MCG tablet    Sig: TAKE 1 TABLET BY MOUTH DAILY.    Dispense:  63 tablet    Refill:  5          F/U  No follow-ups on file.  Elonda Husky, M.D. 11/24/2020 10:42 AM

## 2020-11-29 DIAGNOSIS — S62627A Displaced fracture of medial phalanx of left little finger, initial encounter for closed fracture: Secondary | ICD-10-CM | POA: Diagnosis not present

## 2020-11-30 DIAGNOSIS — S62627A Displaced fracture of medial phalanx of left little finger, initial encounter for closed fracture: Secondary | ICD-10-CM | POA: Diagnosis not present

## 2020-12-01 ENCOUNTER — Other Ambulatory Visit (HOSPITAL_BASED_OUTPATIENT_CLINIC_OR_DEPARTMENT_OTHER): Payer: Self-pay | Admitting: Orthopedic Surgery

## 2020-12-01 ENCOUNTER — Other Ambulatory Visit: Payer: Self-pay | Admitting: Orthopedic Surgery

## 2020-12-01 ENCOUNTER — Encounter (HOSPITAL_BASED_OUTPATIENT_CLINIC_OR_DEPARTMENT_OTHER): Payer: Self-pay | Admitting: Orthopedic Surgery

## 2020-12-01 ENCOUNTER — Other Ambulatory Visit: Payer: Self-pay

## 2020-12-01 DIAGNOSIS — S62627A Displaced fracture of medial phalanx of left little finger, initial encounter for closed fracture: Secondary | ICD-10-CM

## 2020-12-01 NOTE — Progress Notes (Signed)
Spoke w/ via phone for pre-op interview--- pt Lab needs dos----  urine preg             Lab results------ no COVID test -----patient states asymptomatic no test needed Arrive at ------- 0700 on 12-08-2020 NPO after MN NO Solid Food.  Clear liquids from MN until--- 0600 Med rec completed Medications to take morning of surgery ----- synthroid, loestrin Diabetic medication ----- n/a Patient instructed no nail polish to be worn day of surgery Patient instructed to bring photo id and insurance card day of surgery Patient aware to have Driver (ride ) / caregiver for 24 hours after surgery --father, Carrie Nelson Patient Special Instructions ----- n/a Pre-Op special Istructions ----- case just added on today, pre-op orders pending Patient verbalized understanding of instructions that were given at this phone interview. Patient denies shortness of breath, chest pain, fever, cough at this phone interview.

## 2020-12-06 ENCOUNTER — Encounter: Payer: Self-pay | Admitting: Neurology

## 2020-12-06 NOTE — H&P (Signed)
Preoperative History & Physical Exam  Surgeon: Philipp Ovens, MD  Diagnosis: left small finger middle phalanx fracture  Planned Procedure: Procedure(s) (LRB): OPEN REDUCTION INTERNAL FIXATION (ORIF) PROXIMAL PHALANX (Left), left small finger  History of Present Illness:   Patient is a 40 y.o. female with symptoms consistent with  left small finger middle phalanx fracture who presents for surgical intervention. The risks, benefits and alternatives of surgical intervention were discussed and informed consent was obtained prior to surgery.  Past Medical History:  Past Medical History:  Diagnosis Date   ADHD (attention deficit hyperactivity disorder)    Allergic asthma, mild intermittent, uncomplicated    per pt no rescue inhaler because she is under control (foilowed by pcp)   Anxiety    Complication of anesthesia    Finger fracture, left    5th phalanx   Gait disturbance    due to MS   GERD (gastroesophageal reflux disease)    History of Graves' disease 2017   s/p RAI 03/ 2019;  hyperthyroidism attributed to [place] IV infusion   History of repair of hiatal hernia 12/18/2019   History of syncope    hx recurrent , 2004 normal MRI and echo,  per pt no syncope since 2004   Hypothyroidism, 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 Tedd Sias, per lov note in epic 06-12-2019 released to pcp   Migraines    Multiple sclerosis, relapsing-remitting [facility])    neurologist--- dr r. sater;  dx 2007   PONV (postoperative nausea and vomiting)    Tremor, essential    Wears glasses     Past Surgical History:  Past Surgical History:  Procedure Laterality Date   38 HOUR PH STUDY N/A 06/24/2019   Procedure: 24 HOUR PH STUDY;  Surgeon: Napoleon Form, MD;  Location: [facility];  Service: Endoscopy;  Laterality: N/A;   BUNIONECTOMY Left 2005   CESAREAN SECTION  01/22/2014   ESOPHAGEAL MANOMETRY N/A 06/24/2019   Procedure: ESOPHAGEAL MANOMETRY (EM);  Surgeon: Napoleon Form,  MD;  Location: [facility];  Service: Endoscopy;  Laterality: N/A;   ESOPHAGOGASTRODUODENOSCOPY N/A 12/18/2019   Procedure: ESOPHAGOGASTRODUODENOSCOPY (EGD);  Surgeon: Shellia Cleverly, DO;  Location: [facility];  Service: Gastroenterology;  Laterality: N/A;   HIATAL HERNIA REPAIR N/A 12/18/2019   Procedure: LAPAROSCOPIC REPAIR OF HIATAL HERNIA;  Surgeon: Gaynelle Adu, MD;  Location: [facility];  Service: General;  Laterality: N/A;   KNEE ARTHROSCOPY W/ ACL RECONSTRUCTION Left 03/2008   @[facility]    PH IMPEDANCE STUDY N/A 06/24/2019   Procedure: PH IMPEDANCE STUDY;  Surgeon: 06/26/2019, MD;  Location: [facility];  Service: Endoscopy;  Laterality: N/A;   TRANSORAL INCISIONLESS FUNDOPLICATION N/A 12/18/2019   Procedure: TRANSORAL INCISIONLESS FUNDOPLICATION;  Surgeon: 12/20/2019, DO;  Location: [facility];  Service: Gastroenterology;  Laterality: N/A;   UPPER GASTROINTESTINAL ENDOSCOPY  05/07/2019    Medications:  Prior to Admission medications   Medication Sig Start Date End Date Taking? Authorizing Provider  ALPRAZolam (XANAX) 0.5 MG tablet TAKE 1 TABLET BY MOUTH NIGHTLY AS NEEDED FOR SLEEP 06/17/20  Yes   loratadine (CLARITIN) 10 MG tablet Take 10 mg by mouth at bedtime.   Yes [provider]  methylphenidate 36 MG PO CR tablet TAKE 2 TABLETS BY MOUTH DAILY AS NEEDED Patient taking differently: Take 36 mg by mouth daily. 11/08/20 05/07/21 Yes Sater, 07/07/21, MD  Norethindrone Acetate-Ethinyl Estradiol (LOESTRIN) 1.5-30 MG-MCG tablet TAKE 1 TABLET BY MOUTH DAILY. Patient taking differently: Take 1 tablet by  mouth daily. 11/24/20 11/24/21 Yes Linzie Collin, MD  saccharomyces boulardii (FLORASTOR) 250 MG capsule Take 250 mg by mouth daily.   Yes [provider]  SYNTHROID 150 MCG tablet TAKE 1 TABLET (150 MCG TOTAL) BY MOUTH ONCE DAILY. TAKE ON AN EMPTY STOMACH WITH A GLASS OF WATER AT LEAST 30-60 MINUTES BEFORE BREAKFAST. Patient taking differently: Take 150 mcg by  mouth daily before breakfast. 09/29/20  Yes   topiramate (TOPAMAX) 50 MG tablet TAKE 1 TO 2 TABLETS BY MOUTH NIGHTLY AT BEDTIME Patient taking differently: Take 50-100 mg by mouth at bedtime. 10/24/20 10/24/21 Yes Sater, Pearletha Furl, MD  XIIDRA 5 % SOLN Place 1 drop into both eyes in the morning and at bedtime. 09/16/17  Yes [provider]    Allergies:  Scopolamine  Review of Systems: Negative except per HPI.  Physical Exam: Alert and oriented, NAD Head and neck: no masses, normal alignment CV: pulse intact Pulm: no increased work of breathing, respirations even and unlabored Abdomen: non-distended Extremities: extremities warm and well perfused  LABS: No results found for this or any previous visit (from the past 2160 hour(s)).   Complete History and Physical exam available in the office notes  Gomez Cleverly

## 2020-12-07 ENCOUNTER — Ambulatory Visit
Admission: RE | Admit: 2020-12-07 | Discharge: 2020-12-07 | Disposition: A | Discharge status: Home / Self Care | Payer: 59 | Source: Ambulatory Visit | Attending: Orthopedic Surgery | Admitting: Orthopedic Surgery

## 2020-12-07 ENCOUNTER — Other Ambulatory Visit: Payer: Self-pay

## 2020-12-07 ENCOUNTER — Encounter (HOSPITAL_BASED_OUTPATIENT_CLINIC_OR_DEPARTMENT_OTHER): Payer: Self-pay | Admitting: Orthopedic Surgery

## 2020-12-07 DIAGNOSIS — S62627A Displaced fracture of medial phalanx of left little finger, initial encounter for closed fracture: Secondary | ICD-10-CM | POA: Diagnosis not present

## 2020-12-07 DIAGNOSIS — Z01818 Encounter for other preprocedural examination: Secondary | ICD-10-CM | POA: Diagnosis not present

## 2020-12-07 NOTE — Anesthesia Preprocedure Evaluation (Addendum)
Anesthesia Evaluation  Patient identified by MRN, date of birth, ID band Patient awake    Reviewed: Allergy & Precautions, NPO status , Patient's Chart, lab work & pertinent test results  History of Anesthesia Complications (+) PONV and history of anesthetic complications  Airway Mallampati: II  TM Distance: >3 FB Neck ROM: Full    Dental  (+) Teeth Intact, Dental Advisory Given   Pulmonary asthma ,    Pulmonary exam normal breath sounds clear to auscultation       Cardiovascular negative cardio ROS Normal cardiovascular exam Rhythm:Regular Rate:Normal     Neuro/Psych  Headaches, PSYCHIATRIC DISORDERS Anxiety ADHDMS Essential tremor Gait disturbance due to MS  Neuromuscular disease    GI/Hepatic Neg liver ROS, hiatal hernia, GERD  Medicated and Controlled,  Endo/Other  diabetes, Well Controlled, Type 2Hypothyroidism Hyperthyroidism Hx/o Grave's disease S/P RAI 2019 now hypothyroid Obesity  Renal/GU negative Renal ROS  negative genitourinary   Musculoskeletal Left small finger middle phalanx Fx   Abdominal (+) + obese,   Peds  Hematology negative hematology ROS (+)   Anesthesia Other Findings   Reproductive/Obstetrics                            Anesthesia Physical Anesthesia Plan  ASA: 2  Anesthesia Plan: MAC   Post-op Pain Management:    Induction: Intravenous  PONV Risk Score and Plan: 4 or greater and Treatment may vary due to age or medical condition, Midazolam, Propofol infusion and Ondansetron  Airway Management Planned: Natural Airway, Simple Face Mask and Nasal Cannula  Additional Equipment:   Intra-op Plan:   Post-operative Plan:   Informed Consent: I have reviewed the patients History and Physical, chart, labs and discussed the procedure including the risks, benefits and alternatives for the proposed anesthesia with the patient or authorized representative who has  indicated his/her understanding and acceptance.     Dental advisory given  Plan Discussed with: CRNA and Anesthesiologist  Anesthesia Plan Comments:        Anesthesia Quick Evaluation

## 2020-12-08 ENCOUNTER — Encounter (HOSPITAL_BASED_OUTPATIENT_CLINIC_OR_DEPARTMENT_OTHER): Payer: Self-pay | Admitting: Orthopedic Surgery

## 2020-12-08 ENCOUNTER — Other Ambulatory Visit: Payer: Self-pay

## 2020-12-08 ENCOUNTER — Encounter (HOSPITAL_BASED_OUTPATIENT_CLINIC_OR_DEPARTMENT_OTHER)
Admission: RE | Disposition: A | Discharge status: Home / Self Care | Payer: Self-pay | Source: Home / Self Care | Attending: Orthopedic Surgery

## 2020-12-08 ENCOUNTER — Ambulatory Visit (HOSPITAL_BASED_OUTPATIENT_CLINIC_OR_DEPARTMENT_OTHER): Payer: 59 | Admitting: Anesthesiology

## 2020-12-08 ENCOUNTER — Ambulatory Visit (HOSPITAL_BASED_OUTPATIENT_CLINIC_OR_DEPARTMENT_OTHER)
Admission: RE | Admit: 2020-12-08 | Discharge: 2020-12-08 | Disposition: A | Discharge status: Home / Self Care | Payer: 59 | Attending: Orthopedic Surgery | Admitting: Orthopedic Surgery

## 2020-12-08 DIAGNOSIS — I872 Venous insufficiency (chronic) (peripheral): Secondary | ICD-10-CM | POA: Diagnosis not present

## 2020-12-08 DIAGNOSIS — Z888 Allergy status to other drugs, medicaments and biological substances status: Secondary | ICD-10-CM | POA: Insufficient documentation

## 2020-12-08 DIAGNOSIS — X58XXXA Exposure to other specified factors, initial encounter: Secondary | ICD-10-CM | POA: Insufficient documentation

## 2020-12-08 DIAGNOSIS — Z79899 Other long term (current) drug therapy: Secondary | ICD-10-CM | POA: Insufficient documentation

## 2020-12-08 DIAGNOSIS — J302 Other seasonal allergic rhinitis: Secondary | ICD-10-CM | POA: Diagnosis not present

## 2020-12-08 DIAGNOSIS — S62627A Displaced fracture of medial phalanx of left little finger, initial encounter for closed fracture: Secondary | ICD-10-CM | POA: Insufficient documentation

## 2020-12-08 DIAGNOSIS — E119 Type 2 diabetes mellitus without complications: Secondary | ICD-10-CM | POA: Insufficient documentation

## 2020-12-08 DIAGNOSIS — Z79890 Hormone replacement therapy (postmenopausal): Secondary | ICD-10-CM | POA: Diagnosis not present

## 2020-12-08 DIAGNOSIS — Z793 Long term (current) use of hormonal contraceptives: Secondary | ICD-10-CM | POA: Insufficient documentation

## 2020-12-08 DIAGNOSIS — G35 Multiple sclerosis: Secondary | ICD-10-CM | POA: Diagnosis not present

## 2020-12-08 DIAGNOSIS — Y939 Activity, unspecified: Secondary | ICD-10-CM | POA: Insufficient documentation

## 2020-12-08 DIAGNOSIS — Z6832 Body mass index (BMI) 32.0-32.9, adult: Secondary | ICD-10-CM | POA: Diagnosis not present

## 2020-12-08 DIAGNOSIS — E669 Obesity, unspecified: Secondary | ICD-10-CM | POA: Insufficient documentation

## 2020-12-08 DIAGNOSIS — F909 Attention-deficit hyperactivity disorder, unspecified type: Secondary | ICD-10-CM | POA: Diagnosis not present

## 2020-12-08 HISTORY — DX: Anxiety disorder, unspecified: F41.9

## 2020-12-08 HISTORY — DX: Attention-deficit hyperactivity disorder, unspecified type: F90.9

## 2020-12-08 HISTORY — DX: Personal history of other specified conditions: Z87.898

## 2020-12-08 HISTORY — DX: Essential tremor: G25.0

## 2020-12-08 HISTORY — PX: OPEN REDUCTION INTERNAL FIXATION (ORIF) PROXIMAL PHALANX: SHX6235

## 2020-12-08 HISTORY — DX: Unspecified abnormalities of gait and mobility: R26.9

## 2020-12-08 HISTORY — DX: Fracture of unspecified phalanx of unspecified finger, initial encounter for closed fracture: S62.609A

## 2020-12-08 HISTORY — DX: Presence of spectacles and contact lenses: Z97.3

## 2020-12-08 HISTORY — DX: Multiple sclerosis: G35

## 2020-12-08 HISTORY — DX: Mild intermittent asthma, uncomplicated: J45.20

## 2020-12-08 LAB — POCT PREGNANCY, URINE: Preg Test, Ur: NEGATIVE

## 2020-12-08 SURGERY — OPEN REDUCTION INTERNAL FIXATION (ORIF) PROXIMAL PHALANX
Anesthesia: Monitor Anesthesia Care | Laterality: Left

## 2020-12-08 MED ORDER — LABETALOL HCL 5 MG/ML IV SOLN
INTRAVENOUS | Status: AC
  Filled 2020-12-08: qty 4

## 2020-12-08 MED ORDER — LIDOCAINE 2% (20 MG/ML) 5 ML SYRINGE
INTRAMUSCULAR | Status: DC | PRN
  Administered 2020-12-08: 60 mg via INTRAVENOUS

## 2020-12-08 MED ORDER — PROPOFOL 500 MG/50ML IV EMUL
INTRAVENOUS | Status: DC | PRN
  Administered 2020-12-08: 100 ug/kg/min via INTRAVENOUS

## 2020-12-08 MED ORDER — LIDOCAINE 2% (20 MG/ML) 5 ML SYRINGE
INTRAMUSCULAR | Status: AC
  Filled 2020-12-08: qty 5

## 2020-12-08 MED ORDER — LIDOCAINE-EPINEPHRINE (PF) 1 %-1:200000 IJ SOLN
INTRAMUSCULAR | Status: DC | PRN
  Administered 2020-12-08: 10 mL via INTRAMUSCULAR

## 2020-12-08 MED ORDER — HYDROCODONE-ACETAMINOPHEN 5-325 MG PO TABS
1.0000 | ORAL_TABLET | Freq: Four times a day (QID) | ORAL | 0 refills | Status: DC | PRN
  Filled 2020-12-08: qty 15, 4d supply, fill #0

## 2020-12-08 MED ORDER — FENTANYL CITRATE (PF) 100 MCG/2ML IJ SOLN
INTRAMUSCULAR | Status: DC | PRN
  Administered 2020-12-08: 100 ug via INTRAVENOUS

## 2020-12-08 MED ORDER — FENTANYL CITRATE (PF) 100 MCG/2ML IJ SOLN
INTRAMUSCULAR | Status: AC
  Filled 2020-12-08: qty 2

## 2020-12-08 MED ORDER — LACTATED RINGERS IV SOLN: INTRAVENOUS | Status: DC

## 2020-12-08 MED ORDER — OXYCODONE HCL 5 MG/5ML PO SOLN: 5.0000 mg | Freq: Once | ORAL | Status: DC | PRN

## 2020-12-08 MED ORDER — OXYCODONE HCL 5 MG PO TABS
5.0000 mg | ORAL_TABLET | Freq: Once | ORAL | Status: DC | PRN
Start: 2020-12-08 — End: 2020-12-08

## 2020-12-08 MED ORDER — DEXMEDETOMIDINE (PRECEDEX) IN NS 20 MCG/5ML (4 MCG/ML) IV SYRINGE
PREFILLED_SYRINGE | INTRAVENOUS | Status: DC | PRN
  Administered 2020-12-08: 8 ug via INTRAVENOUS
  Administered 2020-12-08: 12 ug via INTRAVENOUS

## 2020-12-08 MED ORDER — MIDAZOLAM HCL 5 MG/5ML IJ SOLN
INTRAMUSCULAR | Status: DC | PRN
  Administered 2020-12-08: 2 mg via INTRAVENOUS

## 2020-12-08 MED ORDER — MIDAZOLAM HCL 2 MG/2ML IJ SOLN
INTRAMUSCULAR | Status: AC
  Filled 2020-12-08: qty 2

## 2020-12-08 MED ORDER — PROPOFOL 500 MG/50ML IV EMUL
INTRAVENOUS | Status: AC
  Filled 2020-12-08: qty 50

## 2020-12-08 MED ORDER — ONDANSETRON HCL 4 MG/2ML IJ SOLN: 4.0000 mg | Freq: Once | INTRAMUSCULAR | Status: DC | PRN

## 2020-12-08 MED ORDER — CEFAZOLIN SODIUM-DEXTROSE 2-4 GM/100ML-% IV SOLN
INTRAVENOUS | Status: AC
  Filled 2020-12-08: qty 100

## 2020-12-08 MED ORDER — PROPOFOL 10 MG/ML IV BOLUS
INTRAVENOUS | Status: DC | PRN
  Administered 2020-12-08: 30 mg via INTRAVENOUS
  Administered 2020-12-08 (×2): 20 mg via INTRAVENOUS

## 2020-12-08 MED ORDER — FENTANYL CITRATE (PF) 100 MCG/2ML IJ SOLN: 25.0000 ug | INTRAMUSCULAR | Status: DC | PRN

## 2020-12-08 MED ORDER — CEFAZOLIN SODIUM-DEXTROSE 2-4 GM/100ML-% IV SOLN
2.0000 g | INTRAVENOUS | Status: AC
  Administered 2020-12-08: 2 g via INTRAVENOUS

## 2020-12-08 SURGICAL SUPPLY — 45 items
1.0 DRILL BIT ×2 IMPLANT
APL SKNCLS STERI-STRIP NONHPOA (GAUZE/BANDAGES/DRESSINGS) ×1
BENZOIN TINCTURE PRP APPL 2/3 (GAUZE/BANDAGES/DRESSINGS) ×2 IMPLANT
BIT DRILL 1.0 W/MINI QC (BIT) ×2 IMPLANT
BLADE SURG 15 STRL LF DISP TIS (BLADE) ×1 IMPLANT
BLADE SURG 15 STRL SS (BLADE) ×2
BNDG CMPR 9X4 STRL LF SNTH (GAUZE/BANDAGES/DRESSINGS) ×1
BNDG ELASTIC 4X5.8 VLCR STR LF (GAUZE/BANDAGES/DRESSINGS) ×2 IMPLANT
BNDG ESMARK 4X9 LF (GAUZE/BANDAGES/DRESSINGS) ×2 IMPLANT
BNDG GAUZE ELAST 4 BULKY (GAUZE/BANDAGES/DRESSINGS) ×2 IMPLANT
BNDG PLASTER X FAST 3X3 WHT LF (CAST SUPPLIES) ×2 IMPLANT
BNDG PLSTR 9X3 FST ST WHT (CAST SUPPLIES) ×1
CORD BIPOLAR FORCEPS 12FT (ELECTRODE) ×2 IMPLANT
COVER BACK TABLE 60X90IN (DRAPES) ×2 IMPLANT
CUFF TOURN SGL QUICK 18X4 (TOURNIQUET CUFF) ×2 IMPLANT
DECANTER SPIKE VIAL GLASS SM (MISCELLANEOUS) IMPLANT
DRAPE EXTREMITY T 121X128X90 (DISPOSABLE) ×2 IMPLANT
DRAPE OEC MINIVIEW 54X84 (DRAPES) ×2 IMPLANT
DRAPE SHEET LG 3/4 BI-LAMINATE (DRAPES) ×2 IMPLANT
DRAPE SURG 17X23 STRL (DRAPES) ×2 IMPLANT
GAUZE 4X4 16PLY ~~LOC~~+RFID DBL (SPONGE) ×2 IMPLANT
GAUZE SPONGE 4X4 12PLY STRL (GAUZE/BANDAGES/DRESSINGS) ×2 IMPLANT
GAUZE SPONGE 4X4 12PLY STRL LF (GAUZE/BANDAGES/DRESSINGS) ×2 IMPLANT
GAUZE XEROFORM 1X8 LF (GAUZE/BANDAGES/DRESSINGS) ×2 IMPLANT
GOWN STRL REUS W/TWL LRG LVL3 (GOWN DISPOSABLE) ×2 IMPLANT
NEEDLE HYPO 25X1 1.5 SAFETY (NEEDLE) ×2 IMPLANT
NS IRRIG 1000ML POUR BTL (IV SOLUTION) IMPLANT
PACK BASIN DAY SURGERY FS (CUSTOM PROCEDURE TRAY) ×2 IMPLANT
PAD CAST CTTN 4X4 STRL (SOFTGOODS) ×1 IMPLANT
PADDING CAST ABS 4INX4YD NS (CAST SUPPLIES) ×1
PADDING CAST ABS COTTON 4X4 ST (CAST SUPPLIES) ×1 IMPLANT
PADDING CAST COTTON 4X4 STRL (SOFTGOODS) ×2
SCREW 1.3X10MM (Screw) ×2 IMPLANT
SCREW BN 10X1.3XNONLOCK HND (Screw) ×1 IMPLANT
SLEEVE SCD COMPRESS KNEE MED (STOCKING) ×2 IMPLANT
STRIP CLOSURE SKIN 1/2X4 (GAUZE/BANDAGES/DRESSINGS) ×2 IMPLANT
SUCTION FRAZIER HANDLE 10FR (MISCELLANEOUS) ×2
SUCTION TUBE FRAZIER 10FR DISP (MISCELLANEOUS) ×1 IMPLANT
SUT VIC AB 4-0 SH 27 (SUTURE) ×2
SUT VIC AB 4-0 SH 27XANBCTRL (SUTURE) ×1 IMPLANT
SYR 10ML LL (SYRINGE) ×2 IMPLANT
SYR BULB EAR ULCER 3OZ GRN STR (SYRINGE) IMPLANT
TOWEL OR 17X26 10 PK STRL BLUE (TOWEL DISPOSABLE) ×2 IMPLANT
TRAY DSU PREP LF (CUSTOM PROCEDURE TRAY) ×2 IMPLANT
UNDERPAD 30X36 HEAVY ABSORB (UNDERPADS AND DIAPERS) ×2 IMPLANT

## 2020-12-08 NOTE — Transfer of Care (Signed)
Immediate Anesthesia Transfer of Care Note  Patient: Carrie Nelson  Procedure(s) Performed: OPEN REDUCTION INTERNAL FIXATION (ORIF) PROXIMAL PHALANX (Left)  Patient Location: PACU  Anesthesia Type:MAC  Level of Consciousness: awake and alert   Airway & Oxygen Therapy: Patient Spontanous Breathing and Patient connected to face mask oxygen  Post-op Assessment: Report given to RN and Post -op Vital signs reviewed and stable  Post vital signs: Reviewed and stable  Last Vitals:  Vitals Value Taken Time  BP    Temp    Pulse 68 12/08/20 1055  Resp 12 12/08/20 1055  SpO2 98 % 12/08/20 1055  Vitals shown include unvalidated device data.  Last Pain:  Vitals:   12/08/20 0740  TempSrc: Oral  PainSc: 0-No pain      Patients Stated Pain Goal: 5 (38/32/91 9166)  Complications: No notable events documented.

## 2020-12-08 NOTE — Anesthesia Procedure Notes (Signed)
Procedure Name: MAC Date/Time: 12/08/2020 9:23 AM Performed by: Lieutenant Diego, CRNA Pre-anesthesia Checklist: Patient identified, Emergency Drugs available, Suction available, Patient being monitored and Timeout performed Patient Re-evaluated:Patient Re-evaluated prior to induction Oxygen Delivery Method: Simple face mask Preoxygenation: Pre-oxygenation with 100% oxygen Induction Type: IV induction

## 2020-12-08 NOTE — Interval H&P Note (Signed)
History and Physical Interval Note:  12/08/2020 8:53 AM  Carrie Nelson  has presented today for surgery, with the diagnosis of left small finger middle phalanx fracture.  The various methods of treatment have been discussed with the patient and family. After consideration of risks, benefits and other options for treatment, the patient has consented to  Procedure(s) with comments: OPEN REDUCTION INTERNAL FIXATION (ORIF) PROXIMAL PHALANX (Left) - with local as a surgical intervention.  The patient's history has been reviewed, patient examined, no change in status, stable for surgery.  I have reviewed the patient's chart and labs.  Questions were answered to the patient's satisfaction.     Gomez Cleverly

## 2020-12-08 NOTE — Discharge Instructions (Addendum)
Orthopaedic Hand Surgery Discharge Instructions  WEIGHT BEARING STATUS: Non weight bearing on operative extremity  DRESSINGS: Please keep your dressing/splint/cast clean and dry until your follow-up appointment. You may shower by placing a waterproof covering over your dressing/splint/cast. Contact your surgeon if your splint/cast gets wet. It will need to be changed to prevent skin breakdown.  PAIN CONTROL: First line medications for post operative pain control are Tylenol (acetaminophen) and Motrin (ibuprofen) if you are able to take these medications. If you have been prescribed a medication these can be taken as breakthrough pain medications. Please note that some narcotic pain medication have acetaminophen added and you should never consume more than 4,000mg  of acetaminophen in 24 hour period. Also please note that if you are given Toradol (ketoralac) you should not take similar medications simultaneously such as ibuprofen.   ICE/ELEVATION: Ice and elevate your injured extremity as needed. Avoid direct contact of ice with skin.  HOME MEDICATIONS: No changes have been made to your home medications.  FOLLOW UP: You will be called after surgery with an appointment date and time, however if you have not received a phone call within 3 days please call during regular office hours at [phone] to schedule a post operative appointment.  Please Seek Medical Attention if: Call MD for: pain or pressure in chest, jaw, arm, back, neck  Call MD for: temperature greater than 101 F for more than 24 hours  Call MD for: difficulty breathing Call MD for: Incision redness, bleeding, drainage  Call MD for: palpitations or feeling that the heart is racing  Call MD for: increased swelling in arm, leg, ankle, or abdomen  Call MD for: lightheadedness, dizziness, fainting Go to ED or call 911 if: chest pain does not go away after 3 nitroglycerin doses taken 5 min apart  Go to ED or call 911 for: any  uncontrolled bleeding  Go to ED or call 911 if: unable to reach physician  Discharge Medications: Allergies as of 12/08/2020       Reactions   Scopolamine Other (See Comments)   Per pt caused vision problem        Medication List     TAKE these medications    ALPRAZolam 0.5 MG tablet Commonly known as: XANAX TAKE 1 TABLET BY MOUTH NIGHTLY AS NEEDED FOR SLEEP   Concerta 36 MG CR tablet Generic drug: methylphenidate TAKE 2 TABLETS BY MOUTH DAILY AS NEEDED What changed:  how much to take how to take this when to take this   HYDROcodone-acetaminophen 5-325 MG tablet Commonly known as: NORCO/VICODIN Take 1 tablet by mouth every 6 (six) hours as needed for moderate pain.   loratadine 10 MG tablet Commonly known as: CLARITIN Take 10 mg by mouth at bedtime.   Norethindrone Acetate-Ethinyl Estradiol 1.5-30 MG-MCG tablet Commonly known as: LOESTRIN TAKE 1 TABLET BY MOUTH DAILY.   saccharomyces boulardii 250 MG capsule Commonly known as: FLORASTOR Take 250 mg by mouth daily.   Synthroid 150 MCG tablet Generic drug: levothyroxine TAKE 1 TABLET (150 MCG TOTAL) BY MOUTH ONCE DAILY. TAKE ON AN EMPTY STOMACH WITH A GLASS OF WATER AT LEAST 30-60 MINUTES BEFORE BREAKFAST. What changed:  how much to take how to take this when to take this   topiramate 50 MG tablet Commonly known as: TOPAMAX TAKE 1 TO 2 TABLETS BY MOUTH NIGHTLY AT BEDTIME What changed:  how much to take how to take this when to take this   Xiidra 5 % Soln Generic drug: Lifitegrast  Place 1 drop into both eyes in the morning and at bedtime.          Philipp Ovens, MD Orthopaedic Hand Surgery        Post Anesthesia Home Care Instructions  Activity: Get plenty of rest for the remainder of the day. A responsible individual must stay with you for 24 hours following the procedure.  For the next 24 hours, DO NOT: -Drive a car -Advertising copywriter -Drink alcoholic beverages -Take any  medication unless instructed by your physician -Make any legal decisions or sign important papers.  Meals: Start with liquid foods such as gelatin or soup. Progress to regular foods as tolerated. Avoid greasy, spicy, heavy foods. If nausea and/or vomiting occur, drink only clear liquids until the nausea and/or vomiting subsides. Call your physician if vomiting continues.  Special Instructions/Symptoms: Your throat may feel dry or sore from the anesthesia or the breathing tube placed in your throat during surgery. If this causes discomfort, gargle with warm salt water. The discomfort should disappear within 24 hours.

## 2020-12-08 NOTE — Anesthesia Postprocedure Evaluation (Signed)
Anesthesia Post Note  Patient: Carrie Nelson  Procedure(s) Performed: OPEN REDUCTION INTERNAL FIXATION (ORIF) PROXIMAL PHALANX (Left)     Patient location during evaluation: PACU Anesthesia Type: MAC Level of consciousness: awake and alert and oriented Pain management: pain level controlled Vital Signs Assessment: post-procedure vital signs reviewed and stable Respiratory status: spontaneous breathing, nonlabored ventilation and respiratory function stable Cardiovascular status: stable and blood pressure returned to baseline Postop Assessment: no apparent nausea or vomiting Anesthetic complications: no   No notable events documented.  Last Vitals:  Vitals:   12/08/20 1100 12/08/20 1115  BP: 109/63 114/64  Pulse: 74 62  Resp: 11 11  Temp:  36.4 C  SpO2: 97% 100%    Last Pain:  Vitals:   12/08/20 1115  TempSrc:   PainSc: 0-No pain                 Ovida Delagarza A.

## 2020-12-09 ENCOUNTER — Encounter (HOSPITAL_BASED_OUTPATIENT_CLINIC_OR_DEPARTMENT_OTHER): Payer: Self-pay | Admitting: Orthopedic Surgery

## 2020-12-09 NOTE — Op Note (Signed)
OPERATIVE NOTE  DATE OF PROCEDURE: 12/08/2020  SURGEONS:  Primary: Spears, James, MD  PREOPERATIVE DIAGNOSIS: left small finger middle phalanx fracture  POSTOPERATIVE DIAGNOSIS: Same  NAME OF PROCEDURE:    Left small finger middle phalanx open reduction internal fixation  ANESTHESIA: Monitor Anesthesia Care + Local  SKIN PREPARATION: Hibiclens  ESTIMATED BLOOD LOSS: Minimal  IMPLANTS: 1.3 mm screw Biomet  INDICATIONS:  Carrie Nelson is a 39 y.o. female who has the above preoperative diagnosis. The patient has decided to proceed with surgical intervention.  Risks, benefits and alternatives of operative management were discussed including, but not limited to, risks of anesthesia complications, infection, pain, persistent symptoms, stiffness, need for future surgery.  The patient understands, agrees and elects to proceed with surgery.    DESCRIPTION OF PROCEDURE: The patient was met in the pre-operative area and their identity was verified.  The operative location and laterality was also verified and marked.  The patient was brought to the OR and was placed supine on the table.  After repeat patient identification with the operative team anesthesia was provided and the patient was prepped and draped in the usual sterile fashion.  A final timeout was performed verifying the correction patient, procedure, location and laterality.  The left small finger was opened via brunner incision volarly over PIP joint under tourniquet control. Hemostasis was obtained. The flexor tendon sheath between a2 and a4 was opened and reflected. The volar plate was released from the distal and radial and ulnar attachments to accessory collateral ligament. The PIP joint was reduced. The joint was then shotgunned open. The joint surface was disimpacted and held reduced with a pointed reduction clamp. From anterior to posterior a 1.3 mm screw was placed. The joint was then reduced and was stable with smooth gliding ROM. The  neurovascular bundles were mobilized prior to shotgun of join and remained patent at end of the case. The incision was closed with mattress sutures and tourniquet deflated. The finger turned pink with brisk cap refill. All counts were correct x2. The patient was brought to pacu in stable condition.    James Reid Spears, MD  

## 2020-12-13 DIAGNOSIS — S62627A Displaced fracture of medial phalanx of left little finger, initial encounter for closed fracture: Secondary | ICD-10-CM | POA: Diagnosis not present

## 2020-12-13 DIAGNOSIS — M25642 Stiffness of left hand, not elsewhere classified: Secondary | ICD-10-CM | POA: Diagnosis not present

## 2020-12-16 ENCOUNTER — Other Ambulatory Visit: Payer: Self-pay

## 2020-12-19 ENCOUNTER — Other Ambulatory Visit: Payer: Self-pay

## 2020-12-19 MED ORDER — ALPRAZOLAM 0.5 MG PO TABS
0.5000 mg | ORAL_TABLET | Freq: Every evening | ORAL | 1 refills | Status: DC | PRN
  Filled 2020-12-19: qty 90, 90d supply, fill #0
  Filled 2021-03-16: qty 90, 90d supply, fill #1

## 2020-12-27 DIAGNOSIS — M25642 Stiffness of left hand, not elsewhere classified: Secondary | ICD-10-CM | POA: Diagnosis not present

## 2021-01-03 DIAGNOSIS — M25642 Stiffness of left hand, not elsewhere classified: Secondary | ICD-10-CM | POA: Diagnosis not present

## 2021-01-04 ENCOUNTER — Other Ambulatory Visit: Payer: Self-pay

## 2021-01-10 DIAGNOSIS — M25642 Stiffness of left hand, not elsewhere classified: Secondary | ICD-10-CM | POA: Diagnosis not present

## 2021-01-10 DIAGNOSIS — S62627A Displaced fracture of medial phalanx of left little finger, initial encounter for closed fracture: Secondary | ICD-10-CM | POA: Diagnosis not present

## 2021-01-12 ENCOUNTER — Encounter: Payer: Self-pay | Admitting: *Deleted

## 2021-01-12 DIAGNOSIS — M25642 Stiffness of left hand, not elsewhere classified: Secondary | ICD-10-CM | POA: Diagnosis not present

## 2021-01-17 DIAGNOSIS — M25642 Stiffness of left hand, not elsewhere classified: Secondary | ICD-10-CM | POA: Diagnosis not present

## 2021-01-23 ENCOUNTER — Other Ambulatory Visit: Payer: Self-pay

## 2021-01-24 DIAGNOSIS — S62627D Displaced fracture of medial phalanx of left little finger, subsequent encounter for fracture with routine healing: Secondary | ICD-10-CM | POA: Diagnosis not present

## 2021-01-24 DIAGNOSIS — M25642 Stiffness of left hand, not elsewhere classified: Secondary | ICD-10-CM | POA: Diagnosis not present

## 2021-01-26 ENCOUNTER — Other Ambulatory Visit: Payer: Self-pay

## 2021-01-26 ENCOUNTER — Other Ambulatory Visit: Payer: Self-pay | Admitting: Neurology

## 2021-01-26 MED ORDER — TOPIRAMATE 50 MG PO TABS
ORAL_TABLET | ORAL | 0 refills | Status: DC
  Filled 2021-01-26: qty 180, 90d supply, fill #0

## 2021-01-30 DIAGNOSIS — M25642 Stiffness of left hand, not elsewhere classified: Secondary | ICD-10-CM | POA: Diagnosis not present

## 2021-01-31 DIAGNOSIS — M9901 Segmental and somatic dysfunction of cervical region: Secondary | ICD-10-CM | POA: Diagnosis not present

## 2021-01-31 DIAGNOSIS — M9903 Segmental and somatic dysfunction of lumbar region: Secondary | ICD-10-CM | POA: Diagnosis not present

## 2021-01-31 DIAGNOSIS — R519 Headache, unspecified: Secondary | ICD-10-CM | POA: Diagnosis not present

## 2021-01-31 DIAGNOSIS — M5416 Radiculopathy, lumbar region: Secondary | ICD-10-CM | POA: Diagnosis not present

## 2021-02-03 DIAGNOSIS — M25642 Stiffness of left hand, not elsewhere classified: Secondary | ICD-10-CM | POA: Diagnosis not present

## 2021-02-06 DIAGNOSIS — M25642 Stiffness of left hand, not elsewhere classified: Secondary | ICD-10-CM | POA: Diagnosis not present

## 2021-02-07 DIAGNOSIS — M25562 Pain in left knee: Secondary | ICD-10-CM | POA: Diagnosis not present

## 2021-02-09 DIAGNOSIS — M25642 Stiffness of left hand, not elsewhere classified: Secondary | ICD-10-CM | POA: Diagnosis not present

## 2021-02-22 DIAGNOSIS — M25642 Stiffness of left hand, not elsewhere classified: Secondary | ICD-10-CM | POA: Diagnosis not present

## 2021-02-24 DIAGNOSIS — S62627A Displaced fracture of medial phalanx of left little finger, initial encounter for closed fracture: Secondary | ICD-10-CM | POA: Diagnosis not present

## 2021-02-28 DIAGNOSIS — M9903 Segmental and somatic dysfunction of lumbar region: Secondary | ICD-10-CM | POA: Diagnosis not present

## 2021-02-28 DIAGNOSIS — M5416 Radiculopathy, lumbar region: Secondary | ICD-10-CM | POA: Diagnosis not present

## 2021-02-28 DIAGNOSIS — R519 Headache, unspecified: Secondary | ICD-10-CM | POA: Diagnosis not present

## 2021-02-28 DIAGNOSIS — M9901 Segmental and somatic dysfunction of cervical region: Secondary | ICD-10-CM | POA: Diagnosis not present

## 2021-02-28 DIAGNOSIS — M25642 Stiffness of left hand, not elsewhere classified: Secondary | ICD-10-CM | POA: Diagnosis not present

## 2021-03-01 ENCOUNTER — Other Ambulatory Visit: Payer: Self-pay

## 2021-03-01 MED ORDER — FLUOXETINE HCL 10 MG PO TABS
ORAL_TABLET | ORAL | 3 refills | Status: DC
  Filled 2021-03-01: qty 90, 90d supply, fill #0
  Filled 2021-05-31: qty 90, 90d supply, fill #1
  Filled 2021-08-31: qty 90, 90d supply, fill #2
  Filled 2021-11-30: qty 90, 90d supply, fill #3

## 2021-03-06 DIAGNOSIS — M25642 Stiffness of left hand, not elsewhere classified: Secondary | ICD-10-CM | POA: Diagnosis not present

## 2021-03-13 DIAGNOSIS — M25642 Stiffness of left hand, not elsewhere classified: Secondary | ICD-10-CM | POA: Diagnosis not present

## 2021-03-16 ENCOUNTER — Other Ambulatory Visit: Payer: Self-pay

## 2021-03-20 ENCOUNTER — Encounter: Payer: Self-pay | Admitting: Neurology

## 2021-03-20 ENCOUNTER — Ambulatory Visit: Payer: 59 | Admitting: Neurology

## 2021-03-20 VITALS — BP 126/84 | HR 107 | Ht 62.0 in | Wt 179.0 lb

## 2021-03-20 DIAGNOSIS — Z79899 Other long term (current) drug therapy: Secondary | ICD-10-CM | POA: Diagnosis not present

## 2021-03-20 DIAGNOSIS — F988 Other specified behavioral and emotional disorders with onset usually occurring in childhood and adolescence: Secondary | ICD-10-CM

## 2021-03-20 DIAGNOSIS — R269 Unspecified abnormalities of gait and mobility: Secondary | ICD-10-CM

## 2021-03-20 DIAGNOSIS — E0590 Thyrotoxicosis, unspecified without thyrotoxic crisis or storm: Secondary | ICD-10-CM | POA: Diagnosis not present

## 2021-03-20 DIAGNOSIS — R5383 Other fatigue: Secondary | ICD-10-CM | POA: Diagnosis not present

## 2021-03-20 DIAGNOSIS — G35 Multiple sclerosis: Secondary | ICD-10-CM | POA: Diagnosis not present

## 2021-03-20 DIAGNOSIS — G43009 Migraine without aura, not intractable, without status migrainosus: Secondary | ICD-10-CM | POA: Diagnosis not present

## 2021-03-20 NOTE — Progress Notes (Signed)
[facility]  PATIENT: Carrie Nelson DOB: [birth year]  REFERRING CLINICIAN: Ramonita Lab HISTORY FROM: Patient    REASON FOR VISIT: Multiple Sclerosis   HISTORICAL  CHIEF COMPLAINT:  Chief Complaint  Patient presents with   Follow-up    Rm 1, alone. Here for 6 month MS f/u, on [place]. Pt reports being stable, no new or worsening in sx.                                                f                                                                    HISTORY OF PRESENT ILLNESS:  [name] is a 41 y.o. woman who was diagnosed with relapsing remitting MS in 2007.    Update 03/20/2021: She feels her MS has been stable.    She denies any MS exacerbation or new symptom.  She did [place] in November 2017 and November 2018.    She has completed the 4 years of REMS.  Only issue has been some thyroid abnormalities      Gait is doing well.  She has no falls.   If she is tired, her right le seems heavy.    She has no weakness in the arms.  She will have some paresthesias on the left side of the face, this is worse in hot weather  She has had fewer UTIs on Rephresh probiotics. .   She has seen urology and no issues were found.    She feels she does not empty but does not have hesitation.   She is taking a probiotic and feels she may be doing better this month.  She is sleeping well on xanax (low dose prescribed by PCP).   She has some MS related ADD and Concerta has helped.(Takes 36 mg most days and 72 mg occasional days.     Mood is doing well on fluoxetine.   Se tied stopping Fluoxetine ut feels better on it so resumed 10 mg.   She is going through divorce.  .   She has had weight fluctuations.  She has gained some weight (171 pound now was 144 pounds 12/2019).     Migraines have done very well on topiramate.  She had hyperthyroidism and she had radioactive iodine ablation.   She has hyothyroidism now and is on synthroid.          MS History:    She was  diagnosed in 2007 but,  in retrospect, had right foot dragging that started around 2005. Then shortly after that she had an episode of left facial numbness. In 2007 she had an episode of slurred speech and worsening clumsiness.  MRI of the brain that was consistent with multiple sclerosis. She also had a lumbar puncture and her CSF compatible with the diagnosis of MS. Most of her symptoms improved over the next few months but she did not get 100% back to baseline and she continues to have minimal residual weakness, clumsiness and numbness. She was initially placed on Betaseron but switched to  Tysabri because of some generalized side effects of Betaseron. She was seeing Dr. Dellis Filbert 2007 until 2014.   She was on Tysabri between 2008 and late 2014 when she stopped because of a desire to get pregnant. She became pregnant in early 2015 and delivered in November 2015. She had not been on any disease modifying therapy since late 2014 and then started Tecfidera 02/2014.Marland Kitchen   She did not tolerate it well and started [facility] 12/2015.    Imaging: MRI of the brain 11/02/2015 shows T2/FLAIR hyperintense foci in the pons and the periventricular, juxtacortical and deep white matter of both hemispheres in a pattern and configuration consistent with chronic demyelinating plaque associated with multiple sclerosis. None of the foci enhanced after contrast administration. Compared to the MRI dated 07/23/2014, there is no interval change.  MRI of the cervical spine 01/29/2013 showed a normal spinal cord and no significant degenerative changes.  MRI 01/13/2020 showed multiple T2/FLAIR hyperintense foci in the hemispheres, left thalamus and left pons in a pattern and configuration consistent with chronic demyelinating plaque associated with multiple sclerosis.  None of the foci enhance or appear to be acute.  Compared to the MRI from 07/23/2014, there are no new lesions.  REVIEW OF SYSTEMS:  Constitutional: No fevers, chills, sweats,  or change in appetite Eyes: No visual changes, double vision, eye pain Ear, nose and throat: No hearing loss, ear pain, nasal congestion, sore throat Cardiovascular: No chest pain, palpitations Respiratory:  No shortness of breath at rest or with exertion.   No wheezes GastrointestinaI: No nausea, vomiting, diarrhea, abdominal pain, fecal incontinence Genitourinary:  No dysuria, urinary retention or frequency.  No nocturia. Musculoskeletal:  No neck pain, back pain Integumentary: No rash, pruritus, skin lesions Neurological: as above Psychiatric: No depression at this time.  No anxiety.   Occasionally she has irritability. Endocrine: She she is borderline diabetic. No palpitations, diaphoresis, change in appetite, change in weigh or increased thirst Hematologic/Lymphatic:  No anemia, purpura, petechiae. Allergic/Immunologic: She has seasonal allergies and is treated with several medications.  ALLERGIES: Allergies  Allergen Reactions   Scopolamine Other (See Comments)    Per pt caused vision problem    HOME MEDICATIONS: Outpatient Medications Prior to Visit  Medication Sig Dispense Refill   ALPRAZolam (XANAX) 0.5 MG tablet TAKE 1 TABLET BY MOUTH NIGHTLY AS NEEDED FOR SLEEP 90 tablet 1   FLUoxetine (PROZAC) 10 MG tablet Take 1 tablet by mouth once daily 90 tablet 3   loratadine (CLARITIN) 10 MG tablet Take 10 mg by mouth at bedtime.     methylphenidate 36 MG PO CR tablet TAKE 2 TABLETS BY MOUTH DAILY AS NEEDED (Patient taking differently: Take 36 mg by mouth daily.) 180 tablet 0   Norethindrone Acetate-Ethinyl Estradiol (LOESTRIN) 1.5-30 MG-MCG tablet TAKE 1 TABLET BY MOUTH DAILY. (Patient taking differently: Take 1 tablet by mouth daily.) 63 tablet 5   saccharomyces boulardii (FLORASTOR) 250 MG capsule Take 250 mg by mouth daily.     SYNTHROID 150 MCG tablet TAKE 1 TABLET (150 MCG TOTAL) BY MOUTH ONCE DAILY. TAKE ON AN EMPTY STOMACH WITH A GLASS OF WATER AT LEAST 30-60 MINUTES BEFORE  BREAKFAST. (Patient taking differently: Take 150 mcg by mouth daily before breakfast.) 90 tablet 1   topiramate (TOPAMAX) 50 MG tablet TAKE 1 TO 2 TABLETS BY MOUTH NIGHTLY AT BEDTIME 180 tablet 0   XIIDRA 5 % SOLN Place 1 drop into both eyes in the morning and at bedtime.  6   HYDROcodone-acetaminophen (NORCO/VICODIN)  5-325 MG tablet Take 1 tablet by mouth every 6 (six) hours as needed for moderate pain. 15 tablet 0   No facility-administered medications prior to visit.    PAST MEDICAL HISTORY: Past Medical History:  Diagnosis Date   ADHD (attention deficit hyperactivity disorder)    Allergic asthma, mild intermittent, uncomplicated    per pt no rescue inhaler because she is under control (foilowed by pcp)   Anxiety    Complication of anesthesia    Finger fracture, left    5th phalanx   Gait disturbance    due to MS   GERD (gastroesophageal reflux disease)    History of Graves' disease 2017   s/p RAI 03/ 2019;  hyperthyroidism attributed to [place] IV infusion   History of repair of hiatal hernia 12/18/2019   History of syncope    hx recurrent , 2004 normal MRI and echo,  per pt no syncope since 2004   Hypothyroidism, 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 Gabriel Carina, per lov note in epic 06-12-2019 released to pcp   Migraines    Multiple sclerosis, relapsing-remitting [facility])    neurologist--- dr r. Lakysha Kossman;  dx 2007   PONV (postoperative nausea and vomiting)    Tremor, essential    Wears glasses     PAST SURGICAL HISTORY: Past Surgical History:  Procedure Laterality Date   77 HOUR [place] STUDY N/A 06/24/2019   Procedure: 24 HOUR PH STUDY;  Surgeon: Mauri Pole, MD;  [facility]: [facility];  Service: Endoscopy;  Laterality: N/A;   BUNIONECTOMY Left 2005   CESAREAN SECTION  01/22/2014   ESOPHAGEAL MANOMETRY N/A 06/24/2019   Procedure: ESOPHAGEAL MANOMETRY (EM);  Surgeon: Mauri Pole, MD;  [facility];  Service: Endoscopy;  Laterality: N/A;    ESOPHAGOGASTRODUODENOSCOPY N/A 12/18/2019   Procedure: ESOPHAGOGASTRODUODENOSCOPY (EGD);  Surgeon: Lavena Bullion, DO;  [facility];  Service: Gastroenterology;  Laterality: N/A;   HIATAL HERNIA REPAIR N/A 12/18/2019   Procedure: LAPAROSCOPIC REPAIR OF HIATAL HERNIA;  Surgeon: Greer Pickerel, MD;  [facility];  Service: General;  Laterality: N/A;   KNEE ARTHROSCOPY W/ ACL RECONSTRUCTION Left 03/2008   @[facility]    OPEN REDUCTION INTERNAL FIXATION (ORIF) PROXIMAL PHALANX Left 12/08/2020   Procedure: OPEN REDUCTION INTERNAL FIXATION (ORIF) PROXIMAL PHALANX;  Surgeon: Orene Desanctis, MD;  Location: [place];  Service: Orthopedics;  Laterality: Left;  with local   [place] IMPEDANCE STUDY N/A 06/24/2019   Procedure: [place];  Surgeon: Mauri Pole, MD;  [facility];  Service: Endoscopy;  Laterality: N/A;   TRANSORAL INCISIONLESS FUNDOPLICATION N/A [id number]   Procedure: TRANSORAL INCISIONLESS FUNDOPLICATION;  Surgeon: Lavena Bullion, DO;  [facility];  Service: Gastroenterology;  Laterality: N/A;   UPPER GASTROINTESTINAL ENDOSCOPY  05/07/2019    FAMILY HISTORY: Family History  Problem Relation Age of Onset   Prostate cancer Father    Diabetes Father    Arthritis Other    Dementia Other    Hypertension Other    Heart disease Other    Kidney disease Maternal Grandmother    Esophageal cancer Maternal Grandfather    Heart disease Paternal Grandfather    Colon cancer Neg Hx    Rectal cancer Neg Hx    Stomach cancer Neg Hx     SOCIAL HISTORY:  Social History   Socioeconomic History   Marital status: Married    Spouse name: Not on file   Number of children: 1   Years of education: Not  on file   Highest education level: Not on file  Occupational History   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   Patient is single.    Patient has a Haematologist.    Social Determinants of Health   Financial Resource Strain: Not on file  Food Insecurity: Not on file  Transportation Needs: Not on file  Physical Activity: Not on file  Stress: Not on file  Social Connections: Not on file  Intimate Partner Violence: Not on file     PHYSICAL EXAM  Vitals:   03/20/21 1104  BP: 126/84  Pulse: (!) 107  SpO2: 99%  Weight: 179 lb (81.2 kg)  Height: 5\' 2"  (1.575 m)     Body mass index is 32.74 kg/m.   General: The patient is well-developed and well-nourished and in no acute distress   Neurologic Exam  Mental status: The patient is alert and oriented x 3 at the time of the examination. The patient has apparent normal recent and remote memory, with an apparently normal attention span and concentration ability.   Speech is normal.  Cranial nerves:   Extraocular muscles are intact.  Facial strength and sensation is normal.  Trapezius strength is normal.  Hearing is normal symmetric  Motor:  Muscle bulk and tone are normal. Strength is  5 / 5 in all 4 extremities.   Sensory: She has intact sensation to touch and vibration  Coordination: Finger-nose-finger and heel-to-shin is performed well.  Gait and station: Station and gait are normal.  Tandem gait is slightly wide.  Romberg is negative.  Reflexes: Deep tendon reflexes are symmetric and normal in arms. Mildly increased right knee DTR.         ASSESSMENT AND PLAN  Multiple sclerosis (HCC)  High risk medication use  Gait disturbance  Attention deficit disorder, unspecified hyperactivity presence  Hyperthyroidism  Common migraine without  intractability  Other fatigue    1.  She has now completed the [place] rems monitoring program.  We still need to do periodic imaging to ensure that she is not having breakthrough activity.  Check an MRI of the brain around tie of next visit to determine if there is any subclinical progression.  We discussed strategies if she has a relapse or significant change on MRI. 2.  Continue Concerta for fatigue and MS related ADD. 3.  Topamax 50-100 mg nightly. 4.  Stay active and try to eat healthy to help with the weight loss.   5.  Return to see me in 6 months or sooner if there are new or worsening neurologic symptoms.  Suzanne Kho A. Felecia Shelling, MD, PhD [id number], [id number] AM Certified in Neurology, Clinical Neurophysiology, Sleep Medicine, Pain Medicine and Neuroimaging  [facility] [address] [place], [place] [address] [phone]-

## 2021-03-27 DIAGNOSIS — M25642 Stiffness of left hand, not elsewhere classified: Secondary | ICD-10-CM | POA: Diagnosis not present

## 2021-03-29 DIAGNOSIS — R519 Headache, unspecified: Secondary | ICD-10-CM | POA: Diagnosis not present

## 2021-03-29 DIAGNOSIS — M9903 Segmental and somatic dysfunction of lumbar region: Secondary | ICD-10-CM | POA: Diagnosis not present

## 2021-03-29 DIAGNOSIS — M5416 Radiculopathy, lumbar region: Secondary | ICD-10-CM | POA: Diagnosis not present

## 2021-03-29 DIAGNOSIS — M9901 Segmental and somatic dysfunction of cervical region: Secondary | ICD-10-CM | POA: Diagnosis not present

## 2021-04-03 ENCOUNTER — Other Ambulatory Visit: Payer: Self-pay

## 2021-04-03 DIAGNOSIS — M25642 Stiffness of left hand, not elsewhere classified: Secondary | ICD-10-CM | POA: Diagnosis not present

## 2021-04-04 ENCOUNTER — Other Ambulatory Visit: Payer: Self-pay

## 2021-04-04 MED ORDER — SYNTHROID 150 MCG PO TABS
ORAL_TABLET | ORAL | 1 refills | Status: DC
  Filled 2021-04-04: qty 90, 90d supply, fill #0
  Filled 2021-08-22: qty 90, 90d supply, fill #1

## 2021-04-10 ENCOUNTER — Other Ambulatory Visit: Payer: Self-pay

## 2021-04-10 DIAGNOSIS — M25642 Stiffness of left hand, not elsewhere classified: Secondary | ICD-10-CM | POA: Diagnosis not present

## 2021-04-17 DIAGNOSIS — M25642 Stiffness of left hand, not elsewhere classified: Secondary | ICD-10-CM | POA: Diagnosis not present

## 2021-04-20 ENCOUNTER — Other Ambulatory Visit: Payer: Self-pay

## 2021-04-20 ENCOUNTER — Telehealth: Payer: Self-pay | Admitting: Emergency Medicine

## 2021-04-20 ENCOUNTER — Ambulatory Visit
Admission: EM | Admit: 2021-04-20 | Discharge: 2021-04-20 | Disposition: A | Discharge status: Home / Self Care | Payer: 59 | Attending: Emergency Medicine | Admitting: Emergency Medicine

## 2021-04-20 ENCOUNTER — Encounter: Payer: Self-pay | Admitting: Emergency Medicine

## 2021-04-20 DIAGNOSIS — N3000 Acute cystitis without hematuria: Secondary | ICD-10-CM

## 2021-04-20 DIAGNOSIS — J029 Acute pharyngitis, unspecified: Secondary | ICD-10-CM | POA: Diagnosis not present

## 2021-04-20 DIAGNOSIS — R300 Dysuria: Secondary | ICD-10-CM | POA: Diagnosis not present

## 2021-04-20 LAB — POCT URINALYSIS DIP (MANUAL ENTRY)
Blood, UA: NEGATIVE
Glucose, UA: NEGATIVE mg/dL
Nitrite, UA: NEGATIVE
Protein Ur, POC: 30 mg/dL — AB
Spec Grav, UA: >=1.03 — AB (ref 1.010–1.025)
Urobilinogen, UA: 1 E.U./dL
pH, UA: 5.5 (ref 5.0–8.0)

## 2021-04-20 LAB — POCT RAPID STREP A (OFFICE): Rapid Strep A Screen: NEGATIVE

## 2021-04-20 MED ORDER — CEPHALEXIN 500 MG PO CAPS
500.0000 mg | ORAL_CAPSULE | Freq: Four times a day (QID) | ORAL | 0 refills | Status: AC
  Filled 2021-04-20: qty 28, 7d supply, fill #0

## 2021-04-20 NOTE — Discharge Instructions (Signed)
Take Keflex 4 times daily for the next 7 days. You can continue taking Claritin or 10 mg of Zyrtec once daily. Please take 1 spray of Flonase each side to help with eustachian tube dysfunction.

## 2021-04-20 NOTE — ED Triage Notes (Signed)
Pt here with sore throat, left otalgia, painful urination and suprapubic abdominal pain x 5 days. Denies fevers. Son has strep throat.

## 2021-04-20 NOTE — Telephone Encounter (Cosign Needed)
Patient needs script re-sent

## 2021-04-20 NOTE — ED Provider Notes (Signed)
[facility]  ____________________________________________  Time seen: Approximately 11:22 AM  I have reviewed the triage vital signs and the nursing notes.   HISTORY  Chief Complaint Sore Throat, Otalgia, Dysuria, and Abdominal Pain   Historian Patient     HPI Carrie Nelson is a 41 y.o. female presents to the urgent care with pharyngitis, left-sided otalgia and dysuria.  Patient states that has been least for 5 years since she has had pyelonephritis.  She does report that her son recently had strep throat approximately a week ago.  No associated rhinorrhea, nasal congestion or nonproductive cough.  No recent unprotected sex or deep dyspareunia.   Past Medical History:  Diagnosis Date   ADHD (attention deficit hyperactivity disorder)    Allergic asthma, mild intermittent, uncomplicated    per pt no rescue inhaler because she is under control (foilowed by pcp)   Anxiety    Complication of anesthesia    Finger fracture, left    5th phalanx   Gait disturbance    due to MS   GERD (gastroesophageal reflux disease)    History of Graves' disease 2017   s/p RAI 03/ 2019;  hyperthyroidism attributed to [place] IV infusion   History of repair of hiatal hernia 12/18/2019   History of syncope    hx recurrent , 2004 normal MRI and echo,  per pt no syncope since 2004   Hypothyroidism, 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 Gabriel Carina, per lov note in epic 06-12-2019 released to pcp   Migraines    Multiple sclerosis, relapsing-remitting [facility])    neurologist--- dr Alfonso Patten. sater;  dx 2007   PONV (postoperative nausea and vomiting)    Tremor, essential    Wears glasses      Immunizations up to date:  Yes.     Past Medical History:  Diagnosis Date   ADHD (attention deficit hyperactivity disorder)    Allergic asthma, mild intermittent, uncomplicated    per pt no rescue inhaler because she is under control (foilowed by pcp)   Anxiety    Complication of anesthesia     Finger fracture, left    5th phalanx   Gait disturbance    due to MS   GERD (gastroesophageal reflux disease)    History of Graves' disease 2017   s/p RAI 03/ 2019;  hyperthyroidism attributed to [place] IV infusion   History of repair of hiatal hernia 12/18/2019   History of syncope    hx recurrent , 2004 normal MRI and echo,  per pt no syncope since 2004   Hypothyroidism, 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 Gabriel Carina, per lov note in epic 06-12-2019 released to pcp   Migraines    Multiple sclerosis, relapsing-remitting [facility])    neurologist--- dr r. 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   Pain in limb 04/02/2016   Type 2 diabetes mellitus ([place]) 04/02/2016   High risk medication use 10/18/2015   Constipation, chronic 08/18/2015   Asthma, exogenous 01/31/2015   Anxiety 01/31/2015   History of knee problem 01/31/2015  H/O thyroid disease 01/31/2015   Allergic rhinitis, seasonal 01/31/2015   Gait disturbance 07/08/2014   Other fatigue 07/08/2014   Attention deficit disorder 07/08/2014   Nonspecific abnormal results of other specified function study 09/06/2012   Dysarthria 09/06/2012   Knee Pain, Left  09/06/2012   Multiple sclerosis ([place]) 09/06/2012   Female genuine stress incontinence 11/06/2011   FOM (frequency of micturition) 11/06/2011   Chronic cystitis 11/06/2011   Gross hematuria 11/06/2011    Past Surgical History:  Procedure Laterality Date   85 HOUR [place] STUDY N/A 06/24/2019   Procedure: 24 HOUR PH STUDY;  Surgeon: Mauri Pole, MD;   [facility];  Service: Endoscopy;  Laterality: N/A;   BUNIONECTOMY Left 2005   CESAREAN SECTION  01/22/2014   ESOPHAGEAL MANOMETRY N/A 06/24/2019   Procedure: ESOPHAGEAL MANOMETRY (EM);  Surgeon: Mauri Pole, MD;  [facility];  Service: Endoscopy;  Laterality: N/A;   ESOPHAGOGASTRODUODENOSCOPY N/A 12/18/2019   Procedure: ESOPHAGOGASTRODUODENOSCOPY (EGD);  Surgeon: Lavena Bullion, DO;  [facility];  Service: Gastroenterology;  Laterality: N/A;   HIATAL HERNIA REPAIR N/A 12/18/2019   Procedure: LAPAROSCOPIC REPAIR OF HIATAL HERNIA;  Surgeon: Greer Pickerel, MD;  [facility];  Service: General;  Laterality: N/A;   KNEE ARTHROSCOPY W/ ACL RECONSTRUCTION Left 03/2008   @[facility]    OPEN REDUCTION INTERNAL FIXATION (ORIF) PROXIMAL PHALANX Left 12/08/2020   Procedure: OPEN REDUCTION INTERNAL FIXATION (ORIF) PROXIMAL PHALANX;  Surgeon: Orene Desanctis, MD;  Location: [place];  Service: Orthopedics;  Laterality: Left;  with local   [place] IMPEDANCE STUDY N/A 06/24/2019   Procedure: [place];  Surgeon: Mauri Pole, MD;  [facility];  Service: Endoscopy;  Laterality: N/A;   TRANSORAL INCISIONLESS FUNDOPLICATION N/A [id number]   Procedure: TRANSORAL INCISIONLESS FUNDOPLICATION;  Surgeon: Lavena Bullion, DO;  [facility];  Service: Gastroenterology;  Laterality: N/A;   UPPER GASTROINTESTINAL ENDOSCOPY  05/07/2019    Prior to Admission medications   Medication Sig Start Date End Date Taking? Authorizing Provider  ALPRAZolam (XANAX) 0.5 MG tablet TAKE 1 TABLET BY MOUTH NIGHTLY AS NEEDED FOR SLEEP 12/18/20     FLUoxetine (PROZAC) 10 MG tablet Take 1 tablet by mouth once daily 03/01/21     loratadine (CLARITIN) 10 MG tablet Take 10 mg by mouth at bedtime.    [provider]  methylphenidate 36 MG PO CR tablet TAKE 2 TABLETS BY MOUTH DAILY AS NEEDED Patient taking differently: Take 36 mg by mouth daily. 11/08/20  05/07/21  Sater, Nanine Means, MD  Norethindrone Acetate-Ethinyl Estradiol (LOESTRIN) 1.5-30 MG-MCG tablet TAKE 1 TABLET BY MOUTH DAILY. Patient taking differently: Take 1 tablet by mouth daily. 11/24/20 11/24/21  Harlin Heys, MD  saccharomyces boulardii (FLORASTOR) 250 MG capsule Take 250 mg by mouth daily.    [provider]  SYNTHROID 150 MCG tablet TAKE 1 TABLET (150 MCG TOTAL) BY MOUTH ONCE DAILY. TAKE ON AN EMPTY STOMACH WITH A GLASS OF WATER AT LEAST 30-60 MINUTES BEFORE BREAKFAST. 04/03/21     topiramate (TOPAMAX) 50 MG tablet TAKE 1 TO 2 TABLETS BY MOUTH NIGHTLY AT BEDTIME 01/26/21 01/26/22  Sater, Nanine Means, MD  XIIDRA 5 % SOLN Place 1 drop into both eyes in the morning and at bedtime. 09/16/17   [provider]    Allergies Scopolamine  Family History  Problem Relation Age of Onset   Prostate cancer Father    Diabetes Father    Arthritis Other  Dementia Other    Hypertension Other    Heart disease Other    Kidney disease Maternal Grandmother    Esophageal cancer Maternal Grandfather    Heart disease Paternal Grandfather    Colon cancer Neg Hx    Rectal cancer Neg Hx    Stomach cancer Neg Hx     Social History Social History   Tobacco Use   Smoking status: Never   Smokeless tobacco: Never  Vaping Use   Vaping Use: Never used  Substance Use Topics   Alcohol use: Not Currently   Drug use: Never     Review of Systems  Constitutional: No fever/chills Eyes:  No discharge ENT: Patient has pharyngitis.  Patient has left-sided ear discomfort. Respiratory: no cough. No SOB/ use of accessory muscles to breath Gastrointestinal:   No nausea, no vomiting.  No diarrhea.  No constipation. Genitourinary: Patient has dysuria  Musculoskeletal: Negative for musculoskeletal pain. Skin: Negative for rash, abrasions, lacerations, ecchymosis.    ____________________________________________   PHYSICAL EXAM:  VITAL SIGNS: ED Triage Vitals  Enc Vitals  Group     BP 04/20/21 1111 128/72     Pulse Rate 04/20/21 1111 92     Resp 04/20/21 1111 20     Temp 04/20/21 1111 98.8 F (37.1 C)     Temp src --      SpO2 04/20/21 1111 98 %     Weight --      Height --      Head Circumference --      Peak Flow --      Pain Score 04/20/21 1115 4     Pain Loc --      Pain Edu? --      Excl. in GC? --      Constitutional: Alert and oriented. Well appearing and in no acute distress. Eyes: Conjunctivae are normal. PERRL. EOMI. Head: Atraumatic. ENT: Cardiovascular: Normal rate, regular rhythm. Normal S1 and S2.  Good peripheral circulation. Respiratory: Normal respiratory effort without tachypnea or retractions. Lungs CTAB. Good air entry to the bases with no decreased or absent breath sounds Gastrointestinal: Bowel sounds x 4 quadrants. Soft and nontender to palpation. No guarding or rigidity. No distention. Musculoskeletal: Full range of motion to all extremities. No obvious deformities noted Neurologic:  Normal for age. No gross focal neurologic deficits are appreciated.  Skin:  Skin is warm, dry and intact. No rash noted. Psychiatric: Mood and affect are normal for age. Speech and behavior are normal.   ____________________________________________   LABS (all labs ordered are listed, but only abnormal results are displayed)  Labs Reviewed  POCT URINALYSIS DIP (MANUAL ENTRY) - Abnormal; Notable for the following components:      Result Value   Clarity, UA cloudy (*)    Bilirubin, UA small (*)    Ketones, POC UA trace (5) (*)    Spec Grav, UA >=1.030 (*)    Protein Ur, POC =30 (*)    Leukocytes, UA Large (3+) (*)    All other components within normal limits  URINE CULTURE  POCT RAPID STREP A (OFFICE)   ____________________________________________  EKG   ____________________________________________  RADIOLOGY   No results found.  ____________________________________________    PROCEDURES  Procedure(s) performed:      Procedures     Medications - No data to display   ____________________________________________   INITIAL IMPRESSION / ASSESSMENT AND PLAN / ED COURSE  Pertinent labs & imaging results that were available during my care of  the patient were reviewed by me and considered in my medical decision making (see chart for details).    Assessment and plan Otalgia Dysuria Pharyngitis 41 year old female presents to the urgent care with multiple medical pulm complaints including left-sided otalgia, pharyngitis and dysuria.  Group A strep testing was negative.  Culture in process.  Left-sided ear exam consistent with middle ear effusion.  Urinalysis is concerning for UTI with large leuks.  Will treat with Keflex 4 times daily for the next 7 days.      ____________________________________________  FINAL CLINICAL IMPRESSION(S) / ED DIAGNOSES  Final diagnoses:  Acute cystitis without hematuria  Dysuria      NEW MEDICATIONS STARTED DURING THIS VISIT:  ED Discharge Orders     None           This chart was dictated using voice recognition software/Dragon. Despite best efforts to proofread, errors can occur which can change the meaning. Any change was purely unintentional.     Lannie Fields, PA-C 04/20/21 1125

## 2021-04-21 LAB — URINE CULTURE: Culture: <10000 — AB

## 2021-04-24 ENCOUNTER — Other Ambulatory Visit: Payer: Self-pay | Admitting: Neurology

## 2021-04-25 ENCOUNTER — Other Ambulatory Visit: Payer: Self-pay

## 2021-04-25 MED ORDER — TOPIRAMATE 50 MG PO TABS
ORAL_TABLET | ORAL | 1 refills | Status: DC
  Filled 2021-04-25: qty 180, 90d supply, fill #0
  Filled 2021-07-27: qty 180, 90d supply, fill #1

## 2021-05-01 DIAGNOSIS — Z0000 Encounter for general adult medical examination without abnormal findings: Secondary | ICD-10-CM | POA: Diagnosis not present

## 2021-05-01 DIAGNOSIS — G35 Multiple sclerosis: Secondary | ICD-10-CM | POA: Diagnosis not present

## 2021-05-01 DIAGNOSIS — E7849 Other hyperlipidemia: Secondary | ICD-10-CM | POA: Diagnosis not present

## 2021-05-01 DIAGNOSIS — Z8639 Personal history of other endocrine, nutritional and metabolic disease: Secondary | ICD-10-CM | POA: Diagnosis not present

## 2021-05-03 DIAGNOSIS — M9903 Segmental and somatic dysfunction of lumbar region: Secondary | ICD-10-CM | POA: Diagnosis not present

## 2021-05-03 DIAGNOSIS — M9901 Segmental and somatic dysfunction of cervical region: Secondary | ICD-10-CM | POA: Diagnosis not present

## 2021-05-03 DIAGNOSIS — M5416 Radiculopathy, lumbar region: Secondary | ICD-10-CM | POA: Diagnosis not present

## 2021-05-03 DIAGNOSIS — R519 Headache, unspecified: Secondary | ICD-10-CM | POA: Diagnosis not present

## 2021-05-04 ENCOUNTER — Other Ambulatory Visit: Payer: Self-pay

## 2021-05-04 DIAGNOSIS — E039 Hypothyroidism, unspecified: Secondary | ICD-10-CM | POA: Diagnosis not present

## 2021-05-04 DIAGNOSIS — J302 Other seasonal allergic rhinitis: Secondary | ICD-10-CM | POA: Diagnosis not present

## 2021-05-04 DIAGNOSIS — E7849 Other hyperlipidemia: Secondary | ICD-10-CM | POA: Diagnosis not present

## 2021-05-04 DIAGNOSIS — J4520 Mild intermittent asthma, uncomplicated: Secondary | ICD-10-CM | POA: Diagnosis not present

## 2021-05-04 MED ORDER — LEVOTHYROXINE SODIUM 175 MCG PO TABS
ORAL_TABLET | ORAL | 11 refills | Status: DC
  Filled 2021-05-04: qty 30, 30d supply, fill #0
  Filled 2021-05-31: qty 30, 30d supply, fill #1
  Filled 2021-07-05: qty 90, 90d supply, fill #2
  Filled 2021-09-15: qty 90, 90d supply, fill #3
  Filled 2022-01-04: qty 90, 90d supply, fill #4
  Filled 2022-04-04: qty 30, 30d supply, fill #5

## 2021-05-05 ENCOUNTER — Other Ambulatory Visit: Payer: Self-pay

## 2021-05-12 ENCOUNTER — Ambulatory Visit
Admission: RE | Admit: 2021-05-12 | Discharge: 2021-05-12 | Disposition: A | Discharge status: Home / Self Care | Payer: 59 | Source: Ambulatory Visit | Attending: Obstetrics and Gynecology | Admitting: Obstetrics and Gynecology

## 2021-05-12 ENCOUNTER — Other Ambulatory Visit: Payer: Self-pay | Admitting: Neurology

## 2021-05-12 ENCOUNTER — Other Ambulatory Visit: Payer: Self-pay

## 2021-05-12 DIAGNOSIS — Z01419 Encounter for gynecological examination (general) (routine) without abnormal findings: Secondary | ICD-10-CM | POA: Insufficient documentation

## 2021-05-12 DIAGNOSIS — Z1231 Encounter for screening mammogram for malignant neoplasm of breast: Secondary | ICD-10-CM | POA: Diagnosis not present

## 2021-05-15 ENCOUNTER — Other Ambulatory Visit: Payer: Self-pay | Admitting: Neurology

## 2021-05-15 ENCOUNTER — Other Ambulatory Visit: Payer: Self-pay

## 2021-05-15 NOTE — Telephone Encounter (Signed)
Last OV was on 03/20/21.  ?Next OV is scheduled for 09/20/21 .  ?Last RX was written on 11/09/20 for 180 tabs.  ? ?[place] [facility] Database has been reviewed.  ?

## 2021-05-16 MED FILL — Methylphenidate HCl Tab ER Osmotic Release (OSM) 36 MG: ORAL | 90 days supply | Qty: 180 | Fill #0 | Status: AC

## 2021-05-17 ENCOUNTER — Other Ambulatory Visit: Payer: Self-pay

## 2021-05-26 DIAGNOSIS — D75839 Thrombocytosis, unspecified: Secondary | ICD-10-CM | POA: Diagnosis not present

## 2021-05-26 DIAGNOSIS — E7849 Other hyperlipidemia: Secondary | ICD-10-CM | POA: Diagnosis not present

## 2021-05-26 DIAGNOSIS — E039 Hypothyroidism, unspecified: Secondary | ICD-10-CM | POA: Diagnosis not present

## 2021-05-31 ENCOUNTER — Other Ambulatory Visit: Payer: Self-pay

## 2021-05-31 DIAGNOSIS — R519 Headache, unspecified: Secondary | ICD-10-CM | POA: Diagnosis not present

## 2021-05-31 DIAGNOSIS — M9901 Segmental and somatic dysfunction of cervical region: Secondary | ICD-10-CM | POA: Diagnosis not present

## 2021-05-31 DIAGNOSIS — M9903 Segmental and somatic dysfunction of lumbar region: Secondary | ICD-10-CM | POA: Diagnosis not present

## 2021-05-31 DIAGNOSIS — M5416 Radiculopathy, lumbar region: Secondary | ICD-10-CM | POA: Diagnosis not present

## 2021-06-14 ENCOUNTER — Other Ambulatory Visit: Payer: Self-pay

## 2021-06-15 ENCOUNTER — Other Ambulatory Visit: Payer: Self-pay

## 2021-06-15 MED ORDER — ALPRAZOLAM 0.5 MG PO TABS
0.5000 mg | ORAL_TABLET | Freq: Every evening | ORAL | 1 refills | Status: DC | PRN
  Filled 2021-06-15: qty 90, 90d supply, fill #0
  Filled 2021-09-15: qty 90, 90d supply, fill #1

## 2021-06-22 DIAGNOSIS — H16142 Punctate keratitis, left eye: Secondary | ICD-10-CM | POA: Diagnosis not present

## 2021-06-29 DIAGNOSIS — M9901 Segmental and somatic dysfunction of cervical region: Secondary | ICD-10-CM | POA: Diagnosis not present

## 2021-06-29 DIAGNOSIS — R519 Headache, unspecified: Secondary | ICD-10-CM | POA: Diagnosis not present

## 2021-06-29 DIAGNOSIS — M5416 Radiculopathy, lumbar region: Secondary | ICD-10-CM | POA: Diagnosis not present

## 2021-06-29 DIAGNOSIS — M9903 Segmental and somatic dysfunction of lumbar region: Secondary | ICD-10-CM | POA: Diagnosis not present

## 2021-07-05 ENCOUNTER — Other Ambulatory Visit: Payer: Self-pay

## 2021-07-27 ENCOUNTER — Other Ambulatory Visit: Payer: Self-pay

## 2021-08-03 DIAGNOSIS — M5416 Radiculopathy, lumbar region: Secondary | ICD-10-CM | POA: Diagnosis not present

## 2021-08-03 DIAGNOSIS — M9903 Segmental and somatic dysfunction of lumbar region: Secondary | ICD-10-CM | POA: Diagnosis not present

## 2021-08-03 DIAGNOSIS — R519 Headache, unspecified: Secondary | ICD-10-CM | POA: Diagnosis not present

## 2021-08-03 DIAGNOSIS — M9901 Segmental and somatic dysfunction of cervical region: Secondary | ICD-10-CM | POA: Diagnosis not present

## 2021-08-22 ENCOUNTER — Other Ambulatory Visit: Payer: Self-pay | Admitting: Neurology

## 2021-08-22 ENCOUNTER — Other Ambulatory Visit: Payer: Self-pay

## 2021-08-23 ENCOUNTER — Other Ambulatory Visit: Payer: Self-pay

## 2021-08-23 MED FILL — Methylphenidate HCl Tab ER Osmotic Release (OSM) 36 MG: ORAL | 90 days supply | Qty: 180 | Fill #0 | Status: AC

## 2021-08-23 NOTE — Telephone Encounter (Signed)
Last OV was on 03/20/21.  Next OV is scheduled for 09/20/21.  Last RX was written on 05/17/21 for 180 tabs.   [place] [facility] Database has been reviewed.

## 2021-08-24 ENCOUNTER — Other Ambulatory Visit: Payer: Self-pay

## 2021-08-31 ENCOUNTER — Other Ambulatory Visit: Payer: Self-pay

## 2021-09-13 DIAGNOSIS — M9903 Segmental and somatic dysfunction of lumbar region: Secondary | ICD-10-CM | POA: Diagnosis not present

## 2021-09-13 DIAGNOSIS — R519 Headache, unspecified: Secondary | ICD-10-CM | POA: Diagnosis not present

## 2021-09-13 DIAGNOSIS — M5416 Radiculopathy, lumbar region: Secondary | ICD-10-CM | POA: Diagnosis not present

## 2021-09-13 DIAGNOSIS — M9901 Segmental and somatic dysfunction of cervical region: Secondary | ICD-10-CM | POA: Diagnosis not present

## 2021-09-15 ENCOUNTER — Other Ambulatory Visit: Payer: Self-pay

## 2021-09-20 ENCOUNTER — Other Ambulatory Visit: Payer: Self-pay

## 2021-09-20 ENCOUNTER — Encounter: Payer: Self-pay | Admitting: Family Medicine

## 2021-09-20 ENCOUNTER — Ambulatory Visit: Payer: 59 | Admitting: Family Medicine

## 2021-09-20 VITALS — BP 134/82 | HR 3 | Ht 62.0 in | Wt 188.0 lb

## 2021-09-20 DIAGNOSIS — G43009 Migraine without aura, not intractable, without status migrainosus: Secondary | ICD-10-CM | POA: Diagnosis not present

## 2021-09-20 DIAGNOSIS — Z79899 Other long term (current) drug therapy: Secondary | ICD-10-CM

## 2021-09-20 DIAGNOSIS — G35 Multiple sclerosis: Secondary | ICD-10-CM | POA: Diagnosis not present

## 2021-09-20 DIAGNOSIS — F988 Other specified behavioral and emotional disorders with onset usually occurring in childhood and adolescence: Secondary | ICD-10-CM

## 2021-09-20 DIAGNOSIS — R5383 Other fatigue: Secondary | ICD-10-CM | POA: Diagnosis not present

## 2021-09-20 MED ORDER — TOPIRAMATE 50 MG PO TABS
ORAL_TABLET | ORAL | 1 refills | Status: DC
  Filled 2021-09-20: qty 180, fill #0
  Filled 2021-10-26: qty 180, 90d supply, fill #0
  Filled 2022-01-23: qty 180, 90d supply, fill #1

## 2021-09-20 NOTE — Patient Instructions (Signed)
Below is our plan:  We will continue Concerta and topiramate as prescribed. I will order updated MRI for monitoring.   Please make sure you are staying well hydrated. I recommend 50-60 ounces daily. Well balanced diet and regular exercise encouraged. Consistent sleep schedule with 6-8 hours recommended.   Please continue follow up with care team as directed.   Follow up with Dr Epimenio Foot in 6 months   You may receive a survey regarding today's visit. I encourage you to leave honest feed back as I do use this information to improve patient care. Thank you for seeing me today!

## 2021-09-20 NOTE — Progress Notes (Signed)
Chief Complaint  Patient presents with   Follow-up    Rm 1, alone. Here for 6 month MS f/u. MS stable. No new sx.     HISTORY OF PRESENT ILLNESS:  09/20/21 ALL:  Carrie Nelson is a 41 y.o. female here today for follow up for RRMS. She completed [place] REMS monitoring. MRI brain stable 12/2019.   She feels she is doing well. No new or exacerbating symptoms. No changes in gait.   She feels mood is good. She continues fluoxetine 10mg  daily and alprazolam 0.5mg  nightly as needed. She is sleeping well. ADD is better on Concerta. She takes 36mg  most days but does use 72mg  occasionally.   Headaches are well managed on topiramate 50-100mg  daily.   Bowel/bladder habits are unchanged.   HISTORY (copied from Dr Bonnita Hollow previous note)  Carrie Nelson is a 41 y.o. woman who was diagnosed with relapsing remitting MS in 2007.     Update 03/20/2021: She feels her MS has been stable.    She denies any MS exacerbation or new symptom.  She did [place] in November 2017 and November 2018.    She has completed the 4 years of REMS.  Only issue has been some thyroid abnormalities       Gait is doing well.  She has no falls.   If she is tired, her right le seems heavy.    She has no weakness in the arms.  She will have some paresthesias on the left side of the face, this is worse in hot weather   She has had fewer UTIs on Rephresh probiotics. .   She has seen urology and no issues were found.    She feels she does not empty but does not have hesitation.   She is taking a probiotic and feels she may be doing better this month.   She is sleeping well on xanax (low dose prescribed by PCP).   She has some MS related ADD and Concerta has helped.(Takes 36 mg most days and 72 mg occasional days.     Mood is doing well on fluoxetine.   Se tied stopping Fluoxetine ut feels better on it so resumed 10 mg.   She is going through divorce.  .   She has had weight fluctuations.  She has gained some weight  (171 pound now was 144 pounds 12/2019).      Migraines have done very well on topiramate.  She had hyperthyroidism and she had radioactive iodine ablation.   She has hyothyroidism now and is on synthroid.           MS History:    She was diagnosed in 2007 but,  in retrospect, had right foot dragging that started around 2005. Then shortly after that she had an episode of left facial numbness. In 2007 she had an episode of slurred speech and worsening clumsiness.  MRI of the brain that was consistent with multiple sclerosis. She also had a lumbar puncture and her CSF compatible with the diagnosis of MS. Most of her symptoms improved over the next few months but she did not get 100% back to baseline and she continues to have minimal residual weakness, clumsiness and numbness. She was initially placed on Betaseron but switched to Tysabri because of some generalized side effects of Betaseron. She was seeing Dr. Tinnie Gens 2007 until 2014.   She was on Tysabri between 2008 and late 2014 when she stopped because of a desire to get  pregnant. She became pregnant in early 2015 and delivered in November 2015. She had not been on any disease modifying therapy since late 2014 and then started Tecfidera 02/2014.Marland Kitchen   She did not tolerate it well and started [facility] 12/2015.     Imaging: MRI of the brain 11/02/2015 shows T2/FLAIR hyperintense foci in the pons and the periventricular, juxtacortical and deep white matter of both hemispheres in a pattern and configuration consistent with chronic demyelinating plaque associated with multiple sclerosis. None of the foci enhanced after contrast administration. Compared to the MRI dated 07/23/2014, there is no interval change.   MRI of the cervical spine 01/29/2013 showed a normal spinal cord and no significant degenerative changes.   MRI 01/13/2020 showed multiple T2/FLAIR hyperintense foci in the hemispheres, left thalamus and left pons in a pattern and configuration consistent  with chronic demyelinating plaque associated with multiple sclerosis.  None of the foci enhance or appear to be acute.  Compared to the MRI from 07/23/2014, there are no new lesions.   REVIEW OF SYSTEMS: Out of a complete 14 system review of symptoms, the patient complains only of the following symptoms, headaches, attention deficit and all other reviewed systems are negative.   ALLERGIES: Allergies  Allergen Reactions   Scopolamine Other (See Comments)    Per pt caused vision problem     HOME MEDICATIONS: Outpatient Medications Prior to Visit  Medication Sig Dispense Refill   ALPRAZolam (XANAX) 0.5 MG tablet TAKE 1 TABLET BY MOUTH NIGHTLY AS NEEDED FOR SLEEP 90 tablet 1   FLUoxetine (PROZAC) 10 MG tablet Take 1 tablet by mouth once daily 90 tablet 3   levothyroxine (SYNTHROID) 175 MCG tablet Take 1 tablet (175 mcg total) by mouth once daily. Take on an empty stomach with a glass of water at least 30-60 minutes before breakfast. 30 tablet 11   loratadine (CLARITIN) 10 MG tablet Take 10 mg by mouth at bedtime.     methylphenidate (CONCERTA) 36 MG CR tablet TAKE 2 TABLETS BY MOUTH DAILY AS NEEDED 180 tablet 0   Norethindrone Acetate-Ethinyl Estradiol (LOESTRIN) 1.5-30 MG-MCG tablet TAKE 1 TABLET BY MOUTH DAILY. (Patient taking differently: Take 1 tablet by mouth daily.) 63 tablet 5   saccharomyces boulardii (FLORASTOR) 250 MG capsule Take 250 mg by mouth daily.     topiramate (TOPAMAX) 50 MG tablet TAKE 1 TO 2 TABLETS BY MOUTH NIGHTLY AT BEDTIME 180 tablet 1   XIIDRA 5 % SOLN Place 1 drop into both eyes in the morning and at bedtime.  6   No facility-administered medications prior to visit.     PAST MEDICAL HISTORY: Past Medical History:  Diagnosis Date   ADHD (attention deficit hyperactivity disorder)    Allergic asthma, mild intermittent, uncomplicated    per pt no rescue inhaler because she is under control (foilowed by pcp)   Anxiety    Complication of anesthesia    Finger  fracture, left    5th phalanx   Gait disturbance    due to MS   GERD (gastroesophageal reflux disease)    History of Graves' disease 2017   s/p RAI 03/ 2019;  hyperthyroidism attributed to [place] IV infusion   History of repair of hiatal hernia 12/18/2019   History of syncope    hx recurrent , 2004 normal MRI and echo,  per pt no syncope since 2004   Hypothyroidism, 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 Tedd Sias, per lov note in epic 06-12-2019 released to pcp   Migraines  Multiple sclerosis, relapsing-remitting [facility])    neurologist--- dr r. sater;  dx 2007   PONV (postoperative nausea and vomiting)    Tremor, essential    Wears glasses      PAST SURGICAL HISTORY: Past Surgical History:  Procedure Laterality Date   14 HOUR PH STUDY N/A 06/24/2019   Procedure: 24 HOUR PH STUDY;  Surgeon: Napoleon Form, MD;  Location: [facility];  Service: Endoscopy;  Laterality: N/A;   BUNIONECTOMY Left 2005   CESAREAN SECTION  01/22/2014   ESOPHAGEAL MANOMETRY N/A 06/24/2019   Procedure: ESOPHAGEAL MANOMETRY (EM);  Surgeon: Napoleon Form, MD;  Location: [facility];  Service: Endoscopy;  Laterality: N/A;   ESOPHAGOGASTRODUODENOSCOPY N/A 12/18/2019   Procedure: ESOPHAGOGASTRODUODENOSCOPY (EGD);  Surgeon: Shellia Cleverly, DO;  Location: [facility];  Service: Gastroenterology;  Laterality: N/A;   HIATAL HERNIA REPAIR N/A 12/18/2019   Procedure: LAPAROSCOPIC REPAIR OF HIATAL HERNIA;  Surgeon: Gaynelle Adu, MD;  Location: [facility];  Service: General;  Laterality: N/A;   KNEE ARTHROSCOPY W/ ACL RECONSTRUCTION Left 03/2008   @[facility]    OPEN REDUCTION INTERNAL FIXATION (ORIF) PROXIMAL PHALANX Left 12/08/2020   Procedure: OPEN REDUCTION INTERNAL FIXATION (ORIF) PROXIMAL PHALANX;  Surgeon: 12/10/2020, MD;  Location[facility];  Service: Orthopedics;  Laterality: Left;  with local   PH IMPEDANCE STUDY N/A 06/24/2019   Procedure: PH IMPEDANCE STUDY;  Surgeon:  06/26/2019, MD;  Location: [facility];  Service: Endoscopy;  Laterality: N/A;   TRANSORAL INCISIONLESS FUNDOPLICATION N/A 12/18/2019   Procedure: TRANSORAL INCISIONLESS FUNDOPLICATION;  Surgeon: 12/20/2019, DO;  Location: [facility];  Service: Gastroenterology;  Laterality: N/A;   UPPER GASTROINTESTINAL ENDOSCOPY  05/07/2019     FAMILY HISTORY: Family History  Problem Relation Age of Onset   Prostate cancer Father    Diabetes Father    Arthritis Other    Dementia Other    Hypertension Other    Heart disease Other    Kidney disease Maternal Grandmother    Esophageal cancer Maternal Grandfather    Heart disease Paternal Grandfather    Colon cancer Neg Hx    Rectal cancer Neg Hx    Stomach cancer Neg Hx      SOCIAL HISTORY: Social History   Socioeconomic History   Marital status: Married    Spouse name: Not on file   Number of children: 1   Years of education: Not on file   Highest education level: Not on file  Occupational History   Occupation: 07/07/2019  Tobacco Use   Smoking status: Never   Smokeless tobacco: Never  Vaping Use   Vaping Use: Never used  Substance and Sexual Activity   Alcohol use: Not Currently   Drug use: Never   Sexual activity: Yes    Birth control/protection: Pill  Other Topics Concern   Not on file  Social History Narrative   Patient lives with parents.   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   Patient is single.    Patient has a Animator.    Social Determinants of Health   Financial Resource Strain: Not on file  Food Insecurity: Not on file  Transportation Needs: Not on file  Physical Activity: Not on file  Stress: Not on file  Social Connections: Not on file  Intimate Partner Violence: Not on file     PHYSICAL EXAM  Vitals:   09/20/21 1050  BP: 134/82  Pulse: (!) 3  Weight: 188 lb (85.3 kg)  Height: 5\' 2"  (1.575 m)   Body mass index is  34.39 kg/m.  Generalized: Well developed, in no acute distress  Cardiology: normal rate and rhythm, no murmur auscultated  Respiratory: clear to auscultation bilaterally    Neurological examination  Mentation: Alert oriented to time, place, history taking. Follows all commands speech and language fluent Cranial nerve II-XII: Pupils were equal round reactive to light. Extraocular movements were full, visual field were full on confrontational test. Facial sensation and strength were normal. Head turning and shoulder shrug  were normal and symmetric. Motor: The motor testing reveals 5 over 5 strength of all 4 extremities. Good symmetric motor tone is noted throughout.  Sensory: Sensory testing is intact to soft touch on all 4 extremities. No evidence of extinction is noted.  Coordination: Cerebellar testing reveals good finger-nose-finger and heel-to-shin bilaterally.  Gait and station: Gait is normal.  Reflexes: Deep tendon reflexes are symmetric and normal bilaterally.    DIAGNOSTIC DATA (LABS, IMAGING, TESTING) - I reviewed patient records, labs, notes, testing and imaging myself where available.  Lab Results  Component Value Date   WBC 5.0 12/15/2019   HGB 13.1 12/15/2019   HCT 39.2 12/15/2019   MCV 90.3 12/15/2019   PLT 350 12/15/2019      Component Value Date/Time   NA 139 12/15/2019 1438   NA 141 01/05/2019 1455   NA 138 05/09/2012 0830   K 3.5 12/15/2019 1438   K 3.7 05/09/2012 0830   CL 111 12/15/2019 1438   CL 108 (H) 05/09/2012 0830   CO2 16 (L) 12/15/2019 1438   CO2 23 05/09/2012 0830   GLUCOSE 136 (H) 12/15/2019 1438   GLUCOSE 84 05/09/2012 0830   BUN 15 12/15/2019 1438   BUN 16 01/05/2019 1455   BUN 16 05/09/2012 0830   CREATININE 0.83 12/15/2019 1438   CREATININE 0.95 05/09/2012 0830   CALCIUM 9.0 12/15/2019 1438   CALCIUM 8.6 05/09/2012 0830   PROT 6.9 12/15/2019 1438   PROT 6.2 01/05/2019 1455   PROT 6.7 05/09/2012 0830   ALBUMIN 4.1 12/15/2019 1438    ALBUMIN 4.1 01/05/2019 1455   ALBUMIN 3.4 05/09/2012 0830   AST 14 (L) 12/15/2019 1438   AST 17 05/09/2012 0830   ALT 12 12/15/2019 1438   ALT 16 05/09/2012 0830   ALKPHOS 44 12/15/2019 1438   ALKPHOS 55 05/09/2012 0830   BILITOT 0.8 12/15/2019 1438   BILITOT 0.3 01/05/2019 1455   BILITOT 0.7 05/09/2012 0830   GFRNONAA >60 12/15/2019 1438   GFRNONAA >60 05/09/2012 0830   GFRAA 74 01/05/2019 1455   GFRAA >60 05/09/2012 0830   Lab Results  Component Value Date   CHOL 194 11/02/2016   HDL 61 11/02/2016   LDLCALC 106 (H) 11/02/2016   TRIG 137 11/02/2016   CHOLHDL 3.2 11/02/2016   Lab Results  Component Value Date   HGBA1C 5.1 11/02/2016   No results found for: "VITAMINB12" Lab Results  Component Value Date   TSH 1.260 01/05/2019        No data to display               No data to display           ASSESSMENT AND PLAN  41 y.o. 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 here with    Multiple sclerosis (HCC)  High risk medication use  Attention deficit disorder, unspecified hyperactivity presence  Common migraine without intractability  Other fatigue   Carrie Nelson is doing well, today. We will repeat MRI brain for monitoring. Labs have been stable with PCP. She will continue Concerta 36-72mg  daily and topiramate 50-100mg  daily. Healthy lifestyle habits encouraged. She will follow up with PCP as directed. She will return to see Dr Epimenio Foot in 6 months, sooner  if needed. She verbalizes understanding and agreement with this plan.   No orders of the defined types were placed in this encounter.    No orders of the defined types were placed in this encounter.    Shawnie Dapper, MSN, FNP-C 09/20/2021, 10:57 AM  [facility] [address], [address] [place], [place] [address] [phone]

## 2021-10-24 ENCOUNTER — Other Ambulatory Visit: Payer: Self-pay

## 2021-10-26 ENCOUNTER — Other Ambulatory Visit: Payer: Self-pay

## 2021-11-09 DIAGNOSIS — J4520 Mild intermittent asthma, uncomplicated: Secondary | ICD-10-CM | POA: Diagnosis not present

## 2021-11-09 DIAGNOSIS — Z8639 Personal history of other endocrine, nutritional and metabolic disease: Secondary | ICD-10-CM | POA: Diagnosis not present

## 2021-11-09 DIAGNOSIS — E7849 Other hyperlipidemia: Secondary | ICD-10-CM | POA: Diagnosis not present

## 2021-11-09 DIAGNOSIS — G35 Multiple sclerosis: Secondary | ICD-10-CM | POA: Diagnosis not present

## 2021-11-15 ENCOUNTER — Telehealth: Payer: Self-pay | Admitting: Family Medicine

## 2021-11-15 NOTE — Telephone Encounter (Signed)
MRI was ordered in July, I have reached out to the patient and she has never called me back to schedule. I will schedule when she decides to call us.

## 2021-11-16 ENCOUNTER — Other Ambulatory Visit: Payer: Self-pay

## 2021-11-16 DIAGNOSIS — Z8639 Personal history of other endocrine, nutritional and metabolic disease: Secondary | ICD-10-CM | POA: Diagnosis not present

## 2021-11-16 DIAGNOSIS — Z0000 Encounter for general adult medical examination without abnormal findings: Secondary | ICD-10-CM | POA: Diagnosis not present

## 2021-11-16 DIAGNOSIS — E7849 Other hyperlipidemia: Secondary | ICD-10-CM | POA: Diagnosis not present

## 2021-11-16 DIAGNOSIS — G35 Multiple sclerosis: Secondary | ICD-10-CM | POA: Diagnosis not present

## 2021-11-16 DIAGNOSIS — J4520 Mild intermittent asthma, uncomplicated: Secondary | ICD-10-CM | POA: Diagnosis not present

## 2021-11-16 MED ORDER — CEPHALEXIN 500 MG PO CAPS
ORAL_CAPSULE | ORAL | 0 refills | Status: DC
  Filled 2021-11-16: qty 15, 5d supply, fill #0

## 2021-11-30 ENCOUNTER — Other Ambulatory Visit: Payer: Self-pay

## 2021-11-30 ENCOUNTER — Ambulatory Visit (INDEPENDENT_AMBULATORY_CARE_PROVIDER_SITE_OTHER): Payer: 59 | Admitting: Obstetrics and Gynecology

## 2021-11-30 ENCOUNTER — Encounter: Payer: Self-pay | Admitting: Obstetrics and Gynecology

## 2021-11-30 VITALS — BP 121/88 | HR 137 | Ht 62.0 in | Wt 186.6 lb

## 2021-11-30 DIAGNOSIS — B3731 Acute candidiasis of vulva and vagina: Secondary | ICD-10-CM

## 2021-11-30 DIAGNOSIS — Z01419 Encounter for gynecological examination (general) (routine) without abnormal findings: Secondary | ICD-10-CM

## 2021-11-30 DIAGNOSIS — R399 Unspecified symptoms and signs involving the genitourinary system: Secondary | ICD-10-CM | POA: Diagnosis not present

## 2021-11-30 DIAGNOSIS — Z1231 Encounter for screening mammogram for malignant neoplasm of breast: Secondary | ICD-10-CM

## 2021-11-30 LAB — POCT URINALYSIS DIPSTICK
Bilirubin, UA: NEGATIVE
Blood, UA: NEGATIVE
Glucose, UA: NEGATIVE
Ketones, UA: NEGATIVE
Leukocytes, UA: NEGATIVE
Nitrite, UA: NEGATIVE
Protein, UA: NEGATIVE
Spec Grav, UA: 1.02 (ref 1.010–1.025)
Urobilinogen, UA: 0.2 E.U./dL
pH, UA: 6.5 (ref 5.0–8.0)

## 2021-11-30 MED ORDER — NORETHINDRONE ACET-ETHINYL EST 1.5-30 MG-MCG PO TABS
1.0000 | ORAL_TABLET | Freq: Every day | ORAL | 5 refills | Status: DC
  Filled 2021-11-30: qty 63, 63d supply, fill #0
  Filled 2021-12-19: qty 84, 84d supply, fill #0
  Filled 2022-03-22: qty 84, 84d supply, fill #1
  Filled 2022-05-24: qty 84, 84d supply, fill #2
  Filled 2022-07-27 – 2022-08-15 (×2): qty 84, 84d supply, fill #3
  Filled 2022-10-21 – 2022-11-16 (×2): qty 42, 42d supply, fill #4

## 2021-11-30 MED ORDER — FLUCONAZOLE 150 MG PO TABS
150.0000 mg | ORAL_TABLET | ORAL | 0 refills | Status: DC
  Filled 2021-11-30: qty 3, 9d supply, fill #0

## 2021-11-30 NOTE — Progress Notes (Signed)
Patients presents for annual exam today. She states still using continuous OCP's and not having a menstrual cycle at this time. Patient reports reoccurring UTI's over the last year and states recently finishing an antibiotic, states she is still having some pressure and light bleeding on toilet paper. Patient is up to date on pap smear and mammogram. Annual labs declined, preformed by PCP. Patient states no other questions or concerns at this time.

## 2021-11-30 NOTE — Progress Notes (Signed)
HPI:      Ms. Carrie Nelson is a 42 y.o. G1P1001 who LMP was No LMP recorded. (Menstrual status: Oral contraceptives).  Subjective:   She presents today for her annual examination.  She states that she was recently treated for a UTI and has had vaginal irritation and has noticed a small amount of blood when she wipes.  She does not specifically have urinary symptoms. She is also taking OCPs continuously without menses.    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 She does not have any pertinent problems on file. She  has a past surgical history that includes Knee arthroscopy w/ ACL reconstruction (Left, 03/2008); Bunionectomy (Left, 2005); Cesarean section (01/22/2014); Upper gastrointestinal endoscopy (05/07/2019); Esophageal manometry (N/A, 06/24/2019); 24 hour ph study (N/A, 06/24/2019); PH impedance study (N/A, 06/24/2019); Hiatal hernia repair (N/A, 12/18/2019); Transoral incisionless fundoplication (N/A, 93/26/7124); Esophagogastroduodenoscopy (N/A, 12/18/2019); and Open reduction internal fixation (orif) proximal phalanx (Left, 12/08/2020). Her family history includes Arthritis in an other family member; Dementia in an other family member; Diabetes in her father; Esophageal cancer in her maternal grandfather; Heart disease in her paternal grandfather and another family member; Hypertension in an other  family member; Kidney disease in her maternal grandmother; Prostate cancer in her father; Pulmonary embolism in her mother. She  reports that she has never smoked. She has never used smokeless tobacco. She reports that she does not currently use alcohol. She reports that she does not use drugs. She has a current medication list which includes the following prescription(s): alprazolam, fluoxetine, levothyroxine, loratadine, concerta, saccharomyces boulardii, topiramate, and norethindrone acetate-ethinyl estradiol. She is allergic to scopolamine.       Review of Systems:  Review of Systems  Constitutional: Denied constitutional symptoms, night sweats, recent illness, fatigue, fever, insomnia and weight loss.  Eyes: Denied eye symptoms, eye pain, photophobia, vision change and visual disturbance.  Ears/Nose/Throat/Neck: Denied ear, nose, throat or neck symptoms, hearing loss, nasal discharge, sinus congestion and sore throat.  Cardiovascular: Denied cardiovascular symptoms, arrhythmia, chest pain/pressure, edema, exercise intolerance, orthopnea and palpitations.  Respiratory: Denied pulmonary symptoms, asthma, pleuritic pain, productive sputum, cough, dyspnea and wheezing.  Gastrointestinal: Denied, gastro-esophageal reflux, melena, nausea and vomiting.  Genitourinary: See HPI for additional information.  Musculoskeletal: Denied musculoskeletal symptoms, stiffness, swelling, muscle weakness and myalgia.  Dermatologic: Denied dermatology symptoms, rash and scar.  Neurologic: Denied neurology symptoms, dizziness, headache, neck pain and syncope.  Psychiatric: Denied psychiatric symptoms, anxiety and depression.  Endocrine: Denied endocrine symptoms including hot flashes and night sweats.   Meds:   Current Outpatient Medications on File Prior to Visit  Medication Sig Dispense Refill   ALPRAZolam (XANAX) 0.5 MG tablet TAKE 1 TABLET BY MOUTH NIGHTLY AS NEEDED FOR SLEEP 90 tablet 1   FLUoxetine  (PROZAC) 10 MG tablet Take 1 tablet by mouth once daily 90 tablet 3   levothyroxine (SYNTHROID) 175 MCG tablet Take 1 tablet (175 mcg total) by mouth once daily. Take on an empty stomach with a glass of water at least  30-60 minutes before breakfast. 30 tablet 11   loratadine (CLARITIN) 10 MG tablet Take 10 mg by mouth at bedtime.     methylphenidate (CONCERTA) 36 MG CR tablet TAKE 2 TABLETS BY MOUTH DAILY AS NEEDED 180 tablet 0   saccharomyces boulardii (FLORASTOR) 250 MG capsule Take 250 mg by mouth daily.     topiramate (TOPAMAX) 50 MG tablet TAKE 1 TO 2 TABLETS BY MOUTH NIGHTLY AT BEDTIME 180 tablet 1   No current facility-administered medications on file prior to visit.     Objective:     Vitals:   11/30/21 1110  BP: 121/88  Pulse: (!) 137    Filed Weights   11/30/21 1110  Weight: 186 lb 9.6 oz (84.6 kg)              Physical examination General NAD, Conversant  HEENT Atraumatic; Op clear with mmm.  Normo-cephalic. Pupils reactive. Anicteric sclerae  Thyroid/Neck Smooth without nodularity or enlargement. Normal ROM.  Neck Supple.  Skin No rashes, lesions or ulceration. Normal palpated skin turgor. No nodularity.  Breasts: No masses or discharge.  Symmetric.  No axillary adenopathy.  Lungs: Clear to auscultation.No rales or wheezes. Normal Respiratory effort, no retractions.  Heart: NSR.  No murmurs or rubs appreciated. No periferal edema  Abdomen: Soft.  Non-tender.  No masses.  No HSM. No hernia  Extremities: Moves all appropriately.  Normal ROM for age. No lymphadenopathy.  Neuro: Oriented to PPT.  Normal mood. Normal affect.     Pelvic:   Vulva: Erythematous  Vagina: No lesions or abnormalities noted.  Thick white vaginal discharge likely consistent with monilia  Support: Normal pelvic support.  Urethra No masses tenderness or scarring.  Meatus Normal size without lesions or prolapse.  Cervix: Normal appearance.  No lesions.  Anus: Normal exam.  No lesions.   Perineum: Normal exam.  No lesions.        Bimanual   Uterus: Normal size.  Non-tender.  Mobile.  AV.  Adnexae: No masses.  Non-tender to palpation.  Cul-de-sac: Negative for abnormality.     Assessment:    G1P1001 Patient Active Problem List   Diagnosis Date Noted   Hiatal hernia    Gastroesophageal reflux disease    Globus sensation    Common migraine without intractability 07/02/2018   Seasonal allergies 09/19/2017   Hyperthyroidism 06/24/2017   Chronic venous insufficiency 05/10/2016   Varicose veins of both lower extremities with inflammation 04/02/2016   Pain in limb 04/02/2016   Type 2 diabetes mellitus (HCC) 04/02/2016   High risk medication use 10/18/2015   Constipation, chronic 08/18/2015   Asthma, exogenous 01/31/2015   Anxiety 01/31/2015   History of knee problem 01/31/2015   H/O thyroid disease 01/31/2015   Allergic rhinitis, seasonal 01/31/2015   Gait disturbance 07/08/2014   Other fatigue 07/08/2014   Attention deficit disorder 07/08/2014   Nonspecific abnormal results of other specified function study 09/06/2012   Dysarthria 09/06/2012   Knee Pain, Left  09/06/2012   Multiple sclerosis (HCC) 09/06/2012   Female genuine stress incontinence 11/06/2011   FOM (frequency of micturition) 11/06/2011   Chronic cystitis 11/06/2011   Gross hematuria 11/06/2011     1. Well woman exam with routine gynecological exam   2. UTI symptoms   3. Monilial vulvovaginitis     Patient might be having a small amount of bleeding from taking continuous OCPs versus possibility of monilia infection and vulvar irritation.   Plan:  1.  Basic Screening Recommendations The basic screening recommendations for asymptomatic women were discussed with the patient during her visit.  The age-appropriate recommendations were discussed with her and the rational for the tests reviewed.  When I am informed by the patient that another primary care physician has previously  obtained the age-appropriate tests and they are up-to-date, only outstanding tests are ordered and referrals given as necessary.  Abnormal results of tests will be discussed with her when all of her results are completed.  Routine preventative health maintenance measures emphasized: Exercise/Diet/Weight control, Tobacco Warnings, Alcohol/Substance use risks and Stress Management 2.  Diflucan for monilia 3.  Urine for C&S 4.  Continue OCPs. Orders Orders Placed This Encounter  Procedures   Urine Culture   POCT urinalysis dipstick     Meds ordered this encounter  Medications   Norethindrone Acetate-Ethinyl Estradiol (LOESTRIN) 1.5-30 MG-MCG tablet    Sig: TAKE 1 TABLET BY MOUTH DAILY.    Dispense:  63 tablet    Refill:  5            F/U  Return in about 1 year (around 12/01/2022) for Annual Physical.  Elonda Husky, M.D. 11/30/2021 12:16 PM

## 2021-12-02 LAB — URINE CULTURE: Organism ID, Bacteria: NO GROWTH

## 2021-12-14 DIAGNOSIS — Z8639 Personal history of other endocrine, nutritional and metabolic disease: Secondary | ICD-10-CM | POA: Diagnosis not present

## 2021-12-19 ENCOUNTER — Other Ambulatory Visit: Payer: Self-pay

## 2021-12-21 ENCOUNTER — Other Ambulatory Visit: Payer: Self-pay

## 2021-12-21 MED ORDER — ALPRAZOLAM 0.5 MG PO TABS
0.5000 mg | ORAL_TABLET | Freq: Every evening | ORAL | 1 refills | Status: DC | PRN
  Filled 2021-12-21: qty 90, 90d supply, fill #0
  Filled 2022-03-22: qty 90, 90d supply, fill #1

## 2021-12-31 DIAGNOSIS — S93401A Sprain of unspecified ligament of right ankle, initial encounter: Secondary | ICD-10-CM | POA: Diagnosis not present

## 2022-01-04 ENCOUNTER — Other Ambulatory Visit: Payer: Self-pay

## 2022-01-11 DIAGNOSIS — S93401A Sprain of unspecified ligament of right ankle, initial encounter: Secondary | ICD-10-CM | POA: Diagnosis not present

## 2022-01-23 ENCOUNTER — Other Ambulatory Visit: Payer: Self-pay

## 2022-02-28 ENCOUNTER — Other Ambulatory Visit: Payer: Self-pay

## 2022-02-28 MED ORDER — FLUOXETINE HCL 10 MG PO TABS
10.0000 mg | ORAL_TABLET | Freq: Every day | ORAL | 3 refills | Status: DC
  Filled 2022-02-28: qty 90, 90d supply, fill #0
  Filled 2022-05-25: qty 90, 90d supply, fill #1
  Filled 2022-08-27: qty 90, 90d supply, fill #2
  Filled 2022-11-27: qty 90, 90d supply, fill #3

## 2022-03-22 ENCOUNTER — Other Ambulatory Visit: Payer: Self-pay

## 2022-04-04 ENCOUNTER — Other Ambulatory Visit: Payer: Self-pay

## 2022-04-05 ENCOUNTER — Other Ambulatory Visit: Payer: Self-pay

## 2022-04-11 ENCOUNTER — Other Ambulatory Visit: Payer: Self-pay | Admitting: Obstetrics and Gynecology

## 2022-04-11 ENCOUNTER — Telehealth: Payer: Self-pay | Admitting: Family Medicine

## 2022-04-11 DIAGNOSIS — Z1231 Encounter for screening mammogram for malignant neoplasm of breast: Secondary | ICD-10-CM

## 2022-04-11 NOTE — Telephone Encounter (Signed)
Sent order to GI to schedule they will obtain Carrie Nelson [phone]

## 2022-04-11 NOTE — Telephone Encounter (Signed)
Pt is calling to schedule MRI. She is requesting a call back.

## 2022-04-18 ENCOUNTER — Telehealth: Payer: Self-pay | Admitting: Family Medicine

## 2022-04-19 ENCOUNTER — Other Ambulatory Visit: Payer: Self-pay

## 2022-04-19 ENCOUNTER — Other Ambulatory Visit: Payer: Self-pay | Admitting: Neurology

## 2022-04-20 ENCOUNTER — Other Ambulatory Visit: Payer: Self-pay

## 2022-04-22 ENCOUNTER — Other Ambulatory Visit: Payer: Self-pay

## 2022-04-23 ENCOUNTER — Other Ambulatory Visit: Payer: Self-pay

## 2022-04-23 MED FILL — Topiramate Tab 50 MG: ORAL | 90 days supply | Qty: 180 | Fill #0 | Status: AC

## 2022-04-23 NOTE — Telephone Encounter (Signed)
Error

## 2022-04-24 ENCOUNTER — Other Ambulatory Visit: Payer: Self-pay

## 2022-04-30 ENCOUNTER — Ambulatory Visit (INDEPENDENT_AMBULATORY_CARE_PROVIDER_SITE_OTHER): Payer: Commercial Managed Care - PPO

## 2022-04-30 ENCOUNTER — Encounter: Payer: Self-pay | Admitting: Obstetrics and Gynecology

## 2022-04-30 ENCOUNTER — Other Ambulatory Visit: Payer: Self-pay

## 2022-04-30 VITALS — BP 113/79 | HR 83 | Ht 62.0 in | Wt 185.3 lb

## 2022-04-30 DIAGNOSIS — R399 Unspecified symptoms and signs involving the genitourinary system: Secondary | ICD-10-CM

## 2022-04-30 DIAGNOSIS — R350 Frequency of micturition: Secondary | ICD-10-CM

## 2022-04-30 DIAGNOSIS — R3915 Urgency of urination: Secondary | ICD-10-CM | POA: Diagnosis not present

## 2022-04-30 DIAGNOSIS — R82998 Other abnormal findings in urine: Secondary | ICD-10-CM

## 2022-04-30 LAB — POCT URINALYSIS DIPSTICK
Bilirubin, UA: NEGATIVE
Blood, UA: NEGATIVE
Glucose, UA: NEGATIVE
Ketones, UA: NEGATIVE
Nitrite, UA: NEGATIVE
Protein, UA: NEGATIVE
Spec Grav, UA: <=1.005 — AB (ref 1.010–1.025)
Urobilinogen, UA: 0.2 E.U./dL
pH, UA: 6 (ref 5.0–8.0)

## 2022-04-30 MED ORDER — NITROFURANTOIN MONOHYD MACRO 100 MG PO CAPS
100.0000 mg | ORAL_CAPSULE | Freq: Two times a day (BID) | ORAL | 0 refills | Status: DC
  Filled 2022-04-30: qty 14, 7d supply, fill #0

## 2022-04-30 NOTE — Progress Notes (Signed)
    NURSE VISIT NOTE  Subjective:    Patient ID: GENETA MUDGE, female    DOB: March 01, 1980, 42 y.o.   MRN: [id number]       HPI  Patient is a 42 y.o. G35P1001 female who presents for urinary frequency, urinary urgency, flank pain, abdominal pain, pelvic pain, and cloudy malordorous urine for 4 days.  Patient does have a history of recurrent UTI.  Patient does not have a history of pyelonephritis.    Objective:    BP 113/79   Pulse 83   Ht '5\' 2"'$  (1.575 m)   Wt 185 lb 4.8 oz (84.1 kg)   BMI 33.89 kg/m    Lab Review  Results for orders placed or performed in visit on 04/30/22  POCT urinalysis dipstick  Result Value Ref Range   Color, UA yellow    Clarity, UA clear    Glucose, UA Negative Negative   Bilirubin, UA neg    Ketones, UA neg    Spec Grav, UA <=1.005 (A) 1.010 - 1.025   Blood, UA neg    pH, UA 6.0 5.0 - 8.0   Protein, UA Negative Negative   Urobilinogen, UA 0.2 0.2 or 1.0 E.U./dL   Nitrite, UA neg    Leukocytes, UA Moderate (2+) (A) Negative   Appearance     Odor      Assessment:   1. UTI symptoms   2. Urinary frequency   3. Urinary urgency   4. Leukocytes in urine      Plan:   Urine Culture Sent. Treatment  Macrobid 100 mg PO BID for 7 days. Maintain adequate hydration.  May use AZO OTC prn.  Follow up if symptoms worsen or fail to improve as anticipated, and as needed.    Marykay Lex, CMA

## 2022-05-02 LAB — URINE CULTURE

## 2022-05-10 DIAGNOSIS — E7849 Other hyperlipidemia: Secondary | ICD-10-CM | POA: Diagnosis not present

## 2022-05-10 DIAGNOSIS — G35 Multiple sclerosis: Secondary | ICD-10-CM | POA: Diagnosis not present

## 2022-05-16 ENCOUNTER — Ambulatory Visit: Payer: Commercial Managed Care - PPO | Admitting: Gastroenterology

## 2022-05-16 ENCOUNTER — Encounter: Payer: Self-pay | Admitting: Gastroenterology

## 2022-05-16 DIAGNOSIS — G35 Multiple sclerosis: Secondary | ICD-10-CM

## 2022-05-16 DIAGNOSIS — R0989 Other specified symptoms and signs involving the circulatory and respiratory systems: Secondary | ICD-10-CM

## 2022-05-16 DIAGNOSIS — R09A2 Foreign body sensation, throat: Secondary | ICD-10-CM | POA: Diagnosis not present

## 2022-05-16 DIAGNOSIS — Z9889 Other specified postprocedural states: Secondary | ICD-10-CM

## 2022-05-16 NOTE — Progress Notes (Signed)
Chief Complaint:    GERD, throat clearing  GI History: Carrie Nelson is a 42 year old female with a history of multiple sclerosis, hypothyroidism s/p iodine ablation with subsequent hypothyroidism (on Synthroid), with a history of GERD, s/p concomitant laparoscopic hiatal hernia repair and TIF 11/2019 as outlined below.   GERD history: -Index symptoms: Regurgitation, increased throat clearing, globus sensation.  Increased nocturnal throat clearing.  No dysphagia or heartburn -Exacerbating factors: Increased postprandial symptoms -Medications trialed: Nexium -Current medications: None -Complications: LPR, LES laxity with small HH    GERD evaluation: -Last EGD: 05/07/2019.  Images reviewed by me today -Barium esophagram:  09/2019: Normal motility -Esophageal Manometry: Normal, no HH -pH/Impedance (on PPI): Good acid control with [place] 2.2, increased reflux events with increased esophageal bolus exposure with predominantly weakly acid reflux (95 events with 92 being weakly acid) -recommended consideration of antireflux surgery -Laryngoscopy by ENT in [place] in 01/2019 with e/o reflux irritation.   Endoscopic History: -EGD (04/27/2019, Dr. 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 female presenting to the Gastroenterology Clinic for evaluation of throat clearing.  Was last seen by me on 01/04/2020 s/p cTIF on 12/18/2019.  Was still having throat clearing at that time (only 2 weeks postop) and had recommended following up in the GI clinic if symptoms did not resolve.  Since last appointment, had weaned off Nexium completely.  She has no heartburn, regurgitation.  No dysphagia and tolerating all p.o. intake.  However, still with globus sensation and increased throat clearing and  occasional cough to try to clear the feeling that something is caught in throat.   Worse with greasy, fried, heavy foods, carbonated beverages. Also worse with stress. No nocturnal sxs. Also there is a descriebd anxiety component to it where she becomes anxious about the globus sensation which then prompts her to increase her throat clearing.  Throat clearing has also been mentioned to her several times by her coworkers, which may also increase some of the symptoms related anxiety.   No recent abdominal or chest imaging for review.  Normal CMP and CBC earlier this month.  Review of systems:     No chest pain, no SOB, no fevers, no urinary sx   Past Medical History:  Diagnosis Date   ADHD (attention deficit hyperactivity disorder)    Allergic asthma, mild intermittent, uncomplicated    per pt no rescue inhaler because she is under control (foilowed by pcp)   Anxiety    Complication of anesthesia    Finger fracture, left    5th phalanx   Gait disturbance    due to MS   GERD (gastroesophageal reflux disease)    History of Graves' disease 2017   s/p RAI 03/ 2019;  hyperthyroidism attributed to [place] IV infusion   History of repair of hiatal hernia 12/18/2019   History of syncope    hx recurrent , 2004 normal MRI and echo,  per pt no syncope since 2004   Hypothyroidism, 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 Gabriel Carina, per lov note in epic 06-12-2019 released to pcp   Migraines    Multiple sclerosis, relapsing-remitting [facility])    neurologist--- dr Alfonso Patten. sater;  dx 2007   PONV (postoperative nausea and vomiting)    Tremor, essential    Wears glasses  Patient's surgical history, family medical history, social history, medications and allergies were all reviewed in Epic    Current Outpatient Medications  Medication Sig Dispense Refill   ALPRAZolam (XANAX) 0.5 MG tablet TAKE 1 TABLET BY MOUTH NIGHTLY AS NEEDED FOR SLEEP 90 tablet 1   ALPRAZolam (XANAX) 0.5 MG tablet TAKE 1  TABLET BY MOUTH NIGHTLY AS NEEDED FOR SLEEP 90 tablet 1   FLUoxetine (PROZAC) 10 MG tablet Take 1 tablet by mouth once daily 90 tablet 3   levothyroxine (SYNTHROID) 175 MCG tablet Take 1 tablet (175 mcg total) by mouth once daily. Take on an empty stomach with a glass of water at least 30-60 minutes before breakfast. 30 tablet 11   loratadine (CLARITIN) 10 MG tablet Take 10 mg by mouth at bedtime.     methylphenidate (CONCERTA) 36 MG CR tablet TAKE 2 TABLETS BY MOUTH DAILY AS NEEDED 180 tablet 0   Norethindrone Acetate-Ethinyl Estradiol (LOESTRIN) 1.5-30 MG-MCG tablet TAKE 1 TABLET BY MOUTH DAILY. 63 tablet 5   saccharomyces boulardii (FLORASTOR) 250 MG capsule Take 250 mg by mouth daily.     topiramate (TOPAMAX) 50 MG tablet Take 1-2 tablets (50-100 mg total) by mouth at bedtime. 180 tablet 1   No current facility-administered medications for this visit.    Physical Exam:     There were no vitals taken for this visit.  GENERAL:  Pleasant female in NAD PSYCH: : Cooperative, normal affect NEURO: Alert and oriented x 3   IMPRESSION and PLAN:    1) Increased throat clearing 2) Globus sensation 3) History of fundoplication With a long conversation today regarding her symptoms of increased throat clearing and globus sensation, along with less predominant symptom of intermittent cough.  She is otherwise without heartburn or regurgitation.  We discussed the possibility for GERD induced symptoms vs nonreflux etiology.  From a GI/reflux standpoint, possible diagnostic options include 1) pH/impedance testing, 2) EGD with Bravo placement, 3) esophagram, and 4) trial of high-dose PPI for diagnostic and therapeutic intent.  Since these atypical symptoms did not respond to PPIs in the past, would prefer to hold off on that route for now.  We discussed the advantages/limitations of each of the diagnostic methods, and ultimately decided on the following plan:  - EGD to evaluate wrap integrity and  whether or not hiatal hernias recurred, along with presence of erosive esophagitis - Bravo placement at time of EGD (off PPI) - Hold off on restarting any acid suppression therapy until EGD with Bravo placement complete - If EGD and Bravo normal, may trial SNRI (either in conjunction with or exchange for currently prescribed SSRI) - Depending on results, may also consider referral back to ENT and/or referral to SLP - Of note, there are data reporting up to 38% of MS patients with various forms of swallowing difficulties, to include chronic cough, chronic throat clearing, globus sensation   I spent over 40 minutes of time, including in depth chart review, independent review of results as outlined above, communicating results with the patient directly, face-to-face time with the patient, coordinating care, ordering studies and medications as appropriate, and documentation.         Lavena Bullion ,DO, FACG 05/16/2022, 2:01 PM

## 2022-05-16 NOTE — Patient Instructions (Addendum)
You have been scheduled for an endoscopy with Bravo. Please follow written instructions given to you at your visit today. If you use inhalers (even only as needed), please bring them with you on the day of your procedure.   _______________________________________________________  If your blood pressure at your visit was 140/90 or greater, please contact your primary care physician to follow up on this.  _______________________________________________________    If you are age 47 or younger, your body mass index should be between 19-25. Your There is no height or weight on file to calculate BMI. If this is out of the aformentioned range listed, please consider follow up with your Primary Care Provider.   __________________________________________________________  The [facility] providers would like to encourage you to use [facility] to communicate with providers for non-urgent requests or questions.  Due to long hold times on the telephone, sending your provider a message by [facility] may be a faster and more efficient way to get a response.  Please allow 48 business hours for a response.  Please remember that this is for non-urgent requests.   Due to recent changes in healthcare laws, you may see the results of your imaging and laboratory studies on MyChart before your provider has had a chance to review them.  We understand that in some cases there may be results that are confusing or concerning to you. Not all laboratory results come back in the same time frame and the provider may be waiting for multiple results in order to interpret others.  Please give [place] 48 hours in order for your provider to thoroughly review all the results before contacting the office for clarification of your results.    Thank you for choosing me and [facility].  Vito Cirigliano, D.O.

## 2022-05-17 ENCOUNTER — Other Ambulatory Visit: Payer: Self-pay

## 2022-05-17 DIAGNOSIS — J4520 Mild intermittent asthma, uncomplicated: Secondary | ICD-10-CM | POA: Diagnosis not present

## 2022-05-17 DIAGNOSIS — E7849 Other hyperlipidemia: Secondary | ICD-10-CM | POA: Diagnosis not present

## 2022-05-17 DIAGNOSIS — Z8639 Personal history of other endocrine, nutritional and metabolic disease: Secondary | ICD-10-CM | POA: Diagnosis not present

## 2022-05-17 DIAGNOSIS — G35 Multiple sclerosis: Secondary | ICD-10-CM | POA: Diagnosis not present

## 2022-05-17 DIAGNOSIS — F419 Anxiety disorder, unspecified: Secondary | ICD-10-CM | POA: Diagnosis not present

## 2022-05-17 MED ORDER — LEVOTHYROXINE SODIUM 150 MCG PO TABS
150.0000 ug | ORAL_TABLET | Freq: Every day | ORAL | 3 refills | Status: DC
  Filled 2022-05-17: qty 90, 90d supply, fill #0
  Filled 2022-08-21: qty 90, 90d supply, fill #1
  Filled 2022-11-16: qty 90, 90d supply, fill #2

## 2022-05-18 ENCOUNTER — Ambulatory Visit
Admission: RE | Admit: 2022-05-18 | Discharge: 2022-05-18 | Disposition: A | Discharge status: Home / Self Care | Payer: Commercial Managed Care - PPO | Source: Ambulatory Visit | Attending: Obstetrics and Gynecology | Admitting: Obstetrics and Gynecology

## 2022-05-18 ENCOUNTER — Other Ambulatory Visit: Payer: Self-pay | Admitting: Internal Medicine

## 2022-05-18 DIAGNOSIS — G35 Multiple sclerosis: Secondary | ICD-10-CM

## 2022-05-18 DIAGNOSIS — E7849 Other hyperlipidemia: Secondary | ICD-10-CM

## 2022-05-18 DIAGNOSIS — Z1231 Encounter for screening mammogram for malignant neoplasm of breast: Secondary | ICD-10-CM | POA: Diagnosis not present

## 2022-05-24 ENCOUNTER — Other Ambulatory Visit: Payer: Self-pay | Admitting: Neurology

## 2022-05-24 ENCOUNTER — Other Ambulatory Visit: Payer: Self-pay

## 2022-05-24 MED FILL — Methylphenidate HCl Tab ER Osmotic Release (OSM) 36 MG: ORAL | 90 days supply | Qty: 180 | Fill #0 | Status: AC

## 2022-05-24 NOTE — Telephone Encounter (Signed)
LAST SEEN BY [facility] 09/20/21 ROUTING TO PROVIDER TO REFILL  It wouldn't allow me to refill PRIOR Dispenses:   Dispensed Days Supply Quantity Provider Pharmacy  methylphenidate (CONCERTA) 36 MG CR tablet 08/24/2021 90 180 tablet Sater, Nanine Means, MD [facility]...       How do dispenses affect the score?

## 2022-05-25 ENCOUNTER — Other Ambulatory Visit: Payer: Self-pay

## 2022-05-28 ENCOUNTER — Other Ambulatory Visit: Payer: Self-pay

## 2022-06-07 ENCOUNTER — Ambulatory Visit
Admission: RE | Admit: 2022-06-07 | Discharge: 2022-06-07 | Disposition: A | Discharge status: Home / Self Care | Payer: Self-pay | Source: Ambulatory Visit | Attending: Internal Medicine | Admitting: Internal Medicine

## 2022-06-07 DIAGNOSIS — E7849 Other hyperlipidemia: Secondary | ICD-10-CM | POA: Insufficient documentation

## 2022-06-07 DIAGNOSIS — G35 Multiple sclerosis: Secondary | ICD-10-CM | POA: Insufficient documentation

## 2022-06-14 ENCOUNTER — Other Ambulatory Visit: Payer: Self-pay | Admitting: Internal Medicine

## 2022-06-14 DIAGNOSIS — K7689 Other specified diseases of liver: Secondary | ICD-10-CM

## 2022-06-15 ENCOUNTER — Other Ambulatory Visit: Payer: Self-pay

## 2022-06-15 MED ORDER — ALPRAZOLAM 0.5 MG PO TABS
0.5000 mg | ORAL_TABLET | Freq: Every evening | ORAL | 1 refills | Status: DC | PRN
  Filled 2022-06-22: qty 90, 90d supply, fill #0
  Filled 2022-09-19: qty 90, 90d supply, fill #1

## 2022-06-20 ENCOUNTER — Encounter: Payer: Self-pay | Admitting: Gastroenterology

## 2022-06-20 ENCOUNTER — Ambulatory Visit (AMBULATORY_SURGERY_CENTER): Payer: Commercial Managed Care - PPO | Admitting: Gastroenterology

## 2022-06-20 VITALS — BP 106/65 | HR 86 | Temp 98.4°F | Resp 19 | Ht 62.0 in | Wt 182.0 lb

## 2022-06-20 DIAGNOSIS — G35 Multiple sclerosis: Secondary | ICD-10-CM | POA: Diagnosis not present

## 2022-06-20 DIAGNOSIS — R0989 Other specified symptoms and signs involving the circulatory and respiratory systems: Secondary | ICD-10-CM | POA: Diagnosis not present

## 2022-06-20 DIAGNOSIS — R09A2 Foreign body sensation, throat: Secondary | ICD-10-CM | POA: Diagnosis not present

## 2022-06-20 DIAGNOSIS — K2980 Duodenitis without bleeding: Secondary | ICD-10-CM

## 2022-06-20 DIAGNOSIS — Z9889 Other specified postprocedural states: Secondary | ICD-10-CM

## 2022-06-20 DIAGNOSIS — K2100 Gastro-esophageal reflux disease with esophagitis, without bleeding: Secondary | ICD-10-CM | POA: Diagnosis not present

## 2022-06-20 DIAGNOSIS — F419 Anxiety disorder, unspecified: Secondary | ICD-10-CM | POA: Diagnosis not present

## 2022-06-20 MED ORDER — SODIUM CHLORIDE 0.9 % IV SOLN
500.0000 mL | Freq: Once | INTRAVENOUS | Status: DC
Start: 2022-06-20 — End: 2022-06-20

## 2022-06-20 NOTE — Progress Notes (Signed)
Vss nad trans to pacu 

## 2022-06-20 NOTE — Op Note (Signed)
[facility] Patient Name: Carrie Nelson Procedure Date: 06/20/2022 10:42 AM MRN: [id number] Endoscopist: Doristine Locks , MD, [id number] Age: 42 Referring MD:  Date of Birth: December 23, 1980 Gender: Female Account #: [id number] Procedure:                Upper GI endoscopy with biopsy and Bravo placement                            (off PPI) Indications:              Globus sensation, Increased throat clearing in the                            setting of previous concomitant laparoscopic hiatal                            hernia repair and TIF in 11/2019. Has otherwise                            weaned off all acid suppression therapy. No                            heartburn, regurgitation. Medicines:                Monitored Anesthesia Care Procedure:                Pre-Anesthesia Assessment:                           - Prior to the procedure, a History and Physical                            was performed, and patient medications and                            allergies were reviewed. The patient's tolerance of                            previous anesthesia was also reviewed. The risks                            and benefits of the procedure and the sedation                            options and risks were discussed with the patient.                            All questions were answered, and informed consent                            was obtained. Prior Anticoagulants: The patient has                            taken no anticoagulant or antiplatelet agents. ASA  Grade Assessment: II - A patient with mild systemic                            disease. After reviewing the risks and benefits,                            the patient was deemed in satisfactory condition to                            undergo the procedure.                           After obtaining informed consent, the endoscope was                            passed under direct vision.  Throughout the                            procedure, the patient's blood pressure, pulse, and                            oxygen saturations were monitored continuously. The                            Olympus Scope [id number] was introduced through the                            mouth, and advanced to the second part of duodenum.                            The upper GI endoscopy was accomplished without                            difficulty. The patient tolerated the procedure                            well. Scope In: Scope Out: Findings:                 The upper third of the esophagus and middle third                            of the esophagus were normal.                           Mild inflammation was found at the gastroesophageal                            junction, located 36 cm from the incisors. The                            BRAVO capsule with delivery system was introduced                            through the  mouth and advanced into the esophagus,                            such that the BRAVO pH capsule was positioned 30 cm                            from the incisors, which was 6 cm proximal to the                            GE junction. The BRAVO pH capsule was then deployed                            and attached to the esophageal mucosa. The delivery                            system was then withdrawn. Endoscopy was utilized                            for probe placement and diagnostic evaluation. The                            scope was reinserted to evaluate placement of the                            BRAVO capsule. Visualization showed the BRAVO                            capsule to be in an appropriate position.                           Evidence of a transoral incisionless fundoplication                            (TIF) was found at the gastroesophageal junction                            and in the cardia. There was good length to the                            wrap,  but under full air insufflation and valve                            agitation, there was apparent laxity to the wrap.                            This was traversed.                           The entire examined stomach was normal.                           Localized mildly erythematous mucosa without active  bleeding and with no stigmata of bleeding was found                            in the duodenal bulb. Biopsies were taken with a                            cold forceps for histology. Estimated blood loss                            was minimal.                           The second portion of the duodenum was normal. Complications:            No immediate complications. Estimated Blood Loss:     Estimated blood loss was minimal. Impression:               - Normal upper third of esophagus and middle third                            of esophagus.                           - Esophageal mucosal changes were present. Findings                            are suggestive of inflammation.                           - A transoral incisionless fundoplication (TIF) was                            found. There was good length to the wrap, but under                            full air insufflation and valve agitation, there                            was apparent laxity to the wrap.                           - Normal stomach.                           - Erythematous duodenopathy. Biopsied.                           - Normal second portion of the duodenum.                           - The BRAVO pH capsule was deployed. Recommendation:           - Patient has a contact number available for                            emergencies. The signs and symptoms of  potential                            delayed complications were discussed with the                            patient. Return to normal activities tomorrow.                            Written discharge instructions were provided  to the                            patient.                           - Resume previous diet.                           - Continue present medications.                           - Await pathology results.                           - No acid suppression therapy for the duration of                            the 48-hour Bravo study.                           - Follow-up with me in the GI clinic at appointment                            to be scheduled. Doristine Locks, MD 06/20/2022 11:12:05 AM

## 2022-06-20 NOTE — Patient Instructions (Signed)
Resume previous diet and medications. Awaiting pathology results. No acid suppression therapy for the duration of the 48 hour Bravo study. Follow up with me in the GI clinic at appointment to be scheduled. Post op Bravo PH Instructions provided to patient   YOU HAD AN ENDOSCOPIC PROCEDURE TODAY AT THE [facility]:   Refer to the procedure report that was given to you for any specific questions about what was found during the examination.  If the procedure report does not answer your questions, please call your gastroenterologist to clarify.  If you requested that your care partner not be given the details of your procedure findings, then the procedure report has been included in a sealed envelope for you to review at your convenience later.  YOU SHOULD EXPECT: Some feelings of bloating in the abdomen. Passage of more gas than usual.  Walking can help get rid of the air that was put into your GI tract during the procedure and reduce the bloating. If you had a lower endoscopy (such as a colonoscopy or flexible sigmoidoscopy) you may notice spotting of blood in your stool or on the toilet paper. If you underwent a bowel prep for your procedure, you may not have a normal bowel movement for a few days.  Please Note:  You might notice some irritation and congestion in your nose or some drainage.  This is from the oxygen used during your procedure.  There is no need for concern and it should clear up in a day or so.  SYMPTOMS TO REPORT IMMEDIATELY:  Following upper endoscopy (EGD)  Vomiting of blood or coffee ground material  New chest pain or pain under the shoulder blades  Painful or persistently difficult swallowing  New shortness of breath  Fever of 100F or higher  Black, tarry-looking stools  For urgent or emergent issues, a gastroenterologist can be reached at any hour by calling [phone]. Do not use MyChart messaging for urgent concerns.    DIET:  We do recommend a small  meal at first, but then you may proceed to your regular diet.  Drink plenty of fluids but you should avoid alcoholic beverages for 24 hours.  ACTIVITY:  You should plan to take it easy for the rest of today and you should NOT DRIVE or use heavy machinery until tomorrow (because of the sedation medicines used during the test).    FOLLOW UP: Our staff will call the number listed on your records the next business day following your procedure.  We will call around 7:15- 8:00 am to check on you and address any questions or concerns that you may have regarding the information given to you following your procedure. If we do not reach you, we will leave a message.     If any biopsies were taken you will be contacted by phone or by letter within the next 1-3 weeks.  Please call us at [phone] if you have not heard about the biopsies in 3 weeks.    SIGNATURES/CONFIDENTIALITY: You and/or your care partner have signed paperwork which will be entered into your electronic medical record.  These signatures attest to the fact that that the information above on your After Visit Summary has been reviewed and is understood.  Full responsibility of the confidentiality of this discharge information lies with you and/or your care-partner.

## 2022-06-20 NOTE — Progress Notes (Signed)
GASTROENTEROLOGY PROCEDURE H&P NOTE   Primary Care Physician: Lynnea Ferrier, MD    Reason for Procedure:  Increased throat clearing, globus sensation  Plan:    EGD with Bravo placement (off all acid suppression therapy)  Patient is appropriate for endoscopic procedure(s) in the ambulatory ([facility]) setting.  The nature of the procedure, as well as the risks, benefits, and alternatives were carefully and thoroughly reviewed with the patient. Ample time for discussion and questions allowed. The patient understood, was satisfied, and agreed to proceed.     HPI: Carrie Nelson is a 42 y.o. female who presents for EGD for evaluation of increased throat clearing and globus sensation in the setting of previous concomitant laparoscopic hiatal hernia repair and TIF in 11/2019.  Has otherwise weaned off all acid suppression therapy.  No heartburn, regurgitation.  No dysphagia.  Otherwise no changes in clinical history since last appointment with me on 05/16/2022.  GERD evaluation: -Last EGD: 05/07/2019.  Images reviewed by me today -Barium esophagram:  09/2019: Normal motility -Esophageal Manometry: Normal, no HH -pH/Impedance (on PPI): Good acid control with DeMeester 2.2, increased reflux events with increased esophageal bolus exposure with predominantly weakly acid reflux (95 events with 92 being weakly acid) -recommended consideration of antireflux surgery -Laryngoscopy by ENT in [place] in 01/2019 with e/o reflux irritation.   Endoscopic History: -EGD (04/27/2019, Dr. 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, <1 cm axial HH, appears 2.5-3 cm in transverse width -Laparoscopic HH repair and concomitant TIF (12/18/2019): Successful TIF with 24 fasteners placed   Past Medical History:  Diagnosis Date   ADHD (attention deficit hyperactivity disorder)    Allergic asthma, mild intermittent, uncomplicated    per pt no rescue inhaler because she is under control  (foilowed by pcp)   Anxiety    Complication of anesthesia    Finger fracture, left    5th phalanx   Gait disturbance    due to MS   GERD (gastroesophageal reflux disease)    History of Graves' disease 2017   s/p RAI 03/ 2019;  hyperthyroidism attributed to [place] IV infusion   History of repair of hiatal hernia 12/18/2019   History of syncope    hx recurrent , 2004 normal MRI and echo,  per pt no syncope since 2004   Hypothyroidism, 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 Tedd Sias, per lov note in epic 06-12-2019 released to pcp   Migraines    Multiple sclerosis, relapsing-remitting    neurologist--- dr r. sater;  dx 2007   PONV (postoperative nausea and vomiting)    Tremor, essential    Wears glasses     Past Surgical History:  Procedure Laterality Date   13 HOUR PH STUDY N/A 06/24/2019   Procedure: 24 HOUR PH STUDY;  Surgeon: Napoleon Form, MD;  Location: [facility];  Service: Endoscopy;  Laterality: N/A;   BUNIONECTOMY Left 2005   CESAREAN SECTION  01/22/2014   ESOPHAGEAL MANOMETRY N/A 06/24/2019   Procedure: ESOPHAGEAL MANOMETRY (EM);  Surgeon: Napoleon Form, MD;  Location: [facility];  Service: Endoscopy;  Laterality: N/A;   ESOPHAGOGASTRODUODENOSCOPY N/A 12/18/2019   Procedure: ESOPHAGOGASTRODUODENOSCOPY (EGD);  Surgeon: Shellia Cleverly, DO;  Location: [facility];  Service: Gastroenterology;  Laterality: N/A;   FINGER SURGERY Left    Pinky finger   HIATAL HERNIA REPAIR N/A 12/18/2019   Procedure: LAPAROSCOPIC REPAIR OF HIATAL HERNIA;  Surgeon: Gaynelle Adu, MD;  Location: [facility];  Service: General;  Laterality:  N/A;   KNEE ARTHROSCOPY W/ ACL RECONSTRUCTION Left 03/2008      OPEN REDUCTION INTERNAL FIXATION (ORIF) PROXIMAL PHALANX Left 12/08/2020   Procedure: OPEN REDUCTION INTERNAL FIXATION (ORIF) PROXIMAL PHALANX;  Surgeon: Gomez Cleverly, MD;  Location: [facility] [place];  Service: Orthopedics;  Laterality: Left;  with local   PH  IMPEDANCE STUDY N/A 06/24/2019   Procedure: PH IMPEDANCE STUDY;  Surgeon: Napoleon Form, MD;  Location: [facility];  Service: Endoscopy;  Laterality: N/A;   TRANSORAL INCISIONLESS FUNDOPLICATION N/A 12/18/2019   Procedure: TRANSORAL INCISIONLESS FUNDOPLICATION;  Surgeon: Shellia Cleverly, DO;  Location: [facility];  Service: Gastroenterology;  Laterality: N/A;   UPPER GASTROINTESTINAL ENDOSCOPY  05/07/2019    Prior to Admission medications   Medication Sig Start Date End Date Taking? Authorizing Provider  ALPRAZolam (XANAX) 0.5 MG tablet TAKE 1 TABLET BY MOUTH NIGHTLY AS NEEDED FOR SLEEP 06/15/21     ALPRAZolam (XANAX) 0.5 MG tablet TAKE 1 TABLET BY MOUTH NIGHTLY AS NEEDED FOR SLEEP 06/15/22     FLUoxetine (PROZAC) 10 MG tablet Take 1 tablet by mouth once daily 02/28/22     levothyroxine (SYNTHROID) 150 MCG tablet Take 1 tablet (150 mcg total) by mouth once daily Take on an empty stomach with a glass of water at least 30-60 minutes before breakfast. 05/17/22     levothyroxine (SYNTHROID) 175 MCG tablet Take 1 tablet (175 mcg total) by mouth once daily. Take on an empty stomach with a glass of water at least 30-60 minutes before breakfast. 05/04/21     loratadine (CLARITIN) 10 MG tablet Take 10 mg by mouth at bedtime.    [provider]  methylphenidate (CONCERTA) 36 MG CR tablet TAKE 2 TABLETS BY MOUTH DAILY AS NEEDED 05/24/22   Sater, Pearletha Furl, MD  Norethindrone Acetate-Ethinyl Estradiol (LOESTRIN) 1.5-30 MG-MCG tablet TAKE 1 TABLET BY MOUTH DAILY. 11/30/21 11/30/22  Linzie Collin, MD  saccharomyces boulardii (FLORASTOR) 250 MG capsule Take 250 mg by mouth daily.    [provider]  topiramate (TOPAMAX) 50 MG tablet Take 1-2 tablets (50-100 mg total) by mouth at bedtime. 04/23/22 04/23/23  Shawnie Dapper, NP    Current Outpatient Medications  Medication Sig Dispense Refill   ALPRAZolam (XANAX) 0.5 MG tablet TAKE 1 TABLET BY MOUTH NIGHTLY AS NEEDED FOR SLEEP 90 tablet 1    ALPRAZolam (XANAX) 0.5 MG tablet TAKE 1 TABLET BY MOUTH NIGHTLY AS NEEDED FOR SLEEP 90 tablet 1   FLUoxetine (PROZAC) 10 MG tablet Take 1 tablet by mouth once daily 90 tablet 3   levothyroxine (SYNTHROID) 150 MCG tablet Take 1 tablet (150 mcg total) by mouth once daily Take on an empty stomach with a glass of water at least 30-60 minutes before breakfast. 90 tablet 3   levothyroxine (SYNTHROID) 175 MCG tablet Take 1 tablet (175 mcg total) by mouth once daily. Take on an empty stomach with a glass of water at least 30-60 minutes before breakfast. 30 tablet 11   loratadine (CLARITIN) 10 MG tablet Take 10 mg by mouth at bedtime.     methylphenidate (CONCERTA) 36 MG CR tablet TAKE 2 TABLETS BY MOUTH DAILY AS NEEDED 180 tablet 0   Norethindrone Acetate-Ethinyl Estradiol (LOESTRIN) 1.5-30 MG-MCG tablet TAKE 1 TABLET BY MOUTH DAILY. 63 tablet 5   saccharomyces boulardii (FLORASTOR) 250 MG capsule Take 250 mg by mouth daily.     topiramate (TOPAMAX) 50 MG tablet Take 1-2 tablets (50-100 mg total) by mouth at bedtime. 180 tablet 1  Current Facility-Administered Medications  Medication Dose Route Frequency Provider Last Rate Last Admin   0.9 %  sodium chloride infusion  500 mL Intravenous Once Averill Winters V, DO        Allergies as of 06/20/2022 - Review Complete 06/20/2022  Allergen Reaction Noted   Scopolamine Other (See Comments) 12/01/2020    Family History  Problem Relation Age of Onset   Pulmonary embolism Mother    Prostate cancer Father    Diabetes Father    Kidney disease Maternal Grandmother    Esophageal cancer Maternal Grandfather    Heart disease Paternal Grandfather    Arthritis Other    Dementia Other    Hypertension Other    Heart disease Other    Colon cancer Neg Hx    Rectal cancer Neg Hx    Stomach cancer Neg Hx    Breast cancer Neg Hx     Social History   Socioeconomic History   Marital status: Married    Spouse name: Not on file   Number of children: 1    Years of education: Not on file   Highest education level: Not on file  Occupational History   Occupation: Tree surgeon  Tobacco Use   Smoking status: Never   Smokeless tobacco: Never  Vaping Use   Vaping Use: Never used  Substance and Sexual Activity   Alcohol use: Not Currently   Drug use: Never   Sexual activity: Not Currently    Birth control/protection: Pill  Other Topics Concern   Not on file  Social History Narrative   Patient lives with parents.   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    Patient has a Probation officer.    Social Determinants of Health   Financial Resource Strain: Not on file  Food Insecurity: Not on file  Transportation Needs: Not on file  Physical Activity: Not on file  Stress: Not on file  Social Connections: Not on file  Intimate Partner Violence: Not on file    Physical Exam: Vital signs in last 24 hours:  117/71   Pulse 77   Temp 98.4 F (36.9 C) (Temporal)   Ht  (1.575 m)   Wt 182 lb (82.6 kg)   SpO2 100%   BMI 33.29 kg/m  GEN: NAD EYE: Sclerae anicteric ENT: MMM CV: Non-tachycardic Pulm: CTA b/l GI: Soft, NT/ND NEURO:  Alert & Oriented x 3   Doristine Locks, DO [facility]   06/20/2022 10:22 AM

## 2022-06-20 NOTE — Progress Notes (Signed)
Pt's states no medical or surgical changes since previsit or office visit. 

## 2022-06-20 NOTE — Progress Notes (Addendum)
Capsule expiration date- 09/21/23  Capsule ID number [id number]  LES measurement: 36 (capsule placed 6 cm above LES) 30  Time of implant: 1101

## 2022-06-21 ENCOUNTER — Ambulatory Visit
Admission: RE | Admit: 2022-06-21 | Discharge: 2022-06-21 | Disposition: A | Discharge status: Home / Self Care | Payer: Commercial Managed Care - PPO | Source: Ambulatory Visit | Attending: Internal Medicine | Admitting: Internal Medicine

## 2022-06-21 ENCOUNTER — Telehealth: Payer: Self-pay | Admitting: *Deleted

## 2022-06-21 DIAGNOSIS — K7689 Other specified diseases of liver: Secondary | ICD-10-CM | POA: Diagnosis not present

## 2022-06-21 NOTE — Telephone Encounter (Signed)
  Follow up Call-     06/20/2022   10:09 AM  Call back number  Post procedure Call Back phone  # [phone]  Permission to leave phone message Yes     Patient questions:  Do you have a fever, pain , or abdominal swelling? No. Pain Score  0 *  Have you tolerated food without any problems? Yes.    Have you been able to return to your normal activities? Yes.    Do you have any questions about your discharge instructions: Diet   No. Medications  No. Follow up visit  No.  Do you have questions or concerns about your Care? No.  Actions: * If pain score is 4 or above: No action needed, pain <4.

## 2022-06-22 ENCOUNTER — Other Ambulatory Visit: Payer: Self-pay

## 2022-06-22 DIAGNOSIS — Z8639 Personal history of other endocrine, nutritional and metabolic disease: Secondary | ICD-10-CM | POA: Diagnosis not present

## 2022-06-28 ENCOUNTER — Telehealth: Payer: Self-pay | Admitting: Gastroenterology

## 2022-06-28 NOTE — Telephone Encounter (Signed)
Results from the Bravo study reviewed and notable for the following: - Procedure completed off all acid suppression therapy  1.  Increased esophageal acid exposure with % time ph <4 of 16.9%.  DeMeester score of 66 (normal <14.7). 2.  Esophageal acid exposure is abnormal and both upright and supine positions 3.  No symptom correlation for dysphagia based on SAP  Impression: These results are consistent with significant gastroesophageal reflux disease with active esophageal acid exposure.  Since her reflux symptoms have not been terribly responsive to PPI in the past, recommend starting vonoprazan 20 mg daily x 8 weeks, then reduce to 10 mg daily and schedule follow-up appointment with me in the GI clinic.  If vonoprazan not covered, trial Aciphex 20 mg daily x 8 weeks, and if clinical improvement can hopefully reduce to 10 mg daily.

## 2022-06-29 ENCOUNTER — Other Ambulatory Visit: Payer: Self-pay

## 2022-06-29 MED ORDER — VOQUEZNA 20 MG PO TABS
1.0000 | ORAL_TABLET | Freq: Every day | ORAL | 0 refills | Status: DC
  Filled 2022-06-29: qty 56, 56d supply, fill #0
  Filled 2022-07-16: qty 30, 30d supply, fill #0
  Filled 2022-08-15: qty 26, 26d supply, fill #1

## 2022-06-29 MED ORDER — VOQUEZNA 10 MG PO TABS
1.0000 | ORAL_TABLET | Freq: Every day | ORAL | 3 refills | Status: DC
  Filled 2022-06-29 – 2022-09-11 (×4): qty 90, 90d supply, fill #0
  Filled 2022-09-17 – 2022-09-19 (×2): qty 30, 30d supply, fill #0
  Filled 2022-10-21 – 2022-10-22 (×2): qty 30, 30d supply, fill #1
  Filled 2022-11-21 – 2022-12-19 (×3): qty 30, 30d supply, fill #2
  Filled 2023-01-14: qty 30, 30d supply, fill #3
  Filled 2023-02-18: qty 30, 30d supply, fill #4
  Filled 2023-03-25: qty 30, 30d supply, fill #5
  Filled 2023-04-26: qty 30, 30d supply, fill #6
  Filled 2023-05-27 – 2023-06-19 (×3): qty 30, 30d supply, fill #7

## 2022-06-29 NOTE — Addendum Note (Signed)
Addended by: Missy Sabins on: 06/29/2022 03:01 PM   Modules accepted: Orders

## 2022-06-29 NOTE — Telephone Encounter (Signed)
Called and spoke with patient regarding Bravo results and recommendations. Patient is aware that prescriptions for Voquezna are being sent to [facility] since this is a new medication and coverage is likely limited. [facility] with work on [employer] and get medication shipped to patient. Pt has been advised to let us know if she can't get the medication for any reason and we will send in alternative for her. Pt has been scheduled for a f/u with Dr. Barron Alvine on 09/18/22 at 1:30 pm. Patient verbalized understanding of all information and had no concerns at the end of the call.

## 2022-07-02 ENCOUNTER — Other Ambulatory Visit: Payer: Self-pay

## 2022-07-02 MED ORDER — VOQUEZNA 10 MG PO TABS: 1.0000 | ORAL_TABLET | Freq: Every day | ORAL | 3 refills | Status: DC

## 2022-07-02 MED ORDER — VOQUEZNA 20 MG PO TABS: 1.0000 | ORAL_TABLET | Freq: Every day | ORAL | 0 refills | Status: DC

## 2022-07-02 NOTE — Telephone Encounter (Signed)
Inbound call from patient, is requesting Voquenzna medication to be sent to [facility] outpatient pharmacy, also would like PA to go through them not [facility].

## 2022-07-02 NOTE — Telephone Encounter (Signed)
Will you please initiate PA for Voquezna 20 MG please? See telephone notes.  Called and cancelled Rx per patient through [facility]. Rx is transferred to [facility] outpatient pharmacy. Patient made aware.

## 2022-07-03 ENCOUNTER — Other Ambulatory Visit: Payer: Self-pay

## 2022-07-03 ENCOUNTER — Telehealth: Payer: Self-pay | Admitting: Pharmacy Technician

## 2022-07-03 NOTE — Telephone Encounter (Signed)
Patient Advocate Encounter  Received notification from [facility] that prior authorization for VOQUEZNA 20MG  is required.   PA submitted on 5.7.24 Key [id number] Status is pending

## 2022-07-05 ENCOUNTER — Other Ambulatory Visit: Payer: Self-pay

## 2022-07-06 ENCOUNTER — Encounter: Payer: Self-pay | Admitting: Gastroenterology

## 2022-07-06 ENCOUNTER — Other Ambulatory Visit: Payer: Self-pay

## 2022-07-10 NOTE — Telephone Encounter (Signed)
See additional encounter

## 2022-07-10 NOTE — Telephone Encounter (Signed)
Please see notes below.

## 2022-07-10 NOTE — Telephone Encounter (Signed)
Please see notes below and advise 

## 2022-07-10 NOTE — Telephone Encounter (Signed)
PA has been DENIED:   This request has not been approved. Based on the information sent for review, the requested drug did not meet our guideline rules. To get the request approved, your doctor needs to show that you have met the guideline rules below. If you have questions, please call your doctor. In some cases, the requested drug or alternatives offered may have other guideline rules that need to be met. Your provider requested Voquezna tablets to manage your condition of erosive esophagitis (inflammation in your esophagus that causes damage to the tissue lining). When used for the treatment of erosive esophagitis, our guideline named VONOPRAZAN (generic for Voquezna tablets) requires that your diagnosis has been confirmed by endoscopy (a procedure used to look inside your body). This request was denied because we did not receive information that you meet this requirement. Please work with your doctor to use a different medication or get [place] more information if it will allow [place] to approve this request. A written notification letter will follow with additional details.

## 2022-07-11 ENCOUNTER — Other Ambulatory Visit: Payer: Self-pay

## 2022-07-11 NOTE — Telephone Encounter (Signed)
Spoke with patient. Stated she would like to process with appeal for Voquezna. Asked her if she would like to send Rx via [facility] which will obtain PA and initiate appeal. Patient denied again for right now. Advised her we would send appeal to her insurance and if it is denied we can still send it to [facility]. Patient voiced understanding. Called Patient insurance, stated we need to submit endoscopy report showing patient has erosive esophagitis. Will be faxing appeal as soon as possible.

## 2022-07-13 NOTE — Telephone Encounter (Signed)
Called and LVM to inform the patient that I have been trying to fax an appeal for Voquezna since yesterday but has not gone through yet. Will let the patient know once it is procced.

## 2022-07-16 ENCOUNTER — Other Ambulatory Visit: Payer: Self-pay

## 2022-07-16 NOTE — Telephone Encounter (Signed)
Received fax from [employer] stating that appeal for Voquezna 20 MG has been approved from 07/14/22 to 09/08/22. Patient is notified. Called the pharmacy to make sure if separate PA is required for Voquezna 10 MG. Stated patient doesn't need separate PA for a lower maintenance dose.

## 2022-07-27 ENCOUNTER — Other Ambulatory Visit: Payer: Self-pay

## 2022-07-27 MED FILL — Topiramate Tab 50 MG: ORAL | 90 days supply | Qty: 180 | Fill #1 | Status: AC

## 2022-08-09 ENCOUNTER — Ambulatory Visit: Payer: Commercial Managed Care - PPO | Admitting: Family Medicine

## 2022-08-09 ENCOUNTER — Other Ambulatory Visit: Payer: Self-pay

## 2022-08-09 ENCOUNTER — Encounter: Payer: Self-pay | Admitting: Family Medicine

## 2022-08-09 VITALS — BP 122/80 | HR 89 | Ht 62.0 in | Wt 186.5 lb

## 2022-08-09 DIAGNOSIS — G43009 Migraine without aura, not intractable, without status migrainosus: Secondary | ICD-10-CM

## 2022-08-09 DIAGNOSIS — Z79899 Other long term (current) drug therapy: Secondary | ICD-10-CM

## 2022-08-09 DIAGNOSIS — R5383 Other fatigue: Secondary | ICD-10-CM

## 2022-08-09 DIAGNOSIS — G35 Multiple sclerosis: Secondary | ICD-10-CM

## 2022-08-09 DIAGNOSIS — F988 Other specified behavioral and emotional disorders with onset usually occurring in childhood and adolescence: Secondary | ICD-10-CM

## 2022-08-09 MED ORDER — METHYLPHENIDATE HCL ER 36 MG PO TB24
ORAL_TABLET | ORAL | 0 refills | Status: DC
  Filled 2022-08-09: qty 60, fill #0
  Filled 2022-11-21: qty 60, 30d supply, fill #0

## 2022-08-09 MED ORDER — TOPIRAMATE 50 MG PO TABS
50.0000 mg | ORAL_TABLET | Freq: Every evening | ORAL | 1 refills | Status: DC
  Filled 2022-08-09 – 2022-10-21 (×2): qty 180, 90d supply, fill #0
  Filled 2023-01-18: qty 180, 90d supply, fill #1

## 2022-08-09 NOTE — Patient Instructions (Signed)
Below is our plan:  We will continue current plan. I have refilled your meds. We will order an MRI for evaluation. Let me know if you have any trouble getting it scheduled.   Please make sure you are staying well hydrated. I recommend 50-60 ounces daily. Well balanced diet and regular exercise encouraged. Consistent sleep schedule with 6-8 hours recommended.   Please continue follow up with care team as directed.   Follow up with Dr Epimenio Foot in 6 months   You may receive a survey regarding today's visit. I encourage you to leave honest feed back as I do use this information to improve patient care. Thank you for seeing me today!

## 2022-08-09 NOTE — Progress Notes (Signed)
Chief Complaint  Patient presents with   Follow-up    Rm 2. Patient alone, no reports of new symptoms.     HISTORY OF PRESENT ILLNESS:  08/09/22 ALL:  Carrie Nelson is a 42 y.o. female here today for follow up for RRMS. She completed [facility] REMS monitoring. MRI brain stable 12/2019. We ordered update imaging but held off due to expense.   She feels she is doing well. No new or exacerbating symptoms. No changes in gait.   She feels mood is good. She continues fluoxetine 10mg  daily and alprazolam 0.5mg  nightly as needed. She is sleeping well. ADD is better on Concerta. She takes 36mg  most days but does use 72mg  occasionally.   Headaches are well managed on topiramate 50-100mg  daily. She has not had any recent headaches.   Bowel/bladder habits are unchanged. She was started on Voquezna through GI.   HISTORY (copied from Dr Bonnita Hollow previous note)  Carrie Nelson is a 42 y.o. woman who was diagnosed with relapsing remitting MS in 2007.     Update 03/20/2021: She feels her MS has been stable.    She denies any MS exacerbation or new symptom.  She did [place] in November 2017 and November 2018.    She has completed the 4 years of REMS.  Only issue has been some thyroid abnormalities       Gait is doing well.  She has no falls.   If she is tired, her right le seems heavy.    She has no weakness in the arms.  She will have some paresthesias on the left side of the face, this is worse in hot weather   She has had fewer UTIs on Rephresh probiotics. .   She has seen urology and no issues were found.    She feels she does not empty but does not have hesitation.   She is taking a probiotic and feels she may be doing better this month.   She is sleeping well on xanax (low dose prescribed by PCP).   She has some MS related ADD and Concerta has helped.(Takes 36 mg most days and 72 mg occasional days.     Mood is doing well on fluoxetine.   Se tied stopping Fluoxetine ut feels better on  it so resumed 10 mg.   She is going through divorce.  .   She has had weight fluctuations.  She has gained some weight (171 pound now was 144 pounds 12/2019).      Migraines have done very well on topiramate.  She had hyperthyroidism and she had radioactive iodine ablation.   She has hyothyroidism now and is on synthroid.           MS History:    She was diagnosed in 2007 but,  in retrospect, had right foot dragging that started around 2005. Then shortly after that she had an episode of left facial numbness. In 2007 she had an episode of slurred speech and worsening clumsiness.  MRI of the brain that was consistent with multiple sclerosis. She also had a lumbar puncture and her CSF compatible with the diagnosis of MS. Most of her symptoms improved over the next few months but she did not get 100% back to baseline and she continues to have minimal residual weakness, clumsiness and numbness. She was initially placed on Betaseron but switched to Tysabri because of some generalized side effects of Betaseron. She was seeing Dr. Tinnie Gens 2007 until 2014.  She was on Tysabri between 2008 and late 2014 when she stopped because of a desire to get pregnant. She became pregnant in early 2015 and delivered in November 2015. She had not been on any disease modifying therapy since late 2014 and then started Tecfidera 02/2014.Marland Kitchen   She did not tolerate it well and started [facility] 12/2015.     Imaging: MRI of the brain 11/02/2015 shows T2/FLAIR hyperintense foci in the pons and the periventricular, juxtacortical and deep white matter of both hemispheres in a pattern and configuration consistent with chronic demyelinating plaque associated with multiple sclerosis. None of the foci enhanced after contrast administration. Compared to the MRI dated 07/23/2014, there is no interval change.   MRI of the cervical spine 01/29/2013 showed a normal spinal cord and no significant degenerative changes.   MRI 01/13/2020 showed multiple  T2/FLAIR hyperintense foci in the hemispheres, left thalamus and left pons in a pattern and configuration consistent with chronic demyelinating plaque associated with multiple sclerosis.  None of the foci enhance or appear to be acute.  Compared to the MRI from 07/23/2014, there are no new lesions.   REVIEW OF SYSTEMS: Out of a complete 14 system review of symptoms, the patient complains only of the following symptoms, headaches, attention deficit and all other reviewed systems are negative.   ALLERGIES: Allergies  Allergen Reactions   Scopolamine Other (See Comments)    Per pt caused vision problem     HOME MEDICATIONS: Outpatient Medications Prior to Visit  Medication Sig Dispense Refill   ALPRAZolam (XANAX) 0.5 MG tablet Take 1 tablet (0.5 mg total) by mouth at bedtime as needed for sleep. 90 tablet 1   FLUoxetine (PROZAC) 10 MG tablet Take 1 tablet by mouth once daily 90 tablet 3   levothyroxine (SYNTHROID) 150 MCG tablet Take 1 tablet (150 mcg total) by mouth once daily Take on an empty stomach with a glass of water at least 30-60 minutes before breakfast. 90 tablet 3   loratadine (CLARITIN) 10 MG tablet Take 10 mg by mouth at bedtime.     Norethindrone Acetate-Ethinyl Estradiol (LOESTRIN) 1.5-30 MG-MCG tablet Take 1 tablet by mouth daily. 63 tablet 5   saccharomyces boulardii (FLORASTOR) 250 MG capsule Take 250 mg by mouth daily.     Vonoprazan Fumarate (VOQUEZNA) 10 MG TABS Take 1 tablet by mouth daily. 90 tablet 3   Vonoprazan Fumarate (VOQUEZNA) 20 MG TABS Take 1 tablet by mouth daily. For 8 weeks 56 tablet 0   methylphenidate 18 MG PO CR tablet Take 18 mg by mouth daily.     topiramate (TOPAMAX) 50 MG tablet Take 1-2 tablets (50-100 mg total) by mouth at bedtime. 180 tablet 1   Vonoprazan Fumarate (VOQUEZNA) 10 MG TABS Take 1 tablet by mouth daily. (Patient not taking: Reported on 08/09/2022) 90 tablet 3   Vonoprazan Fumarate (VOQUEZNA) 20 MG TABS Take 1 tablet by mouth daily. For  8 weeks (Patient not taking: Reported on 08/09/2022) 56 tablet 0   No facility-administered medications prior to visit.     PAST MEDICAL HISTORY: Past Medical History:  Diagnosis Date   Allergic asthma, mild intermittent, uncomplicated    per pt no rescue inhaler because she is under control (foilowed by pcp)   Allergy    Anxiety    Complication of anesthesia    Finger fracture, left    5th phalanx   Gait disturbance    due to MS   GERD (gastroesophageal reflux disease)    History  of Graves' disease 2017   s/p RAI 03/ 2019;  hyperthyroidism attributed to [place] IV infusion   History of repair of hiatal hernia 12/18/2019   History of syncope    hx recurrent , 2004 normal MRI and echo,  per pt no syncope since 2004   Hypothyroidism, 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 Tedd Sias, per lov note in epic 06-12-2019 released to pcp   Migraines    Multiple sclerosis, relapsing-remitting [facility])    neurologist--- dr r. sater;  dx 2007, takes ADHD med for chronic fatigue   PONV (postoperative nausea and vomiting)    Tremor, essential    Wears glasses      PAST SURGICAL HISTORY: Past Surgical History:  Procedure Laterality Date   67 HOUR PH STUDY N/A 06/24/2019   Procedure: 24 HOUR PH STUDY;  Surgeon: Napoleon Form, MD;  Location: [facility];  Service: Endoscopy;  Laterality: N/A;   BUNIONECTOMY Left 2005   CESAREAN SECTION  01/22/2014   ESOPHAGEAL MANOMETRY N/A 06/24/2019   Procedure: ESOPHAGEAL MANOMETRY (EM);  Surgeon: Napoleon Form, MD;  Location: [facility];  Service: Endoscopy;  Laterality: N/A;   ESOPHAGOGASTRODUODENOSCOPY N/A 12/18/2019   Procedure: ESOPHAGOGASTRODUODENOSCOPY (EGD);  Surgeon: Shellia Cleverly, DO;  Location: [facility];  Service: Gastroenterology;  Laterality: N/A;   FINGER SURGERY Left    Pinky finger   HIATAL HERNIA REPAIR N/A 12/18/2019   Procedure: LAPAROSCOPIC REPAIR OF HIATAL HERNIA;  Surgeon: Gaynelle Adu, MD;  Location: [facility];   Service: General;  Laterality: N/A;   KNEE ARTHROSCOPY W/ ACL RECONSTRUCTION Left 03/2008   @[facility]    OPEN REDUCTION INTERNAL FIXATION (ORIF) PROXIMAL PHALANX Left 12/08/2020   Procedure: OPEN REDUCTION INTERNAL FIXATION (ORIF) PROXIMAL PHALANX;  Surgeon: Gomez Cleverly, MD;  Location[facility];  Service: Orthopedics;  Laterality: Left;  with local   PH IMPEDANCE STUDY N/A 06/24/2019   Procedure: PH IMPEDANCE STUDY;  Surgeon: Napoleon Form, MD;  Location: [facility];  Service: Endoscopy;  Laterality: N/A;   TRANSORAL INCISIONLESS FUNDOPLICATION N/A 12/18/2019   Procedure: TRANSORAL INCISIONLESS FUNDOPLICATION;  Surgeon: Shellia Cleverly, DO;  Location: [facility];  Service: Gastroenterology;  Laterality: N/A;   UPPER GASTROINTESTINAL ENDOSCOPY  05/07/2019     FAMILY HISTORY: Family History  Problem Relation Age of Onset   Pulmonary embolism Mother    Prostate cancer Father    Diabetes Father    Kidney disease Maternal Grandmother    Esophageal cancer Maternal Grandfather    Heart disease Paternal Grandfather    Arthritis Other    Dementia Other    Hypertension Other    Heart disease Other    Colon cancer Neg Hx    Rectal cancer Neg Hx    Stomach cancer Neg Hx    Breast cancer Neg Hx      SOCIAL HISTORY: Social History   Socioeconomic History   Marital status: Married    Spouse name: Not on file   Number of children: 1   Years of education: Not on file   Highest education level: Not on file  Occupational History   Occupation: Tree surgeon  Tobacco Use   Smoking status: Never   Smokeless tobacco: Never  Vaping Use   Vaping Use: Never used  Substance and Sexual Activity   Alcohol use: Not Currently   Drug use: Never   Sexual activity: Not Currently    Birth control/protection: Pill  Other Topics Concern   Not on file  Social History Narrative   Patient lives with parents.   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   Patient is single.    Patient has a Probation officer.    Social Determinants of Health   Financial Resource Strain: Not on file  Food Insecurity: Not on file  Transportation Needs: Not on file  Physical Activity: Not on file  Stress: Not on file  Social Connections: Not on file  Intimate Partner Violence: Not on file     PHYSICAL EXAM  Vitals:   08/09/22 1348  BP: 122/80  Pulse: 89  Weight: 186 lb 8 oz (84.6 kg)  Height: 5\' 2"  (1.575 m)    Body mass index is 34.11 kg/m.  Generalized: Well developed, in no acute distress  Cardiology: normal rate and rhythm, no murmur auscultated  Respiratory: clear to auscultation bilaterally    Neurological examination  Mentation: Alert oriented to time, place, history taking. Follows all commands speech and language fluent Cranial nerve II-XII: Pupils were equal round reactive to light. Extraocular movements were full, visual field were full on confrontational test. Facial sensation and strength were normal. Head turning and shoulder shrug  were normal and symmetric. Motor: The motor testing reveals 5 over 5 strength of all 4 extremities. Good symmetric motor tone is noted throughout.  Sensory: Sensory testing is intact to soft touch on all 4 extremities. No evidence of extinction is noted.  Coordination: Cerebellar testing reveals good finger-nose-finger and heel-to-shin bilaterally.  Gait and station: Gait is normal.  Reflexes: Deep tendon reflexes are symmetric and normal bilaterally.    DIAGNOSTIC DATA (LABS, IMAGING, TESTING) - I reviewed patient records, labs, notes, testing and imaging myself where available.  Lab Results  Component Value Date   WBC 5.0 12/15/2019   HGB 13.1 12/15/2019   HCT 39.2 12/15/2019   MCV 90.3 12/15/2019   PLT 350 12/15/2019      Component Value Date/Time   NA 139 12/15/2019 1438   NA 141 01/05/2019 1455   NA 138 05/09/2012 0830   K 3.5 12/15/2019 1438   K 3.7  05/09/2012 0830   CL 111 12/15/2019 1438   CL 108 (H) 05/09/2012 0830   CO2 16 (L) 12/15/2019 1438   CO2 23 05/09/2012 0830   GLUCOSE 136 (H) 12/15/2019 1438   GLUCOSE 84 05/09/2012 0830   BUN 15 12/15/2019 1438   BUN 16 01/05/2019 1455   BUN 16 05/09/2012 0830   CREATININE 0.83 12/15/2019 1438   CREATININE 0.95 05/09/2012 0830   CALCIUM 9.0 12/15/2019 1438   CALCIUM 8.6 05/09/2012 0830   PROT 6.9 12/15/2019 1438   PROT 6.2 01/05/2019 1455   PROT 6.7 05/09/2012 0830   ALBUMIN 4.1 12/15/2019 1438   ALBUMIN 4.1 01/05/2019 1455   ALBUMIN 3.4 05/09/2012 0830   AST 14 (L) 12/15/2019 1438   AST 17 05/09/2012 0830   ALT 12 12/15/2019 1438   ALT 16 05/09/2012 0830   ALKPHOS 44 12/15/2019 1438   ALKPHOS 55 05/09/2012 0830   BILITOT 0.8 12/15/2019 1438   BILITOT 0.3 01/05/2019 1455   BILITOT 0.7 05/09/2012 0830   GFRNONAA >60 12/15/2019 1438   GFRNONAA >60 05/09/2012 0830   GFRAA 74 01/05/2019 1455   GFRAA >60 05/09/2012 0830   Lab Results  Component Value Date   CHOL 194 11/02/2016   HDL 61 11/02/2016   LDLCALC 106 (H) 11/02/2016   TRIG 137 11/02/2016   CHOLHDL 3.2 11/02/2016   Lab  Results  Component Value Date   HGBA1C 5.1 11/02/2016   No results found for: "VITAMINB12" Lab Results  Component Value Date   TSH 1.260 01/05/2019        No data to display               No data to display           ASSESSMENT AND PLAN  42 y.o. 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 here with    Multiple sclerosis  (HCC) - Plan: MR BRAIN W WO CONTRAST  High risk medication use  Attention deficit disorder, unspecified hyperactivity presence  Common migraine without intractability  Other fatigue   Carrie Nelson is doing well, today. We will repeat MRI brain for monitoring. Labs have been stable with PCP. She will continue Concerta 36-72mg  daily and topiramate 50-100mg  daily. Healthy lifestyle habits encouraged. She will follow up with PCP as directed. She will return to see Dr Epimenio Foot in 6 months, sooner if needed. She verbalizes understanding and agreement with this plan.    Orders Placed This Encounter  Procedures   MR BRAIN W WO CONTRAST    Would like to consider cash pay on truck, has [facility] health insurance and looking for cheapest oop cost.    Standing Status:   Future    Standing Expiration Date:   02/08/2023    Order Specific Question:   If indicated for the ordered procedure, I authorize the administration of contrast media per Radiology protocol    Answer:   Yes    Order Specific Question:   What is the patient's sedation requirement?    Answer:   No Sedation    Order Specific Question:   Does the patient have a pacemaker or implanted devices?    Answer:   No    Order Specific Question:   Radiology Contrast Protocol - do NOT remove file path    Answer:   [email or web address]    Order Specific Question:   Preferred imaging location?    Answer:   Internal     Meds ordered this encounter  Medications   methylphenidate 36 MG PO CR tablet    Sig: Take 1 tablet (36 mg total) by mouth daily. May also take 1 tablet (36 mg total) daily as needed.    Dispense:  60 tablet    Refill:  0    Order Specific Question:   Supervising Provider    Answer:   Anson Fret [[id number]]   topiramate (TOPAMAX) 50 MG tablet    Sig: Take 1-2 tablets (50-100 mg total) by mouth at bedtime.    Dispense:  180 tablet    Refill:  1    Patient will call when ready to  refill    Order Specific Question:   Supervising Provider    Answer:   Anson Fret [[id number]]     Shawnie Dapper, MSN, FNP-C 08/09/2022, 2:22 PM  [facility] [address], [address] [place], [place] [address] [phone]

## 2022-08-14 ENCOUNTER — Telehealth: Payer: Self-pay | Admitting: Family Medicine

## 2022-08-14 NOTE — Telephone Encounter (Signed)
I called the patient to schedule at [facility] and she said that she wants to wait to schedule until October/November and she wants to go to GI because they are cheaper. She is going to call them to get scheduled before her appointment with Dr. Epimenio Foot in December.

## 2022-08-15 ENCOUNTER — Other Ambulatory Visit: Payer: Self-pay

## 2022-08-21 ENCOUNTER — Other Ambulatory Visit: Payer: Self-pay

## 2022-08-28 ENCOUNTER — Other Ambulatory Visit: Payer: Self-pay

## 2022-09-10 ENCOUNTER — Other Ambulatory Visit: Payer: Self-pay

## 2022-09-11 ENCOUNTER — Other Ambulatory Visit: Payer: Self-pay

## 2022-09-11 ENCOUNTER — Encounter: Payer: Self-pay | Admitting: Gastroenterology

## 2022-09-11 NOTE — Telephone Encounter (Signed)
PA team, please initiate PA for Voquezna as requested. Thanks

## 2022-09-14 ENCOUNTER — Other Ambulatory Visit (HOSPITAL_COMMUNITY): Payer: Self-pay

## 2022-09-14 ENCOUNTER — Telehealth: Payer: Self-pay | Admitting: Pharmacy Technician

## 2022-09-14 NOTE — Telephone Encounter (Signed)
Pharmacy Patient Advocate Encounter  Received notification from [facility] that Prior Authorization for VOQUEZNA 10MG  has been APPROVED from 7.15.24 to 9.13.24.Marland Kitchen  PA #/Case ID/Reference #: PA Case ID: [id number]  Spoke with Pharmacy to process. Copay is $120.55   Received notification from CoverMyMeds that prior authorization for VOQUEZNA 10MG  is required/requested.   Insurance verification completed.   The patient is insured through [facility] .   Per test claim: PA submitted to [facility] via CoverMyMeds Key/confirmation #/EOC [id number] Status is pending

## 2022-09-17 ENCOUNTER — Other Ambulatory Visit: Payer: Self-pay

## 2022-09-18 ENCOUNTER — Encounter: Payer: Self-pay | Admitting: Gastroenterology

## 2022-09-18 ENCOUNTER — Ambulatory Visit: Payer: Commercial Managed Care - PPO | Admitting: Gastroenterology

## 2022-09-18 ENCOUNTER — Other Ambulatory Visit: Payer: Self-pay

## 2022-09-18 VITALS — BP 122/74 | HR 86 | Ht 62.0 in | Wt 184.0 lb

## 2022-09-18 DIAGNOSIS — G35 Multiple sclerosis: Secondary | ICD-10-CM | POA: Diagnosis not present

## 2022-09-18 DIAGNOSIS — R09A2 Foreign body sensation, throat: Secondary | ICD-10-CM | POA: Diagnosis not present

## 2022-09-18 DIAGNOSIS — K2100 Gastro-esophageal reflux disease with esophagitis, without bleeding: Secondary | ICD-10-CM | POA: Diagnosis not present

## 2022-09-18 DIAGNOSIS — G35D Multiple sclerosis, unspecified: Secondary | ICD-10-CM

## 2022-09-18 DIAGNOSIS — R0989 Other specified symptoms and signs involving the circulatory and respiratory systems: Secondary | ICD-10-CM

## 2022-09-18 DIAGNOSIS — R053 Chronic cough: Secondary | ICD-10-CM | POA: Diagnosis not present

## 2022-09-18 NOTE — Patient Instructions (Addendum)
Please follow up in 6 months. Give [place] a call at [phone] to schedule an appointment.   We will fax a referral to [facility]. Address: [address], [place], [place]  Phone: [phone] _______________________________________________________  If your blood pressure at your visit was 140/90 or greater, please contact your primary care physician to follow up on this.  _______________________________________________________  If you are age 57 or younger, your body mass index should be between 19-25. Your Body mass index is 33.65 kg/m. If this is out of the aformentioned range listed, please consider follow up with your Primary Care Provider.   __________________________________________________________  The [facility] providers would like to encourage you to use [facility] to communicate with providers for non-urgent requests or questions.  Due to long hold times on the telephone, sending your provider a message by [facility] may be a faster and more efficient way to get a response.  Please allow 48 business hours for a response.  Please remember that this is for non-urgent requests.   Due to recent changes in healthcare laws, you may see the results of your imaging and laboratory studies on MyChart before your provider has had a chance to review them.  We understand that in some cases there may be results that are confusing or concerning to you. Not all laboratory results come back in the same time frame and the provider may be waiting for multiple results in order to interpret others.  Please give [place] 48 hours in order for your provider to thoroughly review all the results before contacting the office for clarification of your results.     Thank you for choosing me and [facility].  Vito Cirigliano, D.O.

## 2022-09-18 NOTE — Progress Notes (Signed)
Chief Complaint:    GERD  GI History: Carrie Nelson is a 42 year old female with a history of multiple sclerosis, hypothyroidism s/p iodine ablation with subsequent hypothyroidism (on Synthroid), with a history of GERD, s/p concomitant laparoscopic hiatal hernia repair and TIF 11/2019 as outlined below.   GERD history: -Index symptoms: Regurgitation, increased throat clearing, globus sensation.  Increased nocturnal throat clearing.  No dysphagia or heartburn -Exacerbating factors: Increased postprandial symptoms -Medications trialed: Nexium -Current medications: None -Complications: LPR, LES laxity with small HH    GERD evaluation: -Last EGD: 05/07/2019.  Images reviewed by me today -Barium esophagram:  09/2019: Normal motility -Esophageal Manometry: Normal, no HH -pH/Impedance (on PPI): Good acid control with DeMeester 2.2, increased reflux events with increased esophageal bolus exposure with predominantly weakly acid reflux (95 events with 92 being weakly acid) -recommended consideration of antireflux surgery -Laryngoscopy by ENT in [place] in 01/2019 with e/o reflux irritation.   Endoscopic History: -EGD (04/27/2019, Dr. 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, <1 cm axial HH, appears 2.5-3 cm in transverse width -Laparoscopic HH repair and concomitant TIF (12/18/2019): Successful TIF with 24 fasteners placed - 06/20/2022: EGD: Mild inflammation at GE junction.  TIF in place with good length but apparent wrap laxity under full air insufflation and valve agitation.  Otherwise normal stomach.  Bravo placed. - 06/20/2022: Bravo (off PPI): Increased esophageal acid exposure with % time pH <4 of 16.9%.  DeMeester score of 66.  Esophageal acid exposure abnormal in upright and supine position.  No symptom correlation for dysphagia based on SAP.  HPI:     Patient is a 42 y.o. female presenting to the Gastroenterology Clinic for follow-up.  Was last seen by me on  05/16/2022.  Was having globus sensation and increased throat clearing with occasional cough, prompting further evaluation with repeat EGD with Bravo placement (off PPI) which was notable for elevated DeMeester score and abnormal esophageal acid exposure.  She was started on Voquenza 20 mg daily x 8 weeks, with plan to then reduce to 10 mg daily.  Reflux symptoms improved (although not completely resolved) with Voquenza, but still has breakthrough at times (throat clearing, globus sensation). Overall 50% improvement. Lapse in coverage from 20--> 10 mg and hoping it is available tomorrow, so she has been without Rx in 2 weeks.   Follows with Neurology for MS, last seen 08/09/2022 and scheduled for MRI brain this year.  Review of systems:     No chest pain, no SOB, no fevers, no urinary sx   Past Medical History:  Diagnosis Date   Allergic asthma, mild intermittent, uncomplicated    per pt no rescue inhaler because she is under control (foilowed by pcp)   Allergy    Anxiety    Complication of anesthesia    Finger fracture, left    5th phalanx   Gait disturbance    due to MS   GERD (gastroesophageal reflux disease)    History of Graves' disease 2017   s/p RAI 03/ 2019;  hyperthyroidism attributed to [place] IV infusion   History of repair of hiatal hernia 12/18/2019   History of syncope    hx recurrent , 2004 normal MRI and echo,  per pt no syncope since 2004   Hypothyroidism, 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 Tedd Sias, per lov note in epic 06-12-2019 released to pcp   Migraines    Multiple sclerosis, relapsing-remitting [facility])    neurologist--- dr r. sater;  dx 2007, takes  ADHD med for chronic fatigue   PONV (postoperative nausea and vomiting)    Tremor, essential    Wears glasses     Patient's surgical history, family medical history, social history, medications and allergies were all reviewed in Epic    Current Outpatient Medications  Medication Sig Dispense Refill    ALPRAZolam (XANAX) 0.5 MG tablet Take 1 tablet (0.5 mg total) by mouth at bedtime as needed for sleep. 90 tablet 1   FLUoxetine (PROZAC) 10 MG tablet Take 1 tablet by mouth once daily 90 tablet 3   levothyroxine (SYNTHROID) 150 MCG tablet Take 1 tablet (150 mcg total) by mouth once daily Take on an empty stomach with a glass of water at least 30-60 minutes before breakfast. 90 tablet 3   loratadine (CLARITIN) 10 MG tablet Take 10 mg by mouth at bedtime.     methylphenidate 36 MG PO CR tablet Take 1 tablet (36 mg total) by mouth daily. May also take 1 tablet (36 mg total) daily as needed. 60 tablet 0   Norethindrone Acetate-Ethinyl Estradiol (LOESTRIN) 1.5-30 MG-MCG tablet Take 1 tablet by mouth daily. 63 tablet 5   saccharomyces boulardii (FLORASTOR) 250 MG capsule Take 250 mg by mouth daily.     topiramate (TOPAMAX) 50 MG tablet Take 1-2 tablets (50-100 mg total) by mouth at bedtime. 180 tablet 1   Vonoprazan Fumarate (VOQUEZNA) 10 MG TABS Take 1 tablet by mouth daily. 90 tablet 3   No current facility-administered medications for this visit.    Physical Exam:     BP 122/74   Pulse 86   Ht 5\' 2"  (1.575 m)   Wt 184 lb (83.5 kg)   BMI 33.65 kg/m   GENERAL:  Pleasant female in NAD PSYCH: : Cooperative, normal affect NEURO: Alert and oriented x 3   IMPRESSION and PLAN:    1) GERD 2) Increased throat clearing 3) Globus sensation Repeat EGD with Bravo in 05/2022 was notable for largely intact fundoplication, but there was a small area of laxity uncovered with full air insufflation and valve agitation.  Bravo (off PPI) then confirmed significant esophageal acid exposure with DeMeester score 66.  While this is all consistent with breakthrough reflux, she is still curious about potential overlapping syndromes as the reason for her continued extra esophageal manifestations (increased throat clearing, globus sensation).  Did have 50% improvement with trial of Voquenza.  Plan for the  following:  - Continue Voquenza at 10 mg daily.  She will message me in the next couple of weeks to let me know how she is doing with the lower dose.  If increasing symptoms, will go back to 20 mg daily - Discussed the role/utility of repeat pH testing on acid suppression therapy to evaluate for grade/severity of continued esophageal acid exposure on therapy.  She feels this would be a largely academic query and not likely to add additional clinical benefit, and I cannot necessarily disagree - Will refer to ENT for additional evaluation of throat clearing, globus sensation, etc. with laryngoscopy as appropriate - We did discuss data reporting up to 38% of MS patients with various forms of swallowing difficulties, to include chronic cough, chronic throat clearing, globus sensation.  Again, she clearly has breakthrough esophageal acid exposure as manifested on the Bravo study, but this is at least curious as to why she continues to have refractory symptoms despite multiple acid suppression therapies  4) Multiple sclerosis - To obtain MRI brain for MS surveillance as ordered by Neurology  already - Continue follow-up in the Neurology Clinic   RTC in 6 months or sooner prn  I spent 35 minutes of time, including in depth chart review, independent review of results as outlined above, communicating results with the patient directly, face-to-face time with the patient, coordinating care, and ordering studies and medications as appropriate, and documentation.           Shellia Cleverly ,DO, FACG 09/18/2022, 2:08 PM

## 2022-09-19 ENCOUNTER — Other Ambulatory Visit: Payer: Self-pay

## 2022-09-19 ENCOUNTER — Telehealth: Payer: Self-pay

## 2022-09-19 NOTE — Telephone Encounter (Signed)
Faxed ENT referral to [facility] at [facility]. Ph# [phone] Fax# [phone]

## 2022-09-21 ENCOUNTER — Other Ambulatory Visit: Payer: Self-pay

## 2022-10-21 ENCOUNTER — Other Ambulatory Visit: Payer: Self-pay

## 2022-10-22 ENCOUNTER — Other Ambulatory Visit: Payer: Self-pay

## 2022-10-23 ENCOUNTER — Other Ambulatory Visit: Payer: Self-pay

## 2022-11-02 ENCOUNTER — Other Ambulatory Visit: Payer: Self-pay

## 2022-11-12 ENCOUNTER — Other Ambulatory Visit: Payer: Self-pay

## 2022-11-15 DIAGNOSIS — E7849 Other hyperlipidemia: Secondary | ICD-10-CM | POA: Diagnosis not present

## 2022-11-15 DIAGNOSIS — Z8639 Personal history of other endocrine, nutritional and metabolic disease: Secondary | ICD-10-CM | POA: Diagnosis not present

## 2022-11-15 DIAGNOSIS — G35 Multiple sclerosis: Secondary | ICD-10-CM | POA: Diagnosis not present

## 2022-11-16 ENCOUNTER — Other Ambulatory Visit: Payer: Self-pay

## 2022-11-21 ENCOUNTER — Other Ambulatory Visit: Payer: Self-pay

## 2022-11-22 ENCOUNTER — Other Ambulatory Visit: Payer: Self-pay

## 2022-11-22 DIAGNOSIS — F419 Anxiety disorder, unspecified: Secondary | ICD-10-CM | POA: Diagnosis not present

## 2022-11-22 DIAGNOSIS — Z0000 Encounter for general adult medical examination without abnormal findings: Secondary | ICD-10-CM | POA: Diagnosis not present

## 2022-11-22 DIAGNOSIS — Z8639 Personal history of other endocrine, nutritional and metabolic disease: Secondary | ICD-10-CM | POA: Diagnosis not present

## 2022-11-22 DIAGNOSIS — J4520 Mild intermittent asthma, uncomplicated: Secondary | ICD-10-CM | POA: Diagnosis not present

## 2022-11-22 DIAGNOSIS — E7849 Other hyperlipidemia: Secondary | ICD-10-CM | POA: Diagnosis not present

## 2022-11-22 DIAGNOSIS — G35 Multiple sclerosis: Secondary | ICD-10-CM | POA: Diagnosis not present

## 2022-11-22 DIAGNOSIS — E039 Hypothyroidism, unspecified: Secondary | ICD-10-CM | POA: Diagnosis not present

## 2022-11-22 MED ORDER — LEVOTHYROXINE SODIUM 137 MCG PO TABS
137.0000 ug | ORAL_TABLET | Freq: Every day | ORAL | 11 refills | Status: DC
  Filled 2022-11-22: qty 30, 30d supply, fill #0
  Filled 2022-12-24: qty 30, 30d supply, fill #1
  Filled 2023-01-21: qty 30, 30d supply, fill #2
  Filled 2023-02-28: qty 30, 30d supply, fill #3
  Filled 2023-03-27: qty 30, 30d supply, fill #4
  Filled 2023-04-26: qty 30, 30d supply, fill #5
  Filled 2023-05-27: qty 30, 30d supply, fill #6
  Filled 2023-07-01: qty 30, 30d supply, fill #7
  Filled 2023-08-02: qty 30, 30d supply, fill #8
  Filled 2023-09-05: qty 30, 30d supply, fill #9
  Filled 2023-10-08: qty 30, 30d supply, fill #10
  Filled 2023-11-11: qty 30, 30d supply, fill #11

## 2022-11-29 ENCOUNTER — Other Ambulatory Visit: Payer: Self-pay

## 2022-12-04 ENCOUNTER — Telehealth: Payer: Self-pay | Admitting: Pharmacy Technician

## 2022-12-04 ENCOUNTER — Other Ambulatory Visit (HOSPITAL_COMMUNITY): Payer: Self-pay

## 2022-12-04 NOTE — Telephone Encounter (Signed)
Pharmacy Patient Advocate Encounter  Received notification from [facility] that Prior Authorization for VOQUEZNA 10MG  has been APPROVED from 10.8.24 to 3.25.25. Ran test claim, Copay is $120.55. This test claim was processed through [facility]- copay amounts may vary at other pharmacies due to pharmacy/plan contracts, or as the patient moves through the different stages of their insurance plan.   PA #/Case ID/Reference #: [id number]

## 2022-12-04 NOTE — Telephone Encounter (Signed)
Pharmacy Patient Advocate Encounter   Received notification from CoverMyMeds that prior authorization for VOQUEZNA 10MG  is required/requested.   Insurance verification completed.   The patient is insured through [facility] .   Per test claim: PA required; PA submitted to [facility] via CoverMyMeds Key/confirmation #/EOC [id number] Status is pending

## 2022-12-19 ENCOUNTER — Other Ambulatory Visit: Payer: Self-pay

## 2022-12-19 MED ORDER — ALPRAZOLAM 0.5 MG PO TABS
0.5000 mg | ORAL_TABLET | Freq: Every evening | ORAL | 1 refills | Status: DC | PRN
  Filled 2022-12-19: qty 90, 90d supply, fill #0
  Filled 2023-03-18: qty 90, 90d supply, fill #1

## 2022-12-20 DIAGNOSIS — E039 Hypothyroidism, unspecified: Secondary | ICD-10-CM | POA: Diagnosis not present

## 2022-12-24 ENCOUNTER — Other Ambulatory Visit: Payer: Self-pay

## 2022-12-24 ENCOUNTER — Other Ambulatory Visit: Payer: Self-pay | Admitting: Obstetrics and Gynecology

## 2022-12-24 DIAGNOSIS — Z01419 Encounter for gynecological examination (general) (routine) without abnormal findings: Secondary | ICD-10-CM

## 2022-12-26 ENCOUNTER — Other Ambulatory Visit: Payer: Self-pay | Admitting: Obstetrics and Gynecology

## 2022-12-26 DIAGNOSIS — Z01419 Encounter for gynecological examination (general) (routine) without abnormal findings: Secondary | ICD-10-CM

## 2022-12-26 NOTE — Telephone Encounter (Signed)
Message sent to pt to schedule annual prior to refill.

## 2022-12-27 ENCOUNTER — Other Ambulatory Visit: Payer: Self-pay

## 2022-12-27 MED ORDER — NORETHINDRONE ACET-ETHINYL EST 1.5-30 MG-MCG PO TABS
1.0000 | ORAL_TABLET | Freq: Every day | ORAL | 5 refills | Status: DC
Start: 2022-12-27 — End: 2022-12-27
  Filled 2022-12-27: qty 63, 63d supply, fill #0

## 2022-12-27 NOTE — Telephone Encounter (Signed)
Pt aware via vm that rx has been sent in; to call to be sure it's ready before she makes the trip to p/u.

## 2022-12-28 ENCOUNTER — Other Ambulatory Visit: Payer: Self-pay

## 2022-12-28 ENCOUNTER — Ambulatory Visit (INDEPENDENT_AMBULATORY_CARE_PROVIDER_SITE_OTHER): Payer: Commercial Managed Care - PPO | Admitting: Obstetrics and Gynecology

## 2022-12-28 ENCOUNTER — Encounter: Payer: Self-pay | Admitting: Obstetrics and Gynecology

## 2022-12-28 ENCOUNTER — Other Ambulatory Visit (HOSPITAL_COMMUNITY)
Admission: RE | Admit: 2022-12-28 | Discharge: 2022-12-28 | Disposition: A | Discharge status: Home / Self Care | Payer: Commercial Managed Care - PPO | Source: Ambulatory Visit | Attending: Obstetrics and Gynecology | Admitting: Obstetrics and Gynecology

## 2022-12-28 VITALS — BP 118/81 | HR 87 | Ht 62.0 in | Wt 185.1 lb

## 2022-12-28 DIAGNOSIS — Z124 Encounter for screening for malignant neoplasm of cervix: Secondary | ICD-10-CM

## 2022-12-28 DIAGNOSIS — R399 Unspecified symptoms and signs involving the genitourinary system: Secondary | ICD-10-CM | POA: Diagnosis not present

## 2022-12-28 DIAGNOSIS — Z01411 Encounter for gynecological examination (general) (routine) with abnormal findings: Secondary | ICD-10-CM

## 2022-12-28 DIAGNOSIS — Z01419 Encounter for gynecological examination (general) (routine) without abnormal findings: Secondary | ICD-10-CM | POA: Insufficient documentation

## 2022-12-28 DIAGNOSIS — R300 Dysuria: Secondary | ICD-10-CM | POA: Diagnosis not present

## 2022-12-28 DIAGNOSIS — Z1231 Encounter for screening mammogram for malignant neoplasm of breast: Secondary | ICD-10-CM

## 2022-12-28 LAB — POCT URINALYSIS DIPSTICK
Bilirubin, UA: NEGATIVE
Glucose, UA: NEGATIVE
Ketones, UA: NEGATIVE
Nitrite, UA: NEGATIVE
Protein, UA: POSITIVE — AB
Spec Grav, UA: 1.015 (ref 1.010–1.025)
Urobilinogen, UA: 0.2 U/dL
pH, UA: 6.5 (ref 5.0–8.0)

## 2022-12-28 MED ORDER — NORETHINDRONE ACET-ETHINYL EST 1.5-30 MG-MCG PO TABS
1.0000 | ORAL_TABLET | Freq: Every day | ORAL | 5 refills | Status: DC
  Filled 2022-12-28 – 2023-02-28 (×2): qty 63, 63d supply, fill #0
  Filled 2023-04-29: qty 63, 63d supply, fill #1
  Filled 2023-06-26: qty 84, 84d supply, fill #2
  Filled 2023-06-26: qty 63, 63d supply, fill #2
  Filled 2023-09-22: qty 84, 84d supply, fill #3
  Filled 2023-12-13: qty 84, 84d supply, fill #0

## 2022-12-28 MED ORDER — NITROFURANTOIN MONOHYD MACRO 100 MG PO CAPS
100.0000 mg | ORAL_CAPSULE | Freq: Two times a day (BID) | ORAL | 0 refills | Status: DC
  Filled 2022-12-28: qty 14, 7d supply, fill #0

## 2022-12-28 NOTE — Progress Notes (Signed)
Patients presents for annual exam today. She states she may have a yeast infection, states dysuria as her main complaint. Due for pap smear, ordered. Mammogram, up to date. Annual labs are up to date by PCP. She states no other questions or concerns at this time.

## 2022-12-28 NOTE — Progress Notes (Signed)
HPI:      Ms. Carrie Nelson is a 42 y.o. G1P1001 who LMP was No LMP recorded. (Menstrual status: Oral contraceptives).  Subjective:   She presents today for her annual examination.  She reports that she has had some burning and stinging with urination for approximately 1 week.  She says she does not know if it is a bladder infection or a yeast infection.  She states that she gets urinary tract infections on a somewhat regular basis.  She denies vaginal discharge denies itching. She is not currently sexually active. She is taking OCPs as directed but most of the time does not have menses during her off week. She reports that her cholesterol was recently found to be elevated but she is following this with Dr. Graciela Husbands.    Hx: The following portions of the patient's history were reviewed and updated as appropriate:             She  has a past medical history of Allergic asthma, mild intermittent, uncomplicated, Allergy, Anxiety, Complication of anesthesia, Finger fracture, left, Gait disturbance, GERD (gastroesophageal reflux disease), History of Graves' disease (2017), History of repair of hiatal hernia (12/18/2019), History of syncope, Hypothyroidism, postablative (04/2017), Migraines, Multiple sclerosis, relapsing-remitting (HCC), PONV (postoperative nausea and vomiting), Tremor, essential, and Wears glasses. She does not have any pertinent problems on file. She  has a past surgical history that includes Knee arthroscopy w/ ACL reconstruction (Left, 03/2008); Bunionectomy (Left, 2005); Cesarean section (01/22/2014); Upper gastrointestinal endoscopy (05/07/2019); Esophageal manometry (N/A, 06/24/2019); 24 hour ph study (N/A, 06/24/2019); PH impedance study (N/A, 06/24/2019); Hiatal hernia repair (N/A, 12/18/2019); Transoral incisionless fundoplication (N/A, 12/18/2019); Esophagogastroduodenoscopy (N/A, 12/18/2019); Open reduction internal fixation (orif) proximal phalanx (Left, 12/08/2020); and Finger  surgery (Left). Her family history includes Arthritis in an other family member; Dementia in an other family member; Diabetes in her father; Esophageal cancer in her maternal grandfather; Heart disease in her paternal grandfather and another family member; Hypertension in an other family member; Kidney disease in her maternal grandmother; Prostate cancer in her father; Pulmonary embolism in her mother. She  reports that she has never smoked. She has never used smokeless tobacco. She reports that she does not currently use alcohol. She reports that she does not use drugs. She has a current medication list which includes the following prescription(s): alprazolam, fluoxetine, levothyroxine, loratadine, methylphenidate, nitrofurantoin (macrocrystal-monohydrate), norethindrone acetate-ethinyl estradiol, saccharomyces boulardii, topiramate, and voquezna. She is allergic to scopolamine.       Review of Systems:  Review of Systems  Constitutional: Denied constitutional symptoms, night sweats, recent illness, fatigue, fever, insomnia and weight loss.  Eyes: Denied eye symptoms, eye pain, photophobia, vision change and visual disturbance.  Ears/Nose/Throat/Neck: Denied ear, nose, throat or neck symptoms, hearing loss, nasal discharge, sinus congestion and sore throat.  Cardiovascular: Denied cardiovascular symptoms, arrhythmia, chest pain/pressure, edema, exercise intolerance, orthopnea and palpitations.  Respiratory: Denied pulmonary symptoms, asthma, pleuritic pain, productive sputum, cough, dyspnea and wheezing.  Gastrointestinal: Denied, gastro-esophageal reflux, melena, nausea and vomiting.  Genitourinary: See HPI for additional information.  Musculoskeletal: Denied musculoskeletal symptoms, stiffness, swelling, muscle weakness and myalgia.  Dermatologic: Denied dermatology symptoms, rash and scar.  Neurologic: Denied neurology symptoms, dizziness, headache, neck pain and syncope.  Psychiatric: Denied  psychiatric symptoms, anxiety and depression.  Endocrine: Denied endocrine symptoms including hot flashes and night sweats.   Meds:   Current Outpatient Medications on File Prior to Visit  Medication Sig Dispense Refill   ALPRAZolam (XANAX) 0.5 MG tablet Take  1 tablet (0.5 mg total) by mouth at bedtime as needed. 90 tablet 1   FLUoxetine (PROZAC) 10 MG tablet Take 1 tablet by mouth once daily 90 tablet 3   levothyroxine (SYNTHROID) 137 MCG tablet Take 1 tablet (137 mcg total) by mouth daily. Take on an empty stomach with a glass of water at least 30-60 minutes before breakfast. 30 tablet 11   loratadine (CLARITIN) 10 MG tablet Take 10 mg by mouth at bedtime.     methylphenidate 36 MG PO CR tablet Take 1 tablet (36 mg total) by mouth daily. May also take 1 tablet (36 mg total) daily as needed. 60 tablet 0   saccharomyces boulardii (FLORASTOR) 250 MG capsule Take 250 mg by mouth daily.     topiramate (TOPAMAX) 50 MG tablet Take 1-2 tablets (50-100 mg total) by mouth at bedtime. 180 tablet 1   Vonoprazan Fumarate (VOQUEZNA) 10 MG TABS Take 1 tablet by mouth daily. 90 tablet 3   No current facility-administered medications on file prior to visit.     Objective:     Vitals:   12/28/22 0819  BP: 118/81  Pulse: 87    Filed Weights   12/28/22 0819  Weight: 185 lb 1.6 oz (84 kg)              Physical examination General NAD, Conversant  HEENT Atraumatic; Op clear with mmm.  Normo-cephalic.  Anicteric sclerae  Thyroid/Neck Smooth without nodularity or enlargement. Normal ROM.  Neck Supple.  Skin No rashes, lesions or ulceration. Normal palpated skin turgor. No nodularity.  Breasts: No masses or discharge.  Symmetric.  No axillary adenopathy.  Lungs: Clear to auscultation.No rales or wheezes. Normal Respiratory effort, no retractions.  Heart: NSR.  No murmurs or rubs appreciated. No peripheral edema  Abdomen: Soft.  Non-tender.  No masses.  No HSM. No hernia  Extremities: Moves all  appropriately.  Normal ROM for age. No lymphadenopathy.  Neuro: Oriented to PPT.  Normal mood. Normal affect.     Pelvic:   Vulva: Normal appearance.  No lesions.  Vagina: No lesions or abnormalities noted.  Support: Normal pelvic support.  Urethra No masses tenderness or scarring.  Meatus Normal size without lesions or prolapse.  Cervix: Normal appearance.  No lesions.  Anus: Normal exam.  No lesions.  Perineum: Normal exam.  No lesions.        Bimanual   Uterus: Normal size.  Non-tender.  Mobile.  AV.  Adnexae: No masses.  Non-tender to palpation.  Cul-de-sac: Negative for abnormality.   UA reveals small amount of blood and moderate leukocytes.  Assessment:    G1P1001 Patient Active Problem List   Diagnosis Date Noted   Hiatal hernia    Gastroesophageal reflux disease    Globus sensation    Common migraine without intractability 07/02/2018   Seasonal allergies 09/19/2017   Hyperthyroidism 06/24/2017   Chronic venous insufficiency 05/10/2016   Varicose veins of both lower extremities with inflammation 04/02/2016   Pain in limb 04/02/2016   Type 2 diabetes mellitus (HCC) 04/02/2016   High risk medication use 10/18/2015   Constipation, chronic 08/18/2015   Asthma, exogenous 01/31/2015   Anxiety 01/31/2015   History of knee problem 01/31/2015   H/O thyroid disease 01/31/2015   Allergic rhinitis, seasonal 01/31/2015   Gait disturbance 07/08/2014   Other fatigue 07/08/2014   Attention deficit disorder 07/08/2014   Nonspecific abnormal results of other specified function study 09/06/2012   Dysarthria 09/06/2012   Knee Pain, Left  09/06/2012   Multiple sclerosis (HCC) 09/06/2012   Female genuine stress incontinence 11/06/2011   FOM (frequency of micturition) 11/06/2011   Chronic cystitis 11/06/2011   Gross hematuria 11/06/2011     1. Well woman exam with routine gynecological exam   2. Cervical cancer screening   3. Dysuria   4. UTI symptoms   5. Screening  mammogram for breast cancer     Symptoms and UA likely consistent with UTI.  Doubt yeast at this time.  OCPs without problem.   Plan:            1.  Basic Screening Recommendations The basic screening recommendations for asymptomatic women were discussed with the patient during her visit.  The age-appropriate recommendations were discussed with her and the rational for the tests reviewed.  When I am informed by the patient that another primary care physician has previously obtained the age-appropriate tests and they are up-to-date, only outstanding tests are ordered and referrals given as necessary.  Abnormal results of tests will be discussed with her when all of her results are completed.  Routine preventative health maintenance measures emphasized: Exercise/Diet/Weight control, Tobacco Warnings, Alcohol/Substance use risks and Stress Management Pap performed-mammogram in the spring. 2.  Macrobid for UTI.  Orders Orders Placed This Encounter  Procedures   Urine Culture   MM DIGITAL SCREENING BILATERAL   POCT Urinalysis Dipstick     Meds ordered this encounter  Medications   Norethindrone Acetate-Ethinyl Estradiol (LOESTRIN) 1.5-30 MG-MCG tablet    Sig: Take 1 tablet by mouth daily.    Dispense:  63 tablet    Refill:  5   nitrofurantoin, macrocrystal-monohydrate, (MACROBID) 100 MG capsule    Sig: Take 1 capsule (100 mg total) by mouth 2 (two) times daily.    Dispense:  14 capsule    Refill:  0            F/U  Return in about 1 year (around 12/28/2023) for Annual Physical.  Elonda Husky, M.D. 12/28/2022 9:03 AM

## 2023-01-01 LAB — URINE CULTURE

## 2023-01-02 LAB — CYTOLOGY - PAP
Comment: NEGATIVE
Diagnosis: NEGATIVE
High risk HPV: NEGATIVE

## 2023-01-04 ENCOUNTER — Ambulatory Visit
Admission: RE | Admit: 2023-01-04 | Discharge: 2023-01-04 | Disposition: A | Discharge status: Home / Self Care | Payer: Commercial Managed Care - PPO | Source: Ambulatory Visit | Attending: Family Medicine | Admitting: Family Medicine

## 2023-01-04 DIAGNOSIS — G35 Multiple sclerosis: Secondary | ICD-10-CM

## 2023-01-04 MED ORDER — GADOPICLENOL 0.5 MMOL/ML IV SOLN
10.0000 mL | Freq: Once | INTRAVENOUS | Status: AC | PRN
  Administered 2023-01-04: 8 mL via INTRAVENOUS

## 2023-01-14 ENCOUNTER — Other Ambulatory Visit: Payer: Self-pay

## 2023-01-15 ENCOUNTER — Other Ambulatory Visit: Payer: Self-pay

## 2023-01-18 ENCOUNTER — Other Ambulatory Visit: Payer: Self-pay

## 2023-01-21 ENCOUNTER — Other Ambulatory Visit: Payer: Self-pay

## 2023-01-21 ENCOUNTER — Other Ambulatory Visit: Payer: Self-pay | Admitting: Neurology

## 2023-01-22 ENCOUNTER — Other Ambulatory Visit: Payer: Self-pay

## 2023-01-22 ENCOUNTER — Other Ambulatory Visit: Payer: Self-pay | Admitting: Anesthesiology

## 2023-01-23 ENCOUNTER — Other Ambulatory Visit: Payer: Self-pay

## 2023-01-23 MED FILL — Methylphenidate HCl Tab ER Osmotic Release (OSM) 36 MG: ORAL | 30 days supply | Qty: 60 | Fill #0 | Status: AC

## 2023-02-13 ENCOUNTER — Ambulatory Visit: Payer: Commercial Managed Care - PPO | Admitting: Neurology

## 2023-02-13 ENCOUNTER — Encounter: Payer: Self-pay | Admitting: Neurology

## 2023-02-13 VITALS — BP 131/82 | HR 75 | Ht 62.0 in | Wt 175.8 lb

## 2023-02-13 DIAGNOSIS — G35 Multiple sclerosis: Secondary | ICD-10-CM

## 2023-02-13 DIAGNOSIS — Z79899 Other long term (current) drug therapy: Secondary | ICD-10-CM | POA: Diagnosis not present

## 2023-02-13 DIAGNOSIS — R269 Unspecified abnormalities of gait and mobility: Secondary | ICD-10-CM

## 2023-02-13 DIAGNOSIS — G43009 Migraine without aura, not intractable, without status migrainosus: Secondary | ICD-10-CM

## 2023-02-13 DIAGNOSIS — R5383 Other fatigue: Secondary | ICD-10-CM

## 2023-02-13 NOTE — Progress Notes (Signed)
[facility]  PATIENT: Carrie Nelson DOB: 10/03/1980  REFERRING CLINICIAN: Daniel Nones HISTORY FROM: Patient    REASON FOR VISIT: Multiple Sclerosis   HISTORICAL  CHIEF COMPLAINT:  Chief Complaint  Patient presents with   Follow-up    Pt in room 11 alone. Here for MS follow up.  Pt reports doing well, pt asked if sleep study need to be done due to getting older.                                                f                                                                    HISTORY OF PRESENT ILLNESS:  [name] is a 42 y.o. woman who was diagnosed with relapsing remitting MS in 2007.    Update 02/13/2023: She feels her MS has been stable.    She denies any MS exacerbation or new symptom.  She did [place] in November 2017 and November 2018.    She has completed the 4 years of REMS.  She had hyperthyroidism and she had radioactive iodine ablation.   She has hyothyroidism now and is on synthroid.     Gait is doing well.  She has no falls.   She does not need the bannister on stairs.     She has no weakness in the arms.  No limb numbness and facial numbness has resolved.   She has had frequent UTI's.She feels she does not empty but does not have hesitation.   She is taking a probiotic and feels she may be doing better this month.  Vision is doing well  She is sleeping well on xanax (low dose prescribed by PCP).   She has MS related ADD and fatigue.  Concerta has helped.(Takes 36 mg most days and 72 mg occasional days.     Mood is doing well on fluoxetine.    She has had weight fluctuations.  She has gained some weight (175 pound now was 144 pounds 12/2019).     She snores but no definite OSA sign.  She does not have excessive daytime sleepiness  Migraines have done very well on topiramate.           MS History:    She was diagnosed in 2007 but,  in retrospect, had right foot dragging that started around 2005. Then shortly after that she had  an episode of left facial numbness. In 2007 she had an episode of slurred speech and worsening clumsiness.  MRI of the brain that was consistent with multiple sclerosis. She also had a lumbar puncture and her CSF compatible with the diagnosis of MS. Most of her symptoms improved over the next few months but she did not get 100% back to baseline and she continues to have minimal residual weakness, clumsiness and numbness. She was initially placed on Betaseron but switched to Tysabri because of some generalized side effects of Betaseron. She was seeing Dr. Tinnie Gens 2007 until 2014.   She was on Tysabri between 2008 and late 2014 when she  stopped because of a desire to get pregnant. She became pregnant in early 2015 and delivered in November 2015. She had not been on any disease modifying therapy since late 2014 and then started Tecfidera 02/2014.Marland Kitchen   She did not tolerate it well and started [facility] 12/2015.    Imaging: MRI of the brain 11/02/2015 shows T2/FLAIR hyperintense foci in the pons and the periventricular, juxtacortical and deep white matter of both hemispheres in a pattern and configuration consistent with chronic demyelinating plaque associated with multiple sclerosis. None of the foci enhanced after contrast administration. Compared to the MRI dated 07/23/2014, there is no interval change.  MRI of the cervical spine 01/29/2013 showed a normal spinal cord and no significant degenerative changes.  MRI 01/13/2020 showed multiple T2/FLAIR hyperintense foci in the hemispheres, left thalamus and left pons in a pattern and configuration consistent with chronic demyelinating plaque associated with multiple sclerosis.  None of the foci enhance or appear to be acute.  Compared to the MRI from 07/23/2014, there are no new lesions.  MRI 01/04/2023 showed Multiple T2/FLAIR hyperintense foci in the cerebral hemispheres and foci in the left middle cerebellar peduncle and left pons in a pattern consistent with chronic  demyelinating plaque associated with multiple sclerosis. None of the foci enhanced or appear to be acute. Compared to the MRI from 2021, there were no new lesions.   REVIEW OF SYSTEMS:  Constitutional: No fevers, chills, sweats, or change in appetite Eyes: No visual changes, double vision, eye pain Ear, nose and throat: No hearing loss, ear pain, nasal congestion, sore throat Cardiovascular: No chest pain, palpitations Respiratory:  No shortness of breath at rest or with exertion.   No wheezes GastrointestinaI: No nausea, vomiting, diarrhea, abdominal pain, fecal incontinence Genitourinary:  No dysuria, urinary retention or frequency.  No nocturia. Musculoskeletal:  No neck pain, back pain Integumentary: No rash, pruritus, skin lesions Neurological: as above Psychiatric: No depression at this time.  No anxiety.   Occasionally she has irritability. Endocrine: She she is borderline diabetic. No palpitations, diaphoresis, change in appetite, change in weigh or increased thirst Hematologic/Lymphatic:  No anemia, purpura, petechiae. Allergic/Immunologic: She has seasonal allergies and is treated with several medications.  ALLERGIES: Allergies  Allergen Reactions   Scopolamine Other (See Comments)    Per pt caused vision problem    HOME MEDICATIONS: Outpatient Medications Prior to Visit  Medication Sig Dispense Refill   ALPRAZolam (XANAX) 0.5 MG tablet Take 1 tablet (0.5 mg total) by mouth at bedtime as needed. 90 tablet 1   FLUoxetine (PROZAC) 10 MG tablet Take 1 tablet by mouth once daily 90 tablet 3   levothyroxine (SYNTHROID) 137 MCG tablet Take 1 tablet (137 mcg total) by mouth daily. Take on an empty stomach with a glass of water at least 30-60 minutes before breakfast. 30 tablet 11   loratadine (CLARITIN) 10 MG tablet Take 10 mg by mouth at bedtime.     methylphenidate 36 MG PO CR tablet Take 1 tablet (36 mg total) by mouth daily. May also take 1 tablet (36 mg total) daily as needed.  60 tablet 0   Norethindrone Acetate-Ethinyl Estradiol (LOESTRIN) 1.5-30 MG-MCG tablet Take 1 tablet by mouth daily. 63 tablet 5   saccharomyces boulardii (FLORASTOR) 250 MG capsule Take 250 mg by mouth daily.     topiramate (TOPAMAX) 50 MG tablet Take 1-2 tablets (50-100 mg total) by mouth at bedtime. 180 tablet 1   Vonoprazan Fumarate (VOQUEZNA) 10 MG TABS Take 1 tablet by  mouth daily. 90 tablet 3   nitrofurantoin, macrocrystal-monohydrate, (MACROBID) 100 MG capsule Take 1 capsule (100 mg total) by mouth 2 (two) times daily. (Patient not taking: Reported on 02/13/2023) 14 capsule 0   No facility-administered medications prior to visit.    PAST MEDICAL HISTORY: Past Medical History:  Diagnosis Date   Allergic asthma, mild intermittent, uncomplicated    per pt no rescue inhaler because she is under control (foilowed by pcp)   Allergy    Anxiety    Complication of anesthesia    Finger fracture, left    5th phalanx   Gait disturbance    due to MS   GERD (gastroesophageal reflux disease)    History of Graves' disease 2017   s/p RAI 03/ 2019;  hyperthyroidism attributed to [place] IV infusion   History of repair of hiatal hernia 12/18/2019   History of syncope    hx recurrent , 2004 normal MRI and echo,  per pt no syncope since 2004   Hypothyroidism, 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 Tedd Sias, per lov note in epic 06-12-2019 released to pcp   Migraines    Multiple sclerosis, relapsing-remitting [facility])    neurologist--- dr r. Delquan Poucher;  dx 2007, takes ADHD med for chronic fatigue   PONV (postoperative nausea and vomiting)    Tremor, essential    Wears glasses     PAST SURGICAL HISTORY: Past Surgical History:  Procedure Laterality Date   27 HOUR PH STUDY N/A 06/24/2019   Procedure: 24 HOUR PH STUDY;  Surgeon: Napoleon Form, MD;  Location: [facility];  Service: Endoscopy;  Laterality: N/A;   BUNIONECTOMY Left 2005   CESAREAN SECTION  01/22/2014   ESOPHAGEAL  MANOMETRY N/A 06/24/2019   Procedure: ESOPHAGEAL MANOMETRY (EM);  Surgeon: Napoleon Form, MD;  Location: [facility];  Service: Endoscopy;  Laterality: N/A;   ESOPHAGOGASTRODUODENOSCOPY N/A 12/18/2019   Procedure: ESOPHAGOGASTRODUODENOSCOPY (EGD);  Surgeon: Shellia Cleverly, DO;  Location: [facility];  Service: Gastroenterology;  Laterality: N/A;   FINGER SURGERY Left    Pinky finger   HIATAL HERNIA REPAIR N/A 12/18/2019   Procedure: LAPAROSCOPIC REPAIR OF HIATAL HERNIA;  Surgeon: Gaynelle Adu, MD;  Location: [facility];  Service: General;  Laterality: N/A;   KNEE ARTHROSCOPY W/ ACL RECONSTRUCTION Left 03/2008   @[facility]    OPEN REDUCTION INTERNAL FIXATION (ORIF) PROXIMAL PHALANX Left 12/08/2020   Procedure: OPEN REDUCTION INTERNAL FIXATION (ORIF) PROXIMAL PHALANX;  Surgeon: Gomez Cleverly, MD;  Location: [facility];  Service: Orthopedics;  Laterality: Left;  with local   PH IMPEDANCE STUDY N/A 06/24/2019   Procedure: PH IMPEDANCE STUDY;  Surgeon: Napoleon Form, MD;  Location: [facility];  Service: Endoscopy;  Laterality: N/A;   TRANSORAL INCISIONLESS FUNDOPLICATION N/A 12/18/2019   Procedure: TRANSORAL INCISIONLESS FUNDOPLICATION;  Surgeon: Shellia Cleverly, DO;  Location: [facility];  Service: Gastroenterology;  Laterality: N/A;   UPPER GASTROINTESTINAL ENDOSCOPY  05/07/2019    FAMILY HISTORY: Family History  Problem Relation Age of Onset   Pulmonary embolism Mother    Prostate cancer Father    Diabetes Father    Kidney disease Maternal Grandmother    Esophageal cancer Maternal Grandfather    Heart disease Paternal Grandfather    Arthritis Other    Dementia Other    Hypertension Other    Heart disease Other    Colon cancer Neg Hx    Rectal cancer Neg Hx    Stomach cancer Neg Hx    Breast cancer Neg  Hx     SOCIAL HISTORY:  Social History   Socioeconomic History   Marital status: Married    Spouse name: Not on file   Number of children: 1   Years of  education: Not on file   Highest education level: Not on file  Occupational History   Occupation: Tree surgeon  Tobacco Use   Smoking status: Never   Smokeless tobacco: Never  Vaping Use   Vaping status: Never Used  Substance and Sexual Activity   Alcohol use: Not Currently   Drug use: Never   Sexual activity: Not Currently    Birth control/protection: Pill  Other Topics Concern   Not on file  Social History Narrative   Patient lives with parents.   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    Patient has a Probation officer.    Social Drivers of Corporate investment banker Strain: Low Risk  (11/22/2022)   Received [facility]   Overall Financial Resource Strain (CARDIA)    Difficulty of Paying Living Expenses: Not hard at all  Food Insecurity: No Food Insecurity (11/22/2022)   Received from [facility]   Hunger Vital Sign    Worried About Running Out of Food in the Last Year: Never true    Ran Out of Food in the Last Year: Never true  Transportation Needs: No Transportation Needs (11/22/2022)   Received from [facility]    In the past 12 months, has lack of transportation kept you from medical appointments or from getting medications?: No    Lack of Transportation (Non-Medical): No  Physical Activity: Not on file  Stress: Not on file  Social Connections: Not on file  Intimate Partner Violence: Not on file     PHYSICAL EXAM  Vitals:   02/13/23 1452  BP: 131/82  Pulse: 75  Weight: 175 lb 12.8 oz (79.7 kg)  Height: 5\' 2"  (1.575 m)     Body mass index is 32.15 kg/m.   General: The patient is well-developed and well-nourished and in no acute distress   Neurologic Exam  Mental status: The patient is alert and oriented x 3 at the time of the examination. The patient has apparent normal recent and remote  memory, with an apparently normal attention span and concentration ability.   Speech is normal.  Cranial nerves:   Extraocular muscles are intact.  Facial strength and sensation is normal.  Trapezius strength is normal.  Hearing is normal symmetric  Motor:  Muscle bulk and tone are normal. Strength is  5 / 5 in all 4 extremities.   Sensory: She has intact sensation to touch and vibration  Coordination: Finger-nose-finger and heel-to-shin is performed well.  Gait and station: Station and gait are normal.  Tandem gait is slightly wide.  Romberg is negative.  Reflexes: Deep tendon reflexes are symmetric and normal in arms.  Reflexes are mildly increased in the right knee relative to the left..         ASSESSMENT AND PLAN  Multiple sclerosis (HCC)  High risk medication use  Common migraine without intractability  Gait disturbance  Other fatigue    1.  MS is stable.  She did [place] in 2017/2018 and has no exacerbations since.   Last MRI showed no new lesions.  We discussed strategies if she has a relapse or significant change on MRI.  2.  Continue Concerta for fatigue and MS related ADD. 3.  Topamax 50-100 mg nightly. 4.  Stay active and try to eat healthy to help with the weight loss.   5.  Return to see me in 6 months or sooner if there are new or worsening neurologic symptoms.  Nicholas Ossa A. Epimenio Foot, MD, PhD 02/13/2023, 3:30 PM Certified in Neurology, Clinical Neurophysiology, Sleep Medicine, Pain Medicine and Neuroimaging  [facility] [address] [place], [place] [address] [phone]-

## 2023-02-18 ENCOUNTER — Other Ambulatory Visit: Payer: Self-pay

## 2023-02-19 ENCOUNTER — Other Ambulatory Visit: Payer: Self-pay

## 2023-02-28 ENCOUNTER — Other Ambulatory Visit: Payer: Self-pay

## 2023-02-28 MED ORDER — FLUOXETINE HCL 10 MG PO TABS
10.0000 mg | ORAL_TABLET | Freq: Every day | ORAL | 3 refills | Status: DC
  Filled 2023-02-28: qty 90, 90d supply, fill #0
  Filled 2023-05-27: qty 90, 90d supply, fill #1
  Filled 2023-08-26: qty 90, 90d supply, fill #2
  Filled 2023-11-15: qty 90, 90d supply, fill #0

## 2023-03-07 ENCOUNTER — Encounter: Payer: Self-pay | Admitting: Gastroenterology

## 2023-03-18 ENCOUNTER — Other Ambulatory Visit: Payer: Self-pay | Admitting: Neurology

## 2023-03-18 ENCOUNTER — Other Ambulatory Visit: Payer: Self-pay

## 2023-03-19 ENCOUNTER — Other Ambulatory Visit: Payer: Self-pay

## 2023-03-20 ENCOUNTER — Other Ambulatory Visit: Payer: Self-pay

## 2023-03-20 MED FILL — Methylphenidate HCl Tab ER Osmotic Release (OSM) 36 MG: ORAL | 30 days supply | Qty: 60 | Fill #0 | Status: AC

## 2023-03-25 ENCOUNTER — Other Ambulatory Visit: Payer: Self-pay

## 2023-04-16 ENCOUNTER — Other Ambulatory Visit: Payer: Self-pay | Admitting: Neurology

## 2023-04-16 ENCOUNTER — Other Ambulatory Visit: Payer: Self-pay

## 2023-04-16 ENCOUNTER — Telehealth: Payer: Commercial Managed Care - PPO | Admitting: Physician Assistant

## 2023-04-16 DIAGNOSIS — J019 Acute sinusitis, unspecified: Secondary | ICD-10-CM

## 2023-04-16 DIAGNOSIS — B9689 Other specified bacterial agents as the cause of diseases classified elsewhere: Secondary | ICD-10-CM | POA: Diagnosis not present

## 2023-04-16 MED ORDER — AMOXICILLIN-POT CLAVULANATE 875-125 MG PO TABS
1.0000 | ORAL_TABLET | Freq: Two times a day (BID) | ORAL | 0 refills | Status: DC
  Filled 2023-04-16: qty 14, 7d supply, fill #0

## 2023-04-16 MED FILL — Topiramate Tab 50 MG: ORAL | 90 days supply | Qty: 180 | Fill #0 | Status: AC

## 2023-04-16 NOTE — Progress Notes (Signed)

## 2023-04-17 ENCOUNTER — Other Ambulatory Visit: Payer: Self-pay

## 2023-04-29 ENCOUNTER — Other Ambulatory Visit: Payer: Self-pay

## 2023-05-22 DIAGNOSIS — G35 Multiple sclerosis: Secondary | ICD-10-CM | POA: Diagnosis not present

## 2023-05-22 DIAGNOSIS — E7849 Other hyperlipidemia: Secondary | ICD-10-CM | POA: Diagnosis not present

## 2023-05-23 DIAGNOSIS — G35 Multiple sclerosis: Secondary | ICD-10-CM | POA: Diagnosis not present

## 2023-05-23 DIAGNOSIS — Z131 Encounter for screening for diabetes mellitus: Secondary | ICD-10-CM | POA: Diagnosis not present

## 2023-05-23 DIAGNOSIS — J4520 Mild intermittent asthma, uncomplicated: Secondary | ICD-10-CM | POA: Diagnosis not present

## 2023-05-23 DIAGNOSIS — Z8639 Personal history of other endocrine, nutritional and metabolic disease: Secondary | ICD-10-CM | POA: Diagnosis not present

## 2023-05-23 DIAGNOSIS — E7849 Other hyperlipidemia: Secondary | ICD-10-CM | POA: Diagnosis not present

## 2023-05-27 ENCOUNTER — Other Ambulatory Visit: Payer: Self-pay

## 2023-05-27 ENCOUNTER — Other Ambulatory Visit: Payer: Self-pay | Admitting: Neurology

## 2023-05-28 ENCOUNTER — Other Ambulatory Visit: Payer: Self-pay

## 2023-05-28 MED FILL — Methylphenidate HCl Tab ER Osmotic Release (OSM) 36 MG: ORAL | 30 days supply | Qty: 60 | Fill #0 | Status: AC

## 2023-05-28 NOTE — Telephone Encounter (Signed)
 Last visit 02-13-2023, next appt 09-30-2023. Last fill 03-27-2023 #60.

## 2023-05-29 ENCOUNTER — Telehealth: Payer: Self-pay

## 2023-05-29 NOTE — Telephone Encounter (Signed)
 Pharmacy Patient Advocate Encounter   Received notification from CoverMyMeds that prior authorization for Voquezna 10MG  tablets is required/requested.   Insurance verification completed.   The patient is insured through [facility] .   Per test claim: PA required; PA submitted to above mentioned insurance via CoverMyMeds Key/confirmation #/EOC [id number] Status is pending

## 2023-05-29 NOTE — Telephone Encounter (Signed)
 Pharmacy Patient Advocate Encounter  Received notification from [facility] that Prior Authorization for Voquezna 10MG  tablets has been APPROVED from 05-29-2023 to 06-26-2023   PA #/Case ID/Reference #: [id number]

## 2023-05-31 ENCOUNTER — Other Ambulatory Visit: Payer: Self-pay

## 2023-06-07 ENCOUNTER — Other Ambulatory Visit: Payer: Self-pay | Admitting: Obstetrics and Gynecology

## 2023-06-07 ENCOUNTER — Ambulatory Visit
Admission: RE | Admit: 2023-06-07 | Discharge: 2023-06-07 | Disposition: A | Discharge status: Home / Self Care | Payer: Commercial Managed Care - PPO | Source: Ambulatory Visit | Attending: Obstetrics and Gynecology | Admitting: Obstetrics and Gynecology

## 2023-06-07 DIAGNOSIS — Z01419 Encounter for gynecological examination (general) (routine) without abnormal findings: Secondary | ICD-10-CM

## 2023-06-07 DIAGNOSIS — R300 Dysuria: Secondary | ICD-10-CM

## 2023-06-07 DIAGNOSIS — Z124 Encounter for screening for malignant neoplasm of cervix: Secondary | ICD-10-CM

## 2023-06-07 DIAGNOSIS — R399 Unspecified symptoms and signs involving the genitourinary system: Secondary | ICD-10-CM

## 2023-06-07 DIAGNOSIS — Z1231 Encounter for screening mammogram for malignant neoplasm of breast: Secondary | ICD-10-CM | POA: Insufficient documentation

## 2023-06-13 DIAGNOSIS — M79674 Pain in right toe(s): Secondary | ICD-10-CM | POA: Diagnosis not present

## 2023-06-14 ENCOUNTER — Other Ambulatory Visit: Payer: Self-pay

## 2023-06-19 ENCOUNTER — Other Ambulatory Visit: Payer: Self-pay

## 2023-06-19 MED ORDER — ALPRAZOLAM 0.5 MG PO TABS
0.5000 mg | ORAL_TABLET | Freq: Every evening | ORAL | 1 refills | Status: DC | PRN
  Filled 2023-06-19: qty 90, 90d supply, fill #0
  Filled 2023-09-17: qty 90, 90d supply, fill #1

## 2023-06-26 ENCOUNTER — Other Ambulatory Visit: Payer: Self-pay

## 2023-07-03 ENCOUNTER — Other Ambulatory Visit: Payer: Self-pay

## 2023-07-18 MED FILL — Topiramate Tab 50 MG: ORAL | 90 days supply | Qty: 180 | Fill #1 | Status: AC

## 2023-07-19 ENCOUNTER — Other Ambulatory Visit: Payer: Self-pay | Admitting: Gastroenterology

## 2023-07-19 ENCOUNTER — Other Ambulatory Visit: Payer: Self-pay

## 2023-07-19 ENCOUNTER — Telehealth: Payer: Self-pay

## 2023-07-19 ENCOUNTER — Other Ambulatory Visit (HOSPITAL_COMMUNITY): Payer: Self-pay

## 2023-07-19 MED FILL — Vonoprazan Fumarate Tab 10 MG: ORAL | 90 days supply | Qty: 90 | Fill #0 | Status: CN

## 2023-07-19 MED FILL — Vonoprazan Fumarate Tab 10 MG: ORAL | 30 days supply | Qty: 30 | Fill #0 | Status: AC

## 2023-07-19 NOTE — Telephone Encounter (Signed)
 Pharmacy Patient Advocate Encounter  Received notification from [facility] that Prior Authorization for Voquezna  10MG  tablets has been APPROVED from 07-19-2023 to 08-16-2023   PA #/Case ID/Reference #: [id number]

## 2023-07-19 NOTE — Telephone Encounter (Signed)
 Pharmacy Patient Advocate Encounter   Received notification from CoverMyMeds that prior authorization for Voquezna  10MG  tablets is required/requested.   Insurance verification completed.   The patient is insured through [facility] .   Per test claim: PA required; PA submitted to above mentioned insurance via CoverMyMeds Key/confirmation #/EOC [id number] Status is pending

## 2023-07-25 ENCOUNTER — Ambulatory Visit: Admitting: Gastroenterology

## 2023-07-25 ENCOUNTER — Encounter: Payer: Self-pay | Admitting: Gastroenterology

## 2023-07-25 VITALS — BP 130/86 | HR 76 | Ht 62.0 in | Wt 187.0 lb

## 2023-07-25 DIAGNOSIS — G35 Multiple sclerosis: Secondary | ICD-10-CM

## 2023-07-25 DIAGNOSIS — K2100 Gastro-esophageal reflux disease with esophagitis, without bleeding: Secondary | ICD-10-CM

## 2023-07-25 DIAGNOSIS — R0989 Other specified symptoms and signs involving the circulatory and respiratory systems: Secondary | ICD-10-CM | POA: Diagnosis not present

## 2023-07-25 DIAGNOSIS — Z1211 Encounter for screening for malignant neoplasm of colon: Secondary | ICD-10-CM

## 2023-07-25 NOTE — Patient Instructions (Signed)
 _______________________________________________________  If your blood pressure at your visit was 140/90 or greater, please contact your primary care physician to follow up on this.  If you are age 45 or younger, your body mass index should be between 19-25. Your Body mass index is 34.2 kg/m. If this is out of the aformentioned range listed, please consider follow up with your Primary Care Provider.  ________________________________________________________  The [facility] providers would like to encourage you to use MYCHART to communicate with providers for non-urgent requests or questions.  Due to long hold times on the telephone, sending your provider a message by [facility] may be a faster and more efficient way to get a response.  Please allow 48 business hours for a response.  Please remember that this is for non-urgent requests.  _______________________________________________________  It was a pleasure to see you today!  Vito Cirigliano, D.O.

## 2023-07-25 NOTE — Progress Notes (Signed)
 Chief Complaint:    GERD  GI History: Carrie Nelson is a 43 year old female with a history of multiple sclerosis, hypothyroidism s/p iodine ablation with subsequent hypothyroidism (on Synthroid ), with a history of GERD, s/p concomitant laparoscopic hiatal hernia repair and TIF 11/2019 as outlined below.   GERD history: -Index symptoms: Regurgitation, increased throat clearing, globus sensation.  Increased nocturnal throat clearing.  No dysphagia or heartburn -Exacerbating factors: Increased postprandial symptoms -Medications trialed: Nexium , Voquenza -Current medications: Voquenza 10 mg daily -Complications: LPR, LES laxity with small HH    GERD evaluation: -Last EGD: 05/07/2019.  Images reviewed by me today -Barium esophagram:  09/2019: Normal motility -Esophageal Manometry: Normal, no HH -pH/Impedance (on PPI): Good acid control with DeMeester 2.2, increased reflux events with increased esophageal bolus exposure with predominantly weakly acid reflux (95 events with 92 being weakly acid) -recommended consideration of antireflux surgery -Laryngoscopy by ENT in  in 01/2019 with e/o reflux irritation.   Endoscopic History: -EGD (04/27/2019, Dr. 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  TIF in place with good length but apparent wrap laxity under full air insufflation and valve agitation.  Otherwise normal stomach.  Bravo placed. - 06/20/2022: Bravo (off PPI): Increased esophageal acid exposure with % time pH <4 of 16.9%.  DeMeester score of 66.  Esophageal acid exposure abnormal in upright and supine position.  No symptom correlation for dysphagia based on SAP. Was started on Voquenza  HPI:     Patient is a 43 y.o. female presenting to the Gastroenterology  Clinic for follow-up. Was last seen in the office on 09/18/2022. Reflux sxs had improved with Voquenza, but still some breakthrough sxs (increased throat clearing, globus). Was referred to ENT, but had not yet scheduled appointment.   Today, she states reflux symptoms are better and responding well to the Voquenza.  Still taking 10 mg daily.  Worse with dairy, fatty foods which she tends to avoid.  Reveiwed labs from 04/2023: Normal CBC, CMP.   MS is stable.  Review of systems:     No chest pain, no SOB, no fevers, no urinary sx   Past Medical History:  Diagnosis Date   Allergic asthma, mild intermittent, uncomplicated    per pt no rescue inhaler because she is under control (foilowed by pcp)   Allergy    Anxiety    Complication of anesthesia    Finger fracture, left    5th phalanx   Gait disturbance    due to MS   GERD (gastroesophageal reflux disease)    History of Graves' disease 2017   s/p RAI 03/ 2019;  hyperthyroidism attributed to [place]  IV infusion   History of repair of hiatal hernia 12/18/2019   History of syncope    hx recurrent , 2004 normal MRI and echo,  per pt no syncope since 2004   Hypothyroidism, 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 Lorelei Rogers, per lov note in epic 06-12-2019 released to pcp   Migraines    Multiple sclerosis, relapsing-remitting [facility])    neurologist--- dr r. sater;  dx 2007, takes ADHD med for chronic fatigue   PONV (postoperative nausea and vomiting)    Tremor, essential    Wears glasses     Patient's surgical history, family medical history, social history, medications and allergies were all reviewed  in Epic    Current Outpatient Medications  Medication Sig Dispense Refill   ALPRAZolam  (XANAX ) 0.5 MG tablet Take 1 tablet (0.5 mg total) by mouth at bedtime as needed. 90 tablet 1   amoxicillin -clavulanate (AUGMENTIN ) 875-125 MG tablet Take 1 tablet by mouth 2 (two) times daily. (Patient not taking: Reported on 07/25/2023) 14 tablet 0    FLUoxetine  (PROZAC ) 10 MG tablet Take 1 tablet by mouth once daily 90 tablet 3   levothyroxine  (SYNTHROID ) 137 MCG tablet Take 1 tablet (137 mcg total) by mouth daily. Take on an empty stomach with a glass of water at least 30-60 minutes before breakfast. 30 tablet 11   loratadine (CLARITIN) 10 MG tablet Take 10 mg by mouth at bedtime.     methylphenidate  36 MG PO CR tablet Take 1 tablet (36 mg total) by mouth daily. May also take 1 tablet (36 mg total) daily as needed. 60 tablet 0   Norethindrone  Acetate-Ethinyl Estradiol  (LOESTRIN ) 1.5-30 MG-MCG tablet Take 1 tablet by mouth daily. 63 tablet 5   saccharomyces boulardii (FLORASTOR) 250 MG capsule Take 250 mg by mouth daily.     topiramate  (TOPAMAX ) 50 MG tablet Take 1-2 tablets (50-100 mg total) by mouth at bedtime. 180 tablet 1   Vonoprazan Fumarate  (VOQUEZNA ) 10 MG TABS Take 1 tablet by mouth daily. 90 tablet 0   No current facility-administered medications for this visit.    Physical Exam:     BP 130/86   Pulse 76   Ht 5\' 2"  (1.575 m)   Wt 187 lb (84.8 kg)   BMI 34.20 kg/m   GENERAL:  Pleasant female in NAD PSYCH: : Cooperative, normal affect NEURO: Alert and oriented x 3   IMPRESSION and PLAN:    1) GERD 2) Increased throat clearing Breakthrough reflux symptoms now largely well-controlled after starting Voquenza.  Does have continued increased throat clearing, but otherwise no heartburn, regurgitation. - Continue Voquenza 10 mg daily - Continue antireflux lifestyle/dietary modification - Encouraged her to follow-up with ENT referral to evaluate for persistent throat clearing despite good acid control  3) Colon cancer screening Turns 43 later this year.  Discussed CRC screening options at length today, to include optical colonoscopy, Cologuard, virtual colonoscopy.  She would like to think about her options and get back to me  4) Multiple sclerosis Stable.  Follows regularly with Neurology Clinic  RTC in 1 year  I  spent 30 minutes of time, including in depth chart review, independent review of results as outlined above, communicating results with the patient directly, face-to-face time with the patient, coordinating care, and ordering studies and medications as appropriate, and documentation.         Annis Kinder ,DO, FACG 07/25/2023, 9:52 AM

## 2023-07-26 ENCOUNTER — Other Ambulatory Visit: Payer: Self-pay | Admitting: Neurology

## 2023-07-26 ENCOUNTER — Other Ambulatory Visit: Payer: Self-pay

## 2023-07-29 ENCOUNTER — Other Ambulatory Visit: Payer: Self-pay

## 2023-07-29 MED FILL — Methylphenidate HCl Tab ER Osmotic Release (OSM) 36 MG: ORAL | 30 days supply | Qty: 60 | Fill #0 | Status: AC

## 2023-07-29 NOTE — Telephone Encounter (Signed)
 Last visit: 02/13/23  Next visit: 09/30/23  Per epic adherence report:    Rx refill request sent to Dr Godwin Lat to approve.

## 2023-07-30 ENCOUNTER — Other Ambulatory Visit: Payer: Self-pay

## 2023-08-26 ENCOUNTER — Other Ambulatory Visit: Payer: Self-pay

## 2023-08-26 MED FILL — Vonoprazan Fumarate Tab 10 MG: ORAL | 30 days supply | Qty: 30 | Fill #1 | Status: CN

## 2023-09-02 ENCOUNTER — Telehealth: Payer: Self-pay

## 2023-09-02 NOTE — Telephone Encounter (Signed)
 Pharmacy Patient Advocate Encounter   Received notification from CoverMyMeds that prior authorization for Voquezna  10MG  tablets is required/requested.   Insurance verification completed.   The patient is insured through [facility] .   Per test claim: PA required; PA submitted to above mentioned insurance via CoverMyMeds Key/confirmation #/EOC [id number] Status is pending

## 2023-09-03 ENCOUNTER — Other Ambulatory Visit: Payer: Self-pay

## 2023-09-03 MED FILL — Vonoprazan Fumarate Tab 10 MG: ORAL | 30 days supply | Qty: 30 | Fill #1 | Status: AC

## 2023-09-03 NOTE — Telephone Encounter (Signed)
 Pharmacy Patient Advocate Encounter  Received notification from [facility] that Prior Authorization for Voquezna  10MG  tablets has been APPROVED from 09-02-2023 to 09-30-2023   PA #/Case ID/Reference #: [id number]

## 2023-09-17 ENCOUNTER — Other Ambulatory Visit: Payer: Self-pay

## 2023-09-25 DIAGNOSIS — H524 Presbyopia: Secondary | ICD-10-CM | POA: Diagnosis not present

## 2023-09-30 ENCOUNTER — Encounter: Payer: Self-pay | Admitting: Neurology

## 2023-09-30 ENCOUNTER — Other Ambulatory Visit: Payer: Self-pay

## 2023-09-30 ENCOUNTER — Other Ambulatory Visit: Payer: Self-pay | Admitting: Neurology

## 2023-09-30 ENCOUNTER — Ambulatory Visit: Payer: Commercial Managed Care - PPO | Admitting: Neurology

## 2023-09-30 VITALS — BP 129/82 | HR 89 | Ht 62.0 in | Wt 187.0 lb

## 2023-09-30 DIAGNOSIS — R269 Unspecified abnormalities of gait and mobility: Secondary | ICD-10-CM

## 2023-09-30 DIAGNOSIS — G43009 Migraine without aura, not intractable, without status migrainosus: Secondary | ICD-10-CM

## 2023-09-30 DIAGNOSIS — Z79899 Other long term (current) drug therapy: Secondary | ICD-10-CM

## 2023-09-30 DIAGNOSIS — R5383 Other fatigue: Secondary | ICD-10-CM | POA: Diagnosis not present

## 2023-09-30 DIAGNOSIS — G35 Multiple sclerosis: Secondary | ICD-10-CM

## 2023-09-30 NOTE — Progress Notes (Signed)
 [facility]  PATIENT: Carrie Nelson DOB: 07-23-1980  REFERRING CLINICIAN: Ophelia Sage HISTORY FROM: Patient    REASON FOR VISIT: Multiple Sclerosis   HISTORICAL  CHIEF COMPLAINT:  Chief Complaint  Patient presents with   Follow-up    Pt in room 10. Alone. Here for MS follow up. Patient reports doing well. No concerns.                                                f                                                                    HISTORY OF PRESENT ILLNESS:  Carrie Nelson is a 43 y.o. woman who was diagnosed with relapsing remitting MS in 2007.    Update 02/13/2023: She feels her MS has been stable.    She denies any MS exacerbation or new symptom.  She did Lemtrada  in November 2017 and November 2018.    She has completed the 4 years of REMS.    She had hyperthyroidism and she had radioactive iodine ablation.   She has hyothyroidism now and is on synthroid .     MRI brain 01/04/2023 showed Multiple T2/FLAIR hyperintense foci in the cerebral hemispheres and foci in the left middle cerebellar peduncle and left pons in a pattern consistent with chronic demyelinating plaque associated with multiple sclerosis. None of the foci enhanced or appear to be acute. Compared to the MRI from 2021, there were no new lesions.   Gait is doing well.  She has no falls. She feels balance is fine.  She has had surgery x 2 in left knee so will use the bannister going down but not up on stairs.     She has no weakness in the arms.  No limb numbness and facial numbness has resolved.   Bladder is about the same .   She has had frequent UTI's.   She feels she does not empty but does not have hesitation.    Vision is doing well  She is sleeping well on xanax  (low dose prescribed by PCP). She snores but no definite OSA sign.  She does not have excessive daytime sleepiness   She has MS related ADD and fatigue.  Concerta  has helped.(Takes 36 mg most days.     Mood is doing well on  fluoxetine .    She has had weight fluctuations.   She only has rare migraines.   Just 1-2 this year.   She is on topiramate .      MS History:    She was diagnosed in 2007 but,  in retrospect, had right foot dragging that started around 2005. Then shortly after that she had an episode of left facial numbness. In 2007 she had an episode of slurred speech and worsening clumsiness.  MRI of the brain that was consistent with multiple sclerosis. She also had a lumbar puncture and her CSF compatible with the diagnosis of MS. Most of her symptoms improved over the next few months but she did not get 100% back to baseline and she continues to  have minimal residual weakness, clumsiness and numbness. She was initially placed on Betaseron but switched to Tysabri because of some generalized side effects of Betaseron. She was seeing Dr. Reyes 2007 until 2014.   She was on Tysabri between 2008 and late 2014 when she stopped because of a desire to get pregnant. She became pregnant in early 2015 and delivered in November 2015. She had not been on any disease modifying therapy since late 2014 and then started Tecfidera  02/2014.SABRA   She did not tolerate it well and started Lemtrada  12/2015.    Imaging: MRI of the brain 11/02/2015 shows T2/FLAIR hyperintense foci in the pons and the periventricular, juxtacortical and deep white matter of both hemispheres in a pattern and configuration consistent with chronic demyelinating plaque associated with multiple sclerosis. None of the foci enhanced after contrast administration. Compared to the MRI dated 07/23/2014, there is no interval change.  MRI of the cervical spine 01/29/2013 showed a normal spinal cord and no significant degenerative changes.  MRI 01/13/2020 showed multiple T2/FLAIR hyperintense foci in the hemispheres, left thalamus and left pons in a pattern and configuration consistent with chronic demyelinating plaque associated with multiple sclerosis.  None of the foci  enhance or appear to be acute.  Compared to the MRI from 07/23/2014, there are no new lesions.  MRI 01/04/2023 showed Multiple T2/FLAIR hyperintense foci in the cerebral hemispheres and foci in the left middle cerebellar peduncle and left pons in a pattern consistent with chronic demyelinating plaque associated with multiple sclerosis. None of the foci enhanced or appear to be acute. Compared to the MRI from 2021, there were no new lesions.   REVIEW OF SYSTEMS:  Constitutional: No fevers, chills, sweats, or change in appetite Eyes: No visual changes, double vision, eye pain Ear, nose and throat: No hearing loss, ear pain, nasal congestion, sore throat Cardiovascular: No chest pain, palpitations Respiratory:  No shortness of breath at rest or with exertion.   No wheezes GastrointestinaI: No nausea, vomiting, diarrhea, abdominal pain, fecal incontinence Genitourinary:  No dysuria, urinary retention or frequency.  No nocturia. Musculoskeletal:  No neck pain, back pain Integumentary: No rash, pruritus, skin lesions Neurological: as above Psychiatric: No depression at this time.  No anxiety.   Occasionally she has irritability. Endocrine: She she is borderline diabetic. No palpitations, diaphoresis, change in appetite, change in weigh or increased thirst Hematologic/Lymphatic:  No anemia, purpura, petechiae. Allergic/Immunologic: She has seasonal allergies and is treated with several medications.  ALLERGIES: Allergies  Allergen Reactions   Scopolamine  Other (See Comments)    Per pt caused vision problem    HOME MEDICATIONS: Outpatient Medications Prior to Visit  Medication Sig Dispense Refill   ALPRAZolam  (XANAX ) 0.5 MG tablet Take 1 tablet (0.5 mg total) by mouth at bedtime as needed. 90 tablet 1   FLUoxetine  (PROZAC ) 10 MG tablet Take 1 tablet by mouth once daily 90 tablet 3   levothyroxine  (SYNTHROID ) 137 MCG tablet Take 1 tablet (137 mcg total) by mouth daily. Take on an empty stomach  with a glass of water at least 30-60 minutes before breakfast. 30 tablet 11   methylphenidate  36 MG PO CR tablet Take 1 tablet (36 mg total) by mouth daily. May also take 1 tablet (36 mg total) daily as needed. 60 tablet 0   Norethindrone  Acetate-Ethinyl Estradiol  (LOESTRIN ) 1.5-30 MG-MCG tablet Take 1 tablet by mouth daily. 63 tablet 5   saccharomyces boulardii (FLORASTOR) 250 MG capsule Take 250 mg by mouth daily.  topiramate  (TOPAMAX ) 50 MG tablet Take 1-2 tablets (50-100 mg total) by mouth at bedtime. 180 tablet 1   Vonoprazan Fumarate  (VOQUEZNA ) 10 MG TABS Take 1 tablet by mouth daily. 90 tablet 0   amoxicillin -clavulanate (AUGMENTIN ) 875-125 MG tablet Take 1 tablet by mouth 2 (two) times daily. (Patient not taking: Reported on 09/30/2023) 14 tablet 0   loratadine (CLARITIN) 10 MG tablet Take 10 mg by mouth at bedtime. (Patient not taking: Reported on 09/30/2023)     No facility-administered medications prior to visit.    PAST MEDICAL HISTORY: Past Medical History:  Diagnosis Date   Allergic asthma, mild intermittent, uncomplicated    per pt no rescue inhaler because she is under control (foilowed by pcp)   Allergy    Anxiety    Complication of anesthesia    Finger fracture, left    5th phalanx   Gait disturbance    due to MS   GERD (gastroesophageal reflux disease)    History of Graves' disease 2017   s/p RAI 03/ 2019;  hyperthyroidism attributed to Lemtrada  IV infusion   History of repair of hiatal hernia 12/18/2019   History of syncope    hx recurrent , 2004 normal MRI and echo,  per pt no syncope since 2004   Hypothyroidism, 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 damian, per lov note in epic 06-12-2019 released to pcp   Migraines    Multiple sclerosis, relapsing-remitting [facility])    neurologist--- dr r. Levar Fayson;  dx 2007, takes ADHD med for chronic fatigue   PONV (postoperative nausea and vomiting)    Tremor, essential    Wears glasses     PAST SURGICAL  HISTORY: Past Surgical History:  Procedure Laterality Date   75 HOUR PH STUDY N/A 06/24/2019   Procedure: 24 HOUR PH STUDY;  Surgeon: Shila Gustav GAILS, MD;  Location: [facility];  Service: Endoscopy;  Laterality: N/A;   BUNIONECTOMY Left 2005   CESAREAN SECTION  01/22/2014   ESOPHAGEAL MANOMETRY N/A 06/24/2019   Procedure: ESOPHAGEAL MANOMETRY (EM);  Surgeon: Shila Gustav GAILS, MD;  Location: [facility];  Service: Endoscopy;  Laterality: N/A;   ESOPHAGOGASTRODUODENOSCOPY N/A 12/18/2019   Procedure: ESOPHAGOGASTRODUODENOSCOPY (EGD);  Surgeon: San Sandor GAILS, DO;  Location: [facility];  Service: Gastroenterology;  Laterality: N/A;   FINGER SURGERY Left    Pinky finger   HIATAL HERNIA REPAIR N/A 12/18/2019   Procedure: LAPAROSCOPIC REPAIR OF HIATAL HERNIA;  Surgeon: Tanda Locus, MD;  Location: [facility];  Service: General;  Laterality: N/A;   KNEE ARTHROSCOPY W/ ACL RECONSTRUCTION Left 03/2008   @[facility]    OPEN REDUCTION INTERNAL FIXATION (ORIF) PROXIMAL PHALANX Left 12/08/2020   Procedure: OPEN REDUCTION INTERNAL FIXATION (ORIF) PROXIMAL PHALANX;  Surgeon: Alyse Agent, MD;  Location: [facility] ;  Service: Orthopedics;  Laterality: Left;  with local   PH IMPEDANCE STUDY N/A 06/24/2019   Procedure: PH IMPEDANCE STUDY;  Surgeon: Shila Gustav GAILS, MD;  Location: [facility];  Service: Endoscopy;  Laterality: N/A;   TRANSORAL INCISIONLESS FUNDOPLICATION N/A 12/18/2019   Procedure: TRANSORAL INCISIONLESS FUNDOPLICATION;  Surgeon: San Sandor GAILS, DO;  Location: [facility];  Service: Gastroenterology;  Laterality: N/A;   UPPER GASTROINTESTINAL ENDOSCOPY  05/07/2019    FAMILY HISTORY: Family History  Problem Relation Age of Onset   Pulmonary embolism Mother    Prostate cancer Father    Diabetes Father    Kidney disease Maternal Grandmother    Esophageal cancer Maternal Grandfather    Heart disease Paternal Grandfather  Arthritis Other    Dementia Other     Hypertension Other    Heart disease Other    Colon cancer Neg Hx    Rectal cancer Neg Hx    Stomach cancer Neg Hx    Breast cancer Neg Hx     SOCIAL HISTORY:  Social History   Socioeconomic History   Marital status: Married    Spouse name: Not on file   Number of children: 1   Years of education: Not on file   Highest education level: Not on file  Occupational History   Occupation: Tree surgeon  Tobacco Use   Smoking status: Never   Smokeless tobacco: Never  Vaping Use   Vaping status: Never Used  Substance and Sexual Activity   Alcohol use: Not Currently   Drug use: Never   Sexual activity: Not Currently    Birth control/protection: Pill  Other Topics Concern   Not on file  Social History Narrative   Patient lives with parents.   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    Patient has a Probation officer.    Social Drivers of Corporate investment banker Strain: Low Risk  (11/22/2022)   Received [facility]   Overall Financial Resource Strain (CARDIA)    Difficulty of Paying Living Expenses: Not hard at all  Food Insecurity: No Food Insecurity (11/22/2022)   Received from [facility]   Hunger Vital Sign    Within the past 12 months, you worried that your food would run out before you got the money to buy more.: Never true    Within the past 12 months, the food you bought just didn't last and you didn't have money to get more.: Never true  Transportation Needs: No Transportation Needs (11/22/2022)   Received from [facility] Transportation    In the past 12 months, has lack of transportation kept you from medical appointments or from getting medications?: No    Lack of Transportation (Non-Medical): No  Physical Activity: Not on file  Stress: Not on file  Social Connections: Not on file  Intimate Partner Violence: Not on  file     PHYSICAL EXAM  Vitals:   09/30/23 1002  BP: 129/82  Pulse: 89  Weight: 187 lb (84.8 kg)  Height: 5' 2 (1.575 m)     Body mass index is 34.2 kg/m.   General: The patient is well-developed and well-nourished and in no acute distress   Neurologic Exam  Mental status: The patient is alert and oriented x 3 at the time of the examination. The patient has apparent normal recent and remote memory, with an apparently normal attention span and concentration ability.   Speech is normal.  Cranial nerves:   Extraocular muscles are intact.  Facial strength and sensation is normal.  Trapezius strength is normal.  Hearing is normal symmetric  Motor:  Muscle bulk and tone are normal. Strength is  5 / 5 in all 4 extremities.   Sensory: She has intact sensation to touch and vibration  Coordination: Finger-nose-finger and heel-to-shin is performed well.  Gait and station: Station and gait are normal.  Tandem gait is slightly wide. Romberg negative Reflexes: Deep tendon reflexes are symmetric and normal in arms.  Reflexes are mildly increased in the right knee relative to the left..         ASSESSMENT  AND PLAN  Multiple sclerosis (HCC)  Gait disturbance  High risk medication use  Other fatigue  Common migraine without intractability    1.  MS is stable.   She is not on a DMT..  She did Lemtrada  in 2017/2018 and has no exacerbations since.   Last MRI 12/2022 showed no new lesions.  We discussed strategies if she has a relapse or significant change on MRI. 2.  Continue Concerta  for fatigue and MS related ADD. 3.  Topamax  50-100 mg nightly. 4.  Stay active and try to eat healthy to help with the weight loss.   5.  Return to see me in 6-7 months or sooner if there are new or worsening neurologic symptoms.  Shavonda Wiedman A. Vear, MD, PhD 09/30/2023, 10:39 AM Certified in Neurology, Clinical Neurophysiology, Sleep Medicine, Pain Medicine and Neuroimaging  [facility] [address] [place], [place] [address] [phone]

## 2023-10-01 ENCOUNTER — Other Ambulatory Visit: Payer: Self-pay

## 2023-10-01 MED FILL — Methylphenidate HCl Tab ER Osmotic Release (OSM) 36 MG: ORAL | 30 days supply | Qty: 60 | Fill #0 | Status: AC

## 2023-10-02 ENCOUNTER — Other Ambulatory Visit: Payer: Self-pay

## 2023-10-02 IMAGING — MG MM DIGITAL SCREENING BILAT W/ TOMO AND CAD
7 series · 8 of 19 positions shown · non-contrast
Comparison: Previous exam(s).

CLINICAL DATA: Screening.

EXAM:
DIGITAL SCREENING BILATERAL MAMMOGRAM WITH TOMOSYNTHESIS AND CAD
TECHNIQUE: Bilateral screening digital craniocaudal and mediolateral oblique
mammograms were obtained. Bilateral screening digital breast
tomosynthesis was performed. The images were evaluated with
computer-aided detection.

[L MLO synth-2D]
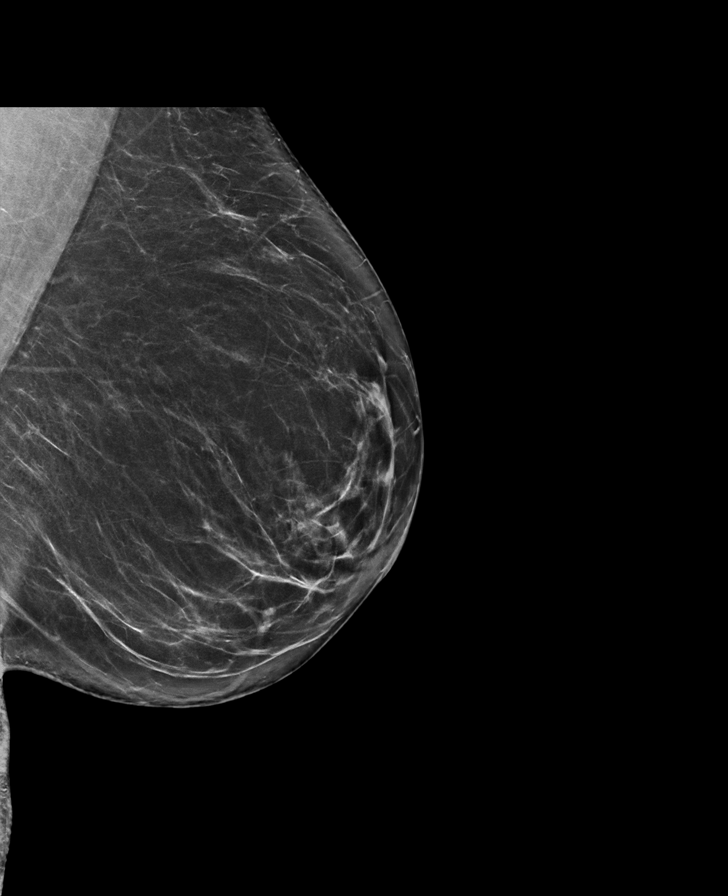

[L CC synth-2D]
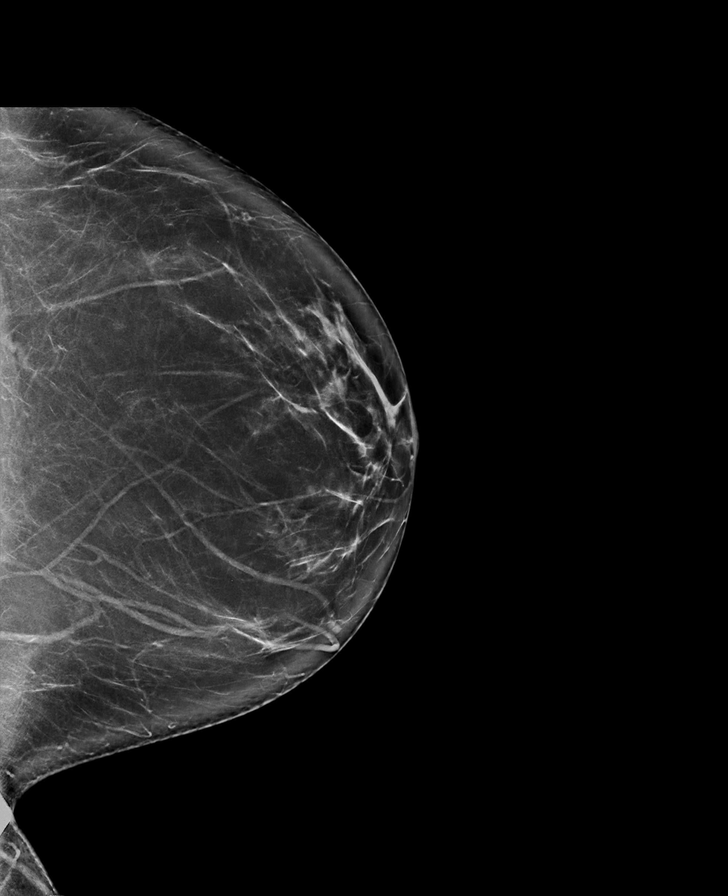

[R MLO synth-2D]
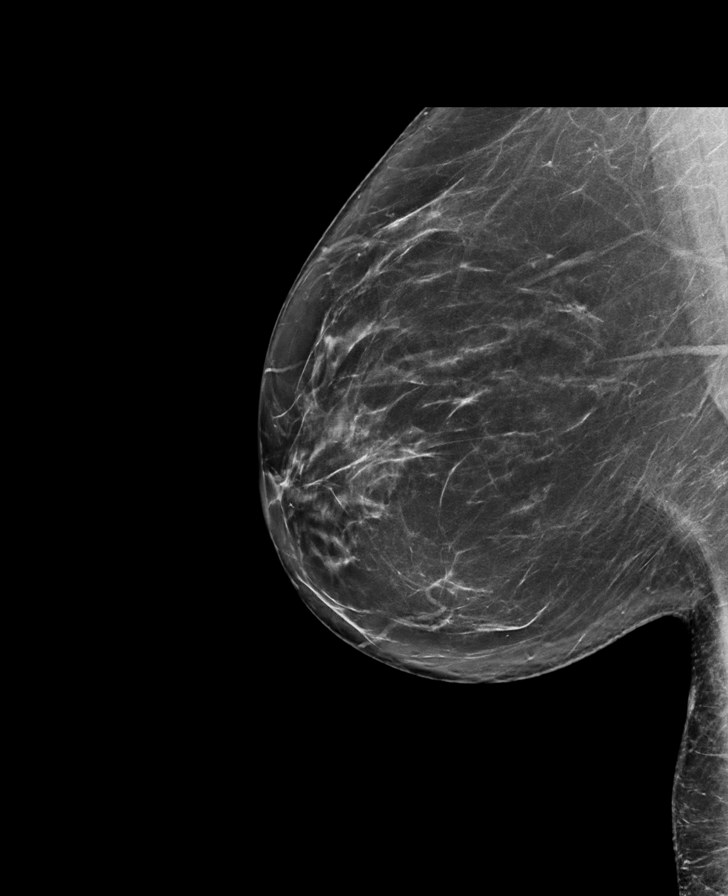

[R CC synth-2D]
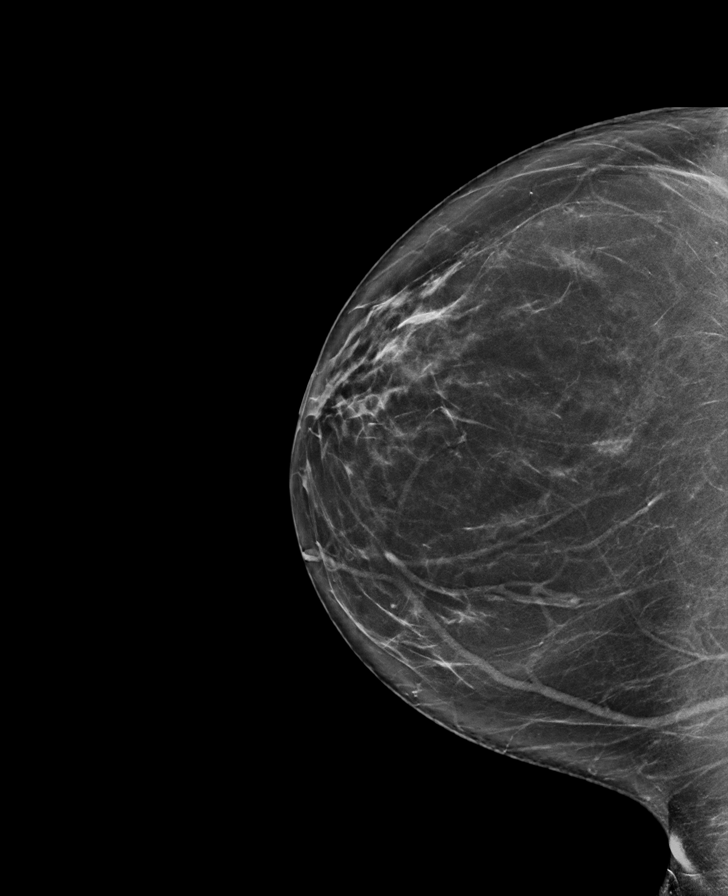

[L MLO tomo · 2 of 81 frames shown]
[frame 27/81]
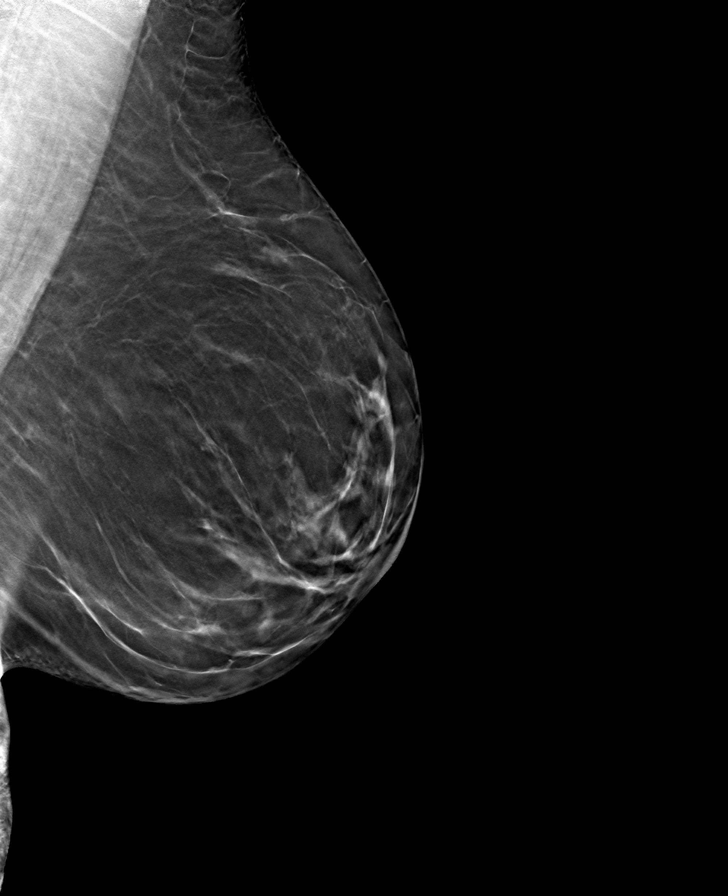
[frame 41/81]
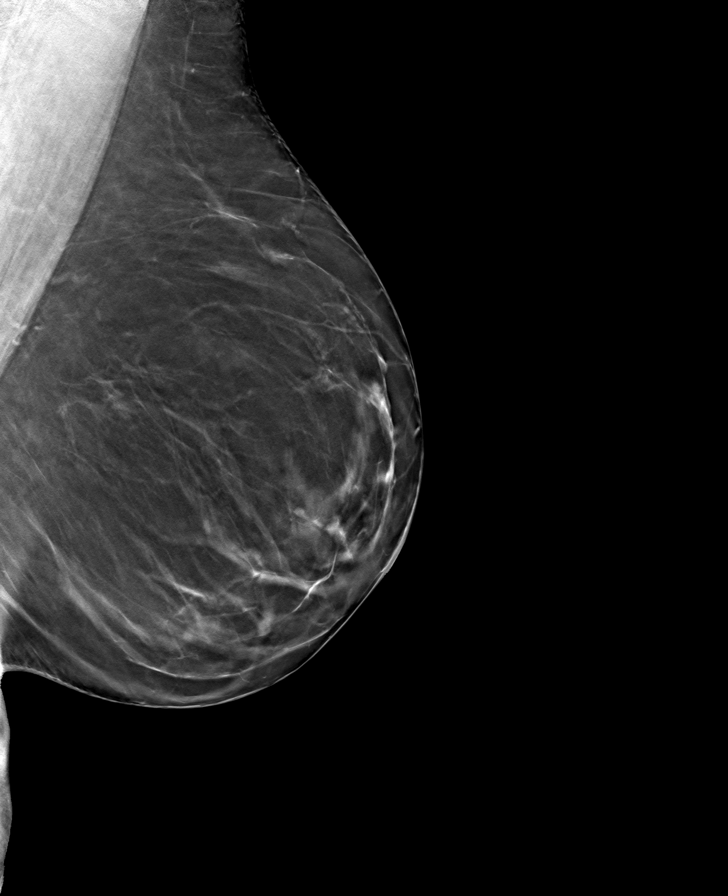

[R MLO tomo · tomo slice 47/92.0]
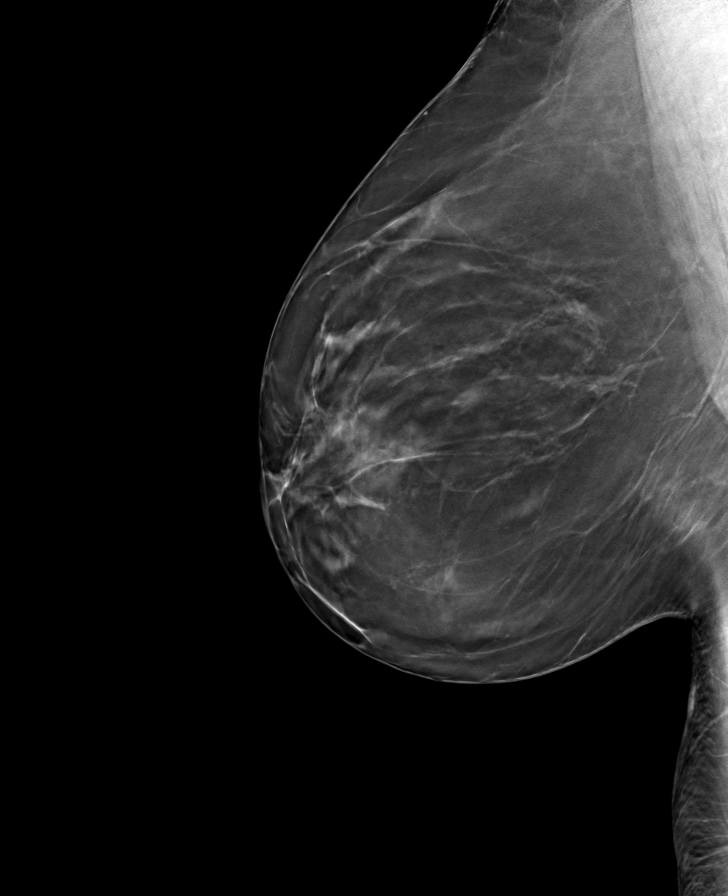

[L CC tomo · tomo slice 41/82.0]
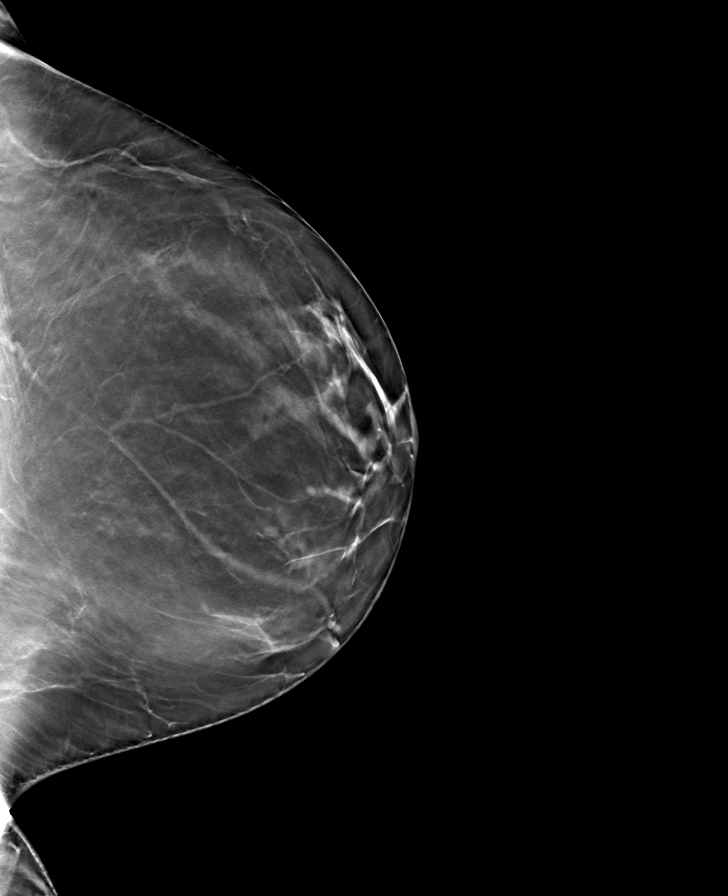

[8 of 19 positions shown; findings below may reference images not displayed]

ACR Breast Density Category b: There are scattered areas of
fibroglandular density.
FINDINGS: There are no findings suspicious for malignancy.
IMPRESSION: No mammographic evidence of malignancy. A result letter of this
screening mammogram will be mailed directly to the patient.

RECOMMENDATION:
Screening mammogram in one year. (Code:51-O-LD2)

BI-RADS CATEGORY  1: Negative.

## 2023-10-08 ENCOUNTER — Other Ambulatory Visit: Payer: Self-pay

## 2023-10-08 MED FILL — Vonoprazan Fumarate Tab 10 MG: ORAL | 30 days supply | Qty: 30 | Fill #2 | Status: CN

## 2023-10-17 ENCOUNTER — Other Ambulatory Visit (HOSPITAL_COMMUNITY): Payer: Self-pay

## 2023-10-17 ENCOUNTER — Telehealth: Payer: Self-pay

## 2023-10-17 NOTE — Telephone Encounter (Signed)
 Pharmacy Patient Advocate Encounter   Received notification from CoverMyMeds that prior authorization for Voquezna  10MG  tablets is required/requested.   Insurance verification completed.   The patient is insured through [facility] .   Per test claim: PA required; PA submitted to above mentioned insurance via Latent Key/confirmation #/EOC [id number] Status is pending

## 2023-10-18 NOTE — Telephone Encounter (Signed)
 Pharmacy Patient Advocate Encounter  Received notification from [facility] that Prior Authorization for Voquezna  10MG  tablets has been APPROVED from 10-17-2023 to 11-13-2023   PA #/Case ID/Reference #: [id number]

## 2023-10-23 ENCOUNTER — Other Ambulatory Visit: Payer: Self-pay | Admitting: Neurology

## 2023-10-23 ENCOUNTER — Other Ambulatory Visit: Payer: Self-pay

## 2023-10-23 MED ORDER — TOPIRAMATE 50 MG PO TABS
50.0000 mg | ORAL_TABLET | Freq: Every evening | ORAL | 0 refills | Status: DC
  Filled 2023-10-23: qty 180, 90d supply, fill #0

## 2023-10-25 ENCOUNTER — Other Ambulatory Visit: Payer: Self-pay

## 2023-10-25 MED FILL — Vonoprazan Fumarate Tab 10 MG: ORAL | 30 days supply | Qty: 30 | Fill #2 | Status: AC

## 2023-10-29 ENCOUNTER — Other Ambulatory Visit: Payer: Self-pay

## 2023-11-15 ENCOUNTER — Encounter: Payer: Self-pay | Admitting: Pharmacist

## 2023-11-15 ENCOUNTER — Other Ambulatory Visit (HOSPITAL_COMMUNITY): Payer: Self-pay

## 2023-11-15 ENCOUNTER — Other Ambulatory Visit: Payer: Self-pay | Admitting: Gastroenterology

## 2023-11-15 ENCOUNTER — Other Ambulatory Visit: Payer: Self-pay

## 2023-11-15 MED ORDER — VOQUEZNA 10 MG PO TABS
2.0000 | ORAL_TABLET | Freq: Every day | ORAL | 2 refills | Status: DC
  Filled 2023-11-15: qty 90, 45d supply, fill #0
  Filled 2023-11-18: qty 60, 30d supply, fill #0

## 2023-11-18 ENCOUNTER — Other Ambulatory Visit (HOSPITAL_BASED_OUTPATIENT_CLINIC_OR_DEPARTMENT_OTHER): Payer: Self-pay

## 2023-11-18 ENCOUNTER — Other Ambulatory Visit: Payer: Self-pay

## 2023-11-18 ENCOUNTER — Other Ambulatory Visit (HOSPITAL_COMMUNITY): Payer: Self-pay

## 2023-11-18 ENCOUNTER — Encounter: Payer: Self-pay | Admitting: Pharmacist

## 2023-11-18 ENCOUNTER — Encounter: Payer: Self-pay | Admitting: Gastroenterology

## 2023-11-18 MED ORDER — VOQUEZNA 10 MG PO TABS: 1.0000 | ORAL_TABLET | Freq: Every day | ORAL | Status: DC

## 2023-11-28 ENCOUNTER — Other Ambulatory Visit: Payer: Self-pay | Admitting: Neurology

## 2023-11-28 ENCOUNTER — Other Ambulatory Visit: Payer: Self-pay

## 2023-11-29 ENCOUNTER — Other Ambulatory Visit: Payer: Self-pay

## 2023-11-29 DIAGNOSIS — Z131 Encounter for screening for diabetes mellitus: Secondary | ICD-10-CM | POA: Diagnosis not present

## 2023-11-29 DIAGNOSIS — G35D Multiple sclerosis, unspecified: Secondary | ICD-10-CM | POA: Diagnosis not present

## 2023-11-29 DIAGNOSIS — E7849 Other hyperlipidemia: Secondary | ICD-10-CM | POA: Diagnosis not present

## 2023-11-29 DIAGNOSIS — Z8639 Personal history of other endocrine, nutritional and metabolic disease: Secondary | ICD-10-CM | POA: Diagnosis not present

## 2023-12-02 ENCOUNTER — Other Ambulatory Visit: Payer: Self-pay

## 2023-12-02 MED FILL — Methylphenidate HCl Tab ER Osmotic Release (OSM) 36 MG: ORAL | 30 days supply | Qty: 60 | Fill #0 | Status: AC

## 2023-12-02 NOTE — Telephone Encounter (Signed)
 Last seen on 09/30/23 Follow up scheduled on 05/05/24   Dispensed Days Supply Quantity Provider Pharmacy  methylphenidate  36 MG PO CR tablet 10/02/2023 30 60 tablet Sater, Charlie LABOR, MD [facility]...     Rx pending to be signed

## 2023-12-05 ENCOUNTER — Other Ambulatory Visit: Payer: Self-pay | Admitting: Gastroenterology

## 2023-12-05 ENCOUNTER — Other Ambulatory Visit: Payer: Self-pay

## 2023-12-05 DIAGNOSIS — G35D Multiple sclerosis, unspecified: Secondary | ICD-10-CM | POA: Diagnosis not present

## 2023-12-05 DIAGNOSIS — J4520 Mild intermittent asthma, uncomplicated: Secondary | ICD-10-CM | POA: Diagnosis not present

## 2023-12-05 DIAGNOSIS — F419 Anxiety disorder, unspecified: Secondary | ICD-10-CM | POA: Diagnosis not present

## 2023-12-05 DIAGNOSIS — Z0000 Encounter for general adult medical examination without abnormal findings: Secondary | ICD-10-CM | POA: Diagnosis not present

## 2023-12-05 DIAGNOSIS — Z8639 Personal history of other endocrine, nutritional and metabolic disease: Secondary | ICD-10-CM | POA: Diagnosis not present

## 2023-12-05 DIAGNOSIS — Z1331 Encounter for screening for depression: Secondary | ICD-10-CM | POA: Diagnosis not present

## 2023-12-05 DIAGNOSIS — E7849 Other hyperlipidemia: Secondary | ICD-10-CM | POA: Diagnosis not present

## 2023-12-05 DIAGNOSIS — Z131 Encounter for screening for diabetes mellitus: Secondary | ICD-10-CM | POA: Diagnosis not present

## 2023-12-05 MED ORDER — VOQUEZNA 10 MG PO TABS
1.0000 | ORAL_TABLET | Freq: Every day | ORAL | 2 refills | Status: AC
  Filled 2023-12-05 – 2023-12-13 (×3): qty 30, 30d supply, fill #0
  Filled 2024-01-13 – 2024-03-12 (×3): qty 30, 30d supply, fill #1

## 2023-12-09 ENCOUNTER — Other Ambulatory Visit: Payer: Self-pay

## 2023-12-10 ENCOUNTER — Telehealth: Payer: Self-pay

## 2023-12-10 ENCOUNTER — Other Ambulatory Visit (HOSPITAL_COMMUNITY): Payer: Self-pay

## 2023-12-10 NOTE — Telephone Encounter (Signed)
 Pharmacy Patient Advocate Encounter  Received notification from MED[employer] that Prior Authorization for Voquezna  10MG  tablets has been APPROVED from 12-10-2023 to 01-06-2024. Ran test claim, Copay is $126.55 for 30 day supply. This test claim was processed through [facility]- copay amounts may vary at other pharmacies due to pharmacy/plan contracts, or as the patient moves through the different stages of their insurance plan.   PA #/Case ID/Reference #: [id number]

## 2023-12-10 NOTE — Telephone Encounter (Signed)
 Pharmacy Patient Advocate Encounter   Received notification from CoverMyMeds that prior authorization for Voquezna  10MG  tablets is required/requested.   Insurance verification completed.   The patient is insured through [facility].   Per test claim: PA required; PA submitted to above mentioned insurance via Latent Key/confirmation #/EOC [id number] Status is pending

## 2023-12-10 NOTE — Telephone Encounter (Signed)
 Left message for pt to call back

## 2023-12-10 NOTE — Telephone Encounter (Signed)
 Patient returning call. Please advise

## 2023-12-10 NOTE — Telephone Encounter (Signed)
 Pt made aware. Pt verbalized understanding with all questions answered.

## 2023-12-12 ENCOUNTER — Other Ambulatory Visit: Payer: Self-pay

## 2023-12-13 ENCOUNTER — Other Ambulatory Visit: Payer: Self-pay

## 2023-12-13 MED ORDER — LEVOTHYROXINE SODIUM 137 MCG PO TABS
137.0000 ug | ORAL_TABLET | Freq: Every day | ORAL | 11 refills | Status: AC
  Filled 2023-12-13: qty 30, 30d supply, fill #0
  Filled 2024-01-08: qty 30, 30d supply, fill #1
  Filled 2024-02-07: qty 30, 30d supply, fill #2
  Filled 2024-03-14: qty 30, 30d supply, fill #0

## 2023-12-13 MED ORDER — ALPRAZOLAM 0.5 MG PO TABS
0.5000 mg | ORAL_TABLET | Freq: Every evening | ORAL | 1 refills | Status: AC | PRN
  Filled 2023-12-13 – 2023-12-14 (×2): qty 90, 90d supply, fill #0
  Filled 2024-03-12: qty 90, 90d supply, fill #1

## 2023-12-14 ENCOUNTER — Other Ambulatory Visit: Payer: Self-pay

## 2024-01-02 DIAGNOSIS — F419 Anxiety disorder, unspecified: Secondary | ICD-10-CM | POA: Diagnosis not present

## 2024-01-02 DIAGNOSIS — Z8639 Personal history of other endocrine, nutritional and metabolic disease: Secondary | ICD-10-CM | POA: Diagnosis not present

## 2024-01-08 ENCOUNTER — Other Ambulatory Visit: Payer: Self-pay

## 2024-01-13 ENCOUNTER — Other Ambulatory Visit: Payer: Self-pay

## 2024-01-16 ENCOUNTER — Other Ambulatory Visit: Payer: Self-pay | Admitting: Neurology

## 2024-01-16 ENCOUNTER — Other Ambulatory Visit: Payer: Self-pay

## 2024-01-16 MED ORDER — TOPIRAMATE 50 MG PO TABS
50.0000 mg | ORAL_TABLET | Freq: Every evening | ORAL | 0 refills | Status: AC
  Filled 2024-01-16: qty 180, 90d supply, fill #0

## 2024-01-16 NOTE — Telephone Encounter (Signed)
 Last seen on 09/30/23 Follow up scheduled 05/05/24

## 2024-01-22 ENCOUNTER — Other Ambulatory Visit: Payer: Self-pay

## 2024-02-03 ENCOUNTER — Other Ambulatory Visit: Payer: Self-pay

## 2024-02-03 ENCOUNTER — Other Ambulatory Visit: Payer: Self-pay | Admitting: Neurology

## 2024-02-04 ENCOUNTER — Other Ambulatory Visit: Payer: Self-pay

## 2024-02-04 MED FILL — Methylphenidate HCl Tab ER Osmotic Release (OSM) 36 MG: ORAL | 30 days supply | Qty: 60 | Fill #0 | Status: AC

## 2024-02-04 NOTE — Telephone Encounter (Signed)
 Requested Prescriptions   Pending Prescriptions Disp Refills   METHYLPHENIDATE  36 MG PO CR tablet [Pharmacy Med Name: methylphenidate  (CONCERTA ) 36 MG CR tablet] 60 tablet 0    Sig: Take 1 tablet (36 mg total) by mouth daily. May also take 1 tablet (36 mg total) daily as needed.   Last seen 09/30/23 Next appt 05/05/24 Dispenses   Dispensed Days Supply Quantity Provider Pharmacy  methylphenidate  36 MG PO CR tablet 12/05/2023 30 60 tablet Sater, Charlie LABOR, MD [facility] - [facility]...  methylphenidate  36 MG PO CR tablet 10/02/2023 30 60 tablet Sater, Charlie LABOR, MD [facility] - [facility]...  methylphenidate  36 MG PO CR tablet 07/30/2023 30 60 tablet Sater, Charlie LABOR, MD [facility] - Co...  methylphenidate  36 MG PO CR tablet 05/28/2023 30 60 tablet Sater, Charlie LABOR, MD [facility] - Co...  methylphenidate  36 MG PO CR tablet 03/27/2023 30 60 tablet Sater, Charlie LABOR, MD [facility] - Co..SABRA

## 2024-02-05 ENCOUNTER — Other Ambulatory Visit: Payer: Self-pay

## 2024-02-07 ENCOUNTER — Other Ambulatory Visit: Payer: Self-pay

## 2024-02-17 ENCOUNTER — Other Ambulatory Visit: Payer: Self-pay

## 2024-02-17 ENCOUNTER — Other Ambulatory Visit (HOSPITAL_COMMUNITY): Payer: Self-pay

## 2024-02-17 MED ORDER — FLUOXETINE HCL 10 MG PO TABS
10.0000 mg | ORAL_TABLET | Freq: Every day | ORAL | 3 refills | Status: AC
  Filled 2024-02-17: qty 90, 90d supply, fill #0

## 2024-02-18 ENCOUNTER — Other Ambulatory Visit: Payer: Self-pay

## 2024-02-26 ENCOUNTER — Other Ambulatory Visit: Payer: Self-pay | Admitting: Certified Nurse Midwife

## 2024-02-26 ENCOUNTER — Encounter: Payer: Self-pay | Admitting: Gastroenterology

## 2024-02-26 ENCOUNTER — Other Ambulatory Visit: Payer: Self-pay

## 2024-02-26 ENCOUNTER — Other Ambulatory Visit: Payer: Self-pay | Admitting: Neurology

## 2024-02-26 ENCOUNTER — Other Ambulatory Visit (HOSPITAL_COMMUNITY): Payer: Self-pay

## 2024-02-26 DIAGNOSIS — Z01419 Encounter for gynecological examination (general) (routine) without abnormal findings: Secondary | ICD-10-CM

## 2024-02-26 MED ORDER — NORETHINDRONE ACET-ETHINYL EST 1.5-30 MG-MCG PO TABS
1.0000 | ORAL_TABLET | Freq: Every day | ORAL | 0 refills | Status: DC
  Filled 2024-02-26: qty 63, 84d supply, fill #0

## 2024-02-28 ENCOUNTER — Other Ambulatory Visit (HOSPITAL_COMMUNITY): Payer: Self-pay

## 2024-03-02 ENCOUNTER — Telehealth: Payer: Self-pay

## 2024-03-02 ENCOUNTER — Other Ambulatory Visit (HOSPITAL_COMMUNITY): Payer: Self-pay

## 2024-03-02 NOTE — Telephone Encounter (Signed)
 PA request has been Submitted. New Encounter has been or will be created for follow up. For additional info see Pharmacy Prior Auth telephone encounter from 03-02-2024.

## 2024-03-02 NOTE — Telephone Encounter (Signed)
 Pharmacy Patient Advocate Encounter   Received notification from Patient Advice Request messages that prior authorization for Voquezna  10MG  tablets is required/requested.   Insurance verification completed.   The patient is insured through [facility].   Per test claim: PA required; PA submitted to above mentioned insurance via Latent Key/confirmation #/EOC [id number] Status is pending

## 2024-03-02 NOTE — Telephone Encounter (Signed)
 Last seen on 09/30/23 Follow up scheduled on 05/05/24   Dispensed Days Supply Quantity Provider Pharmacy  topiramate  (TOPAMAX ) 50 MG tablet 01/22/2024 90 180 tablet Sater, Charlie LABOR, MD [facility] - [facility]...      Rx denied too soon to refill.

## 2024-03-02 NOTE — Telephone Encounter (Signed)
 Pharmacy Patient Advocate Encounter  Received notification from MED[employer] that Prior Authorization for Voquezna  10MG  tablets has been APPROVED from 03-02-2024 to 03-30-2024. Ran test claim, Copay is $126.66. This test claim was processed through [facility]- copay amounts may vary at other pharmacies due to pharmacy/plan contracts, or as the patient moves through the different stages of their insurance plan.   PA #/Case ID/Reference #: [id number]

## 2024-03-03 NOTE — Telephone Encounter (Signed)
 Pt made aware that the PA was approved. Pt verbalized understanding with all questions answered.

## 2024-03-06 ENCOUNTER — Encounter: Payer: Self-pay | Admitting: Certified Nurse Midwife

## 2024-03-06 ENCOUNTER — Ambulatory Visit (INDEPENDENT_AMBULATORY_CARE_PROVIDER_SITE_OTHER): Admitting: Certified Nurse Midwife

## 2024-03-06 VITALS — BP 137/86 | HR 88 | Ht 62.0 in | Wt 187.0 lb

## 2024-03-06 DIAGNOSIS — N898 Other specified noninflammatory disorders of vagina: Secondary | ICD-10-CM | POA: Diagnosis not present

## 2024-03-06 DIAGNOSIS — Z01411 Encounter for gynecological examination (general) (routine) with abnormal findings: Secondary | ICD-10-CM

## 2024-03-06 DIAGNOSIS — N3941 Urge incontinence: Secondary | ICD-10-CM | POA: Diagnosis not present

## 2024-03-06 DIAGNOSIS — Z01419 Encounter for gynecological examination (general) (routine) without abnormal findings: Secondary | ICD-10-CM

## 2024-03-06 DIAGNOSIS — Z1231 Encounter for screening mammogram for malignant neoplasm of breast: Secondary | ICD-10-CM

## 2024-03-06 NOTE — Patient Instructions (Addendum)
 Preventive Care 46-46 Years Old, Female Preventive care refers to lifestyle choices and visits with your health care provider that can promote health and wellness. Preventive care visits are also called wellness exams. What can I expect for my preventive care visit? Counseling Your health care provider may ask you questions about your: Medical history, including: Past medical problems. Family medical history. Pregnancy history. Current health, including: Menstrual cycle. Method of birth control. Emotional well-being. Home life and relationship well-being. Sexual activity and sexual health. Lifestyle, including: Alcohol, nicotine or tobacco, and drug use. Access to firearms. Diet, exercise, and sleep habits. Work and work astronomer. Sunscreen use. Safety issues such as seatbelt and bike helmet use. Physical exam Your health care provider will check your: Height and weight. These may be used to calculate your BMI (body mass index). BMI is a measurement that tells if you are at a healthy weight. Waist circumference. This measures the distance around your waistline. This measurement also tells if you are at a healthy weight and may help predict your risk of certain diseases, such as type 2 diabetes and high blood pressure. Heart rate and blood pressure. Body temperature. Skin for abnormal spots. What immunizations do I need?  Vaccines are usually given at various ages, according to a schedule. Your health care provider will recommend vaccines for you based on your age, medical history, and lifestyle or other factors, such as travel or where you work. What tests do I need? Screening Your health care provider may recommend screening tests for certain conditions. This may include: Lipid and cholesterol levels. Diabetes screening. This is done by checking your blood sugar (glucose) after you have not eaten for a while (fasting). Pelvic exam and Pap test. Hepatitis B test. Hepatitis C  test. HIV (human immunodeficiency virus) test. STI (sexually transmitted infection) testing, if you are at risk. Lung cancer screening. Colorectal cancer screening. Mammogram. Talk with your health care provider about when you should start having regular mammograms. This may depend on whether you have a family history of breast cancer. BRCA-related cancer screening. This may be done if you have a family history of breast, ovarian, tubal, or peritoneal cancers. Bone density scan. This is done to screen for osteoporosis. Talk with your health care provider about your test results, treatment options, and if necessary, the need for more tests. Follow these instructions at home: Eating and drinking  Eat a diet that includes fresh fruits and vegetables, whole grains, lean protein, and low-fat dairy products. Take vitamin and mineral supplements as recommended by your health care provider. Do not drink alcohol if: Your health care provider tells you not to drink. You are pregnant, may be pregnant, or are planning to become pregnant. If you drink alcohol: Limit how much you have to 0-1 drink a day. Know how much alcohol is in your drink. In the [place] one drink equals one 12 oz bottle of beer (355 mL), one 5 oz glass of wine (148 mL), or one 1 oz glass of hard liquor (44 mL). Lifestyle Brush your teeth every morning and night with fluoride toothpaste. Floss one time each day. Exercise for at least 30 minutes 5 or more days each week. Do not use any products that contain nicotine or tobacco. These products include cigarettes, chewing tobacco, and vaping devices, such as e-cigarettes. If you need help quitting, ask your health care provider. Do not use drugs. If you are sexually active, practice safe sex. Use a condom or other form of protection to  prevent STIs. If you do not wish to become pregnant, use a form of birth control. If you plan to become pregnant, see your health care provider for a  prepregnancy visit. Take aspirin only as told by your health care provider. Make sure that you understand how much to take and what form to take. Work with your health care provider to find out whether it is safe and beneficial for you to take aspirin daily. Find healthy ways to manage stress, such as: Meditation, yoga, or listening to music. Journaling. Talking to a trusted person. Spending time with friends and family. Minimize exposure to UV radiation to reduce your risk of skin cancer. Safety Always wear your seat belt while driving or riding in a vehicle. Do not drive: If you have been drinking alcohol. Do not ride with someone who has been drinking. When you are tired or distracted. While texting. If you have been using any mind-altering substances or drugs. Wear a helmet and other protective equipment during sports activities. If you have firearms in your house, make sure you follow all gun safety procedures. Seek help if you have been physically or sexually abused. What's next? Visit your health care provider once a year for an annual wellness visit. Ask your health care provider how often you should have your eyes and teeth checked. Stay up to date on all vaccines. This information is not intended to replace advice given to you by your health care provider. Make sure you discuss any questions you have with your health care provider. Document Revised: 08/10/2020 Document Reviewed: 08/10/2020 Elsevier Patient Education  2024 [employer]How to Do a Breast Self-Exam Doing breast self-exams can help you stay healthy. They're one way to know what's normal for your breasts. They can help you catch a problem while it's still small and can be treated. You need to: Check your breasts often. Tell your doctor about any changes. You should do breast self-exams even if you have breast implants. What you need: A mirror. A well-lit room. A pillow or other soft object. How to do a breast  self-exam Look for changes  Take off all the clothes above your waist. Stand in front of a mirror in a room with good lighting. Put your hands down at your sides. Compare your breasts in the mirror. Look for difference between them, such as: Differences in shape. Differences in size. Wrinkles, dips, and bumps in one breast and not the other. Look at each breast for skin changes, such as: Redness. Scaly spots. Spots where your skin is thicker. Dimpling. Open sores. Look for changes in your nipples, such as: Fluid coming out of a nipple. Fluid around a nipple. Bleeding. Dimpling. Redness. A nipple that looks pushed in or that has changed position. Feel for changes Lie on your back. Feel each breast. To do this: Pick a breast to feel. Place a pillow under the shoulder closest to that breast. Put the arm closest to that breast behind your head. Feel the breast using the hand of your other arm. Use the pads of your three middle fingers to make small circles starting near the nipple. Use light, medium, and firm pressure. Keep making circles, moving down over the breast. Stop when you feel your ribs. Start making circles with your fingers again, this time going up until you reach your collarbone. Then, make circles out across your breast and into your armpit area. Squeeze your nipple. Check for fluid and lumps. Do these steps again to  check your other breast. Sit or stand in the tub or shower. With soapy water on your skin, feel each breast the same way you did when you were lying down. Write down what you find Writing down what you find can help you keep track of what you want to tell your doctor. Write down: What's normal for each breast. Any changes you find. Write down: The kind of change. If your breast feels tender or painful. Any lump you find. Write down its size and where it is. When you last had your period. General tips If you're breastfeeding, the best time to check  your breasts is after you feed your baby or after you use a breast pump. If you get a period, the best time to check your breasts is 5-7 days after your period ends. With time, you'll get more used to doing the self-exam. You'll also start to know if there are changes in your breasts. Contact a doctor if: You see a change in the shape or size of your breasts or nipples. You see a change in the skin of your breast or nipples. You have fluid coming from your nipples that isn't normal. You find a new lump or thick area. You have breast pain. You have any concerns about your breast health. This information is not intended to replace advice given to you by your health care provider. Make sure you discuss any questions you have with your health care provider. Document Revised: 04/24/2023 Document Reviewed: 04/24/2023 Elsevier Patient Education  2025 [employer]. Health Maintenance, Female Adopting a healthy lifestyle and getting preventive care are important in promoting health and wellness. Ask your health care provider about: The right schedule for you to have regular tests and exams. Things you can do on your own to prevent diseases and keep yourself healthy. What should I know about diet, weight, and exercise? Eat a healthy diet  Eat a diet that includes plenty of vegetables, fruits, low-fat dairy products, and lean protein. Do not eat a lot of foods that are high in solid fats, added sugars, or sodium. Maintain a healthy weight Body mass index (BMI) is used to identify weight problems. It estimates body fat based on height and weight. Your health care provider can help determine your BMI and help you achieve or maintain a healthy weight. Get regular exercise Get regular exercise. This is one of the most important things you can do for your health. Most adults should: Exercise for at least 150 minutes each week. The exercise should increase your heart rate and make you sweat  (moderate-intensity exercise). Do strengthening exercises at least twice a week. This is in addition to the moderate-intensity exercise. Spend less time sitting. Even light physical activity can be beneficial. Watch cholesterol and blood lipids Have your blood tested for lipids and cholesterol at 46 years of age, then have this test every 5 years. Have your cholesterol levels checked more often if: Your lipid or cholesterol levels are high. You are older than 46 years of age. You are at high risk for heart disease. What should I know about cancer screening? Depending on your health history and family history, you may need to have cancer screening at various ages. This may include screening for: Breast cancer. Cervical cancer. Colorectal cancer. Skin cancer. Lung cancer. What should I know about heart disease, diabetes, and high blood pressure? Blood pressure and heart disease High blood pressure causes heart disease and increases the risk of stroke. This is  more likely to develop in people who have high blood pressure readings or are overweight. Have your blood pressure checked: Every 3-5 years if you are 81-39 years of age. Every year if you are 65 years old or older. Diabetes Have regular diabetes screenings. This checks your fasting blood sugar level. Have the screening done: Once every three years after age 23 if you are at a normal weight and have a low risk for diabetes. More often and at a younger age if you are overweight or have a high risk for diabetes. What should I know about preventing infection? Hepatitis B If you have a higher risk for hepatitis B, you should be screened for this virus. Talk with your health care provider to find out if you are at risk for hepatitis B infection. Hepatitis C Testing is recommended for: Everyone born from [birth year] through 1967. Anyone with known risk factors for hepatitis C. Sexually transmitted infections (STIs) Get screened for STIs,  including gonorrhea and chlamydia, if: You are sexually active and are younger than 46 years of age. You are older than 46 years of age and your health care provider tells you that you are at risk for this type of infection. Your sexual activity has changed since you were last screened, and you are at increased risk for chlamydia or gonorrhea. Ask your health care provider if you are at risk. Ask your health care provider about whether you are at high risk for HIV. Your health care provider may recommend a prescription medicine to help prevent HIV infection. If you choose to take medicine to prevent HIV, you should first get tested for HIV. You should then be tested every 3 months for as long as you are taking the medicine. Pregnancy If you are about to stop having your period (premenopausal) and you may become pregnant, seek counseling before you get pregnant. Take 400 to 800 micrograms (mcg) of folic acid every day if you become pregnant. Ask for birth control (contraception) if you want to prevent pregnancy. Osteoporosis and menopause Osteoporosis is a disease in which the bones lose minerals and strength with aging. This can result in bone fractures. If you are 31 years old or older, or if you are at risk for osteoporosis and fractures, ask your health care provider if you should: Be screened for bone loss. Take a calcium or vitamin D  supplement to lower your risk of fractures. Be given hormone replacement therapy (HRT) to treat symptoms of menopause. Follow these instructions at home: Alcohol use Do not drink alcohol if: Your health care provider tells you not to drink. You are pregnant, may be pregnant, or are planning to become pregnant. If you drink alcohol: Limit how much you have to: 0-1 drink a day. Know how much alcohol is in your drink. In the [place] one drink equals one 12 oz bottle of beer (355 mL), one 5 oz glass of wine (148 mL), or one 1 oz glass of hard liquor (44  mL). Lifestyle Do not use any products that contain nicotine or tobacco. These products include cigarettes, chewing tobacco, and vaping devices, such as e-cigarettes. If you need help quitting, ask your health care provider. Do not use street drugs. Do not share needles. Ask your health care provider for help if you need support or information about quitting drugs. General instructions Schedule regular health, dental, and eye exams. Stay current with your vaccines. Tell your health care provider if: You often feel depressed. You have ever been  abused or do not feel safe at home. Summary Adopting a healthy lifestyle and getting preventive care are important in promoting health and wellness. Follow your health care provider's instructions about healthy diet, exercising, and getting tested or screened for diseases. Follow your health care provider's instructions on monitoring your cholesterol and blood pressure. This information is not intended to replace advice given to you by your health care provider. Make sure you discuss any questions you have with your health care provider. Document Revised: 07/04/2020 Document Reviewed: 07/04/2020 Elsevier Patient Education  2024 [employer] Colonoscopy, Adult A colonoscopy is a procedure to look at the entire large intestine. This procedure is done using a long, thin, flexible tube that has a camera on the end. You may have a colonoscopy: As a part of normal colorectal screening. If you have certain symptoms, such as: A low number of red blood cells in your blood (anemia). Diarrhea that does not go away. Pain in your abdomen. Blood in your stool. A colonoscopy can help screen for and diagnose medical problems, including: An abnormal growth of cells or tissue (tumor). Abnormal growths within the lining of your intestine (polyps). Inflammation. Areas of bleeding. Tell your health care provider about: Any allergies you have. All medicines you  are taking, including vitamins, herbs, eye drops, creams, and over-the-counter medicines. Any problems you or family members have had with anesthetic medicines. Any bleeding problems you have. Any surgeries you have had. Any medical conditions you have. Any problems you have had with having bowel movements. Whether you are pregnant or may be pregnant. What are the risks? Generally, this is a safe procedure. However, problems may occur, including: Bleeding. Damage to your intestine. Allergic reactions to medicines given during the procedure. Infection. This is rare. What happens before the procedure? Eating and drinking restrictions Follow instructions from your health care provider about eating or drinking restrictions, which may include: A few days before the procedure: Follow a low-fiber diet. Avoid nuts, seeds, dried fruit, raw fruits, and vegetables. 1-3 days before the procedure: Eat only gelatin dessert or ice pops. Drink only clear liquids, such as water, clear juice, clear broth or bouillon, black coffee or tea, or clear soft drinks or sports drinks. Avoid liquids that contain red or purple dye. The day of the procedure: Do not eat solid foods. You may continue to drink clear liquids until up to 2 hours before the procedure. Do not eat or drink anything starting 2 hours before the procedure, or within the time period that your health care provider recommends. Bowel prep If you were prescribed a bowel prep to take by mouth (orally) to clean out your colon: Take it as told by your health care provider. Starting the day before your procedure, you will need to drink a large amount of liquid medicine. The liquid will cause you to have many bowel movements of loose stool until your stool becomes almost clear or light green. If your skin or the opening between the buttocks (anus) gets irritated from diarrhea, you may relieve the irritation using: Wipes with medicine in them, such as  adult wet wipes with aloe and vitamin E. A product to soothe skin, such as petroleum jelly. If you vomit while drinking the bowel prep: Take a break for up to 60 minutes. Begin the bowel prep again. Call your health care provider if you keep vomiting or you cannot take the bowel prep without vomiting. To clean out your colon, you may also be given: Laxative  medicines. These help you have a bowel movement. Instructions for enema use. An enema is liquid medicine injected into your rectum. Medicines Ask your health care provider about: Changing or stopping your regular medicines or supplements. This is especially important if you are taking iron supplements, diabetes medicines, or blood thinners. Taking medicines such as aspirin and ibuprofen. These medicines can thin your blood. Do not take these medicines unless your health care provider tells you to take them. Taking over-the-counter medicines, vitamins, herbs, and supplements. General instructions Ask your health care provider what steps will be taken to help prevent infection. These may include washing skin with a germ-killing soap. If you will be going home right after the procedure, plan to have a responsible adult: Take you home from the hospital or clinic. You will not be allowed to drive. Care for you for the time you are told. What happens during the procedure?  An IV will be inserted into one of your veins. You will be given a medicine to make you fall asleep (general anesthetic). You will lie on your side with your knees bent. A lubricant will be put on the tube. Then the tube will be: Inserted into your anus. Gently eased through all parts of your large intestine. Air will be sent into your colon to keep it open. This may cause some pressure or cramping. Images will be taken with the camera and will appear on a screen. A small tissue sample may be removed to be looked at under a microscope (biopsy). The tissue may be sent to a  lab for testing if any signs of problems are found. If small polyps are found, they may be removed and checked for cancer cells. When the procedure is finished, the tube will be removed. The procedure may vary among health care providers and hospitals. What happens after the procedure? Your blood pressure, heart rate, breathing rate, and blood oxygen level will be monitored until you leave the hospital or clinic. You may have a small amount of blood in your stool. You may pass gas and have mild cramping or bloating in your abdomen. This is caused by the air that was used to open your colon during the exam. If you were given a sedative during the procedure, it can affect you for several hours. Do not drive or operate machinery until your health care provider says that it is safe. It is up to you to get the results of your procedure. Ask your health care provider, or the department that is doing the procedure, when your results will be ready. Summary A colonoscopy is a procedure to look at the entire large intestine. Follow instructions from your health care provider about eating and drinking before the procedure. If you were prescribed an oral bowel prep to clean out your colon, take it as told by your health care provider. During the colonoscopy, a flexible tube with a camera on its end is inserted into the anus and then passed into all parts of the large intestine. This information is not intended to replace advice given to you by your health care provider. Make sure you discuss any questions you have with your health care provider. Document Revised: 03/27/2022 Document Reviewed: 10/05/2020 Elsevier Patient Education  2024 [employer].

## 2024-03-06 NOTE — Progress Notes (Unsigned)
 "  ANNUAL EXAM Patient name: Carrie Nelson MRN [id number]  Date of birth: 07-04-1980 Chief Complaint:   Annual Exam  History of Present Illness:   Carrie Nelson is a 44 y.o. G71P1001 Caucasian female being seen today for a routine annual exam.  Current complaints: Patient reports vaginal odor for two weeks. Has been using boric acid which helps the odor but if she stops use the odor returns.  No LMP recorded (within years). (Menstrual status: Oral contraceptives).   Exercise: not regular, active caring for 10yo son & parents--also active with thrifting Employed FT by [employer] education (not clinical) Wears seatbelt Denies IPV, T/E/D.       Upstream - 03/06/24 1330       Pregnancy Intention Screening   Does the patient want to become pregnant in the next year? No    Does the patient's partner want to become pregnant in the next year? No    Would the patient like to discuss contraceptive options today? No      Contraception Wrap Up   Current Method Abstinence    End Method Abstinence    Contraception Counseling Provided No         The pregnancy intention screening data noted above was reviewed. Potential methods of contraception were discussed. The patient elected to proceed with Abstinence.      Component Value Date/Time   DIAGPAP  12/28/2022 0819    - Negative for intraepithelial lesion or malignancy (NILM)   DIAGPAP  11/19/2019 1522    - Negative for intraepithelial lesion or malignancy (NILM)   HPVHIGH Negative 12/28/2022 0819   HPVHIGH Negative 11/19/2019 1522   ADEQPAP  12/28/2022 0819    Satisfactory for evaluation; transformation zone component PRESENT.   ADEQPAP  11/19/2019 1522    Satisfactory for evaluation; transformation zone component PRESENT.      Last pap 12/28/22. Results were: normal. H/O abnormal pap: no Last mammogram: 06/07/23. Results were: normal. Family h/o breast cancer: no Last colonoscopy: N/A. Results were: N/A. Family h/o  colorectal cancer: no     11/24/2020   10:10 AM 11/13/2018   10:39 AM  Depression screen PHQ 2/9  Decreased Interest 0 0  Down, Depressed, Hopeless 0 0  PHQ - 2 Score 0 0  Altered sleeping 0 1  Tired, decreased energy 0 2  Change in appetite 0   Feeling bad or failure about yourself  0 1  Trouble concentrating 0 1  Moving slowly or fidgety/restless 0 2  Suicidal thoughts 0 0  PHQ-9 Score 0  7   Difficult doing work/chores Not difficult at all Somewhat difficult     Data saved with a previous flowsheet row definition        11/24/2020   10:11 AM  GAD 7 : Generalized Anxiety Score  Nervous, Anxious, on Edge 0  Control/stop worrying 0  Worry too much - different things 0  Trouble relaxing 0  Restless 0  Easily annoyed or irritable 0  Afraid - awful might happen 0  Total GAD 7 Score 0      Past Medical History:  Diagnosis Date   Allergic asthma, mild intermittent, uncomplicated    per pt no rescue inhaler because she is under control (foilowed by pcp)   Allergy    Anxiety    Complication of anesthesia    Finger fracture, left    5th phalanx   Gait disturbance    due to MS   GERD (gastroesophageal reflux disease)  History of Graves' disease 2017   s/p RAI 03/ 2019;  hyperthyroidism attributed to [place]  IV infusion   History of repair of hiatal hernia 12/18/2019   History of syncope    hx recurrent , 2004 normal MRI and echo,  per pt no syncope since 2004   Hypothyroidism, 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 damian, per lov note in epic 06-12-2019 released to pcp   Migraines    Multiple sclerosis, relapsing-remitting    neurologist--- dr r. sater;  dx 2007, takes ADHD med for chronic fatigue   PONV (postoperative nausea and vomiting)    Tremor, essential    Wears glasses     Family History  Problem Relation Age of Onset   Pulmonary embolism Mother    Prostate cancer Father    Diabetes Father    Kidney disease Maternal Grandmother     Esophageal cancer Maternal Grandfather    Heart disease Paternal Grandfather    Arthritis Other    Dementia Other    Hypertension Other    Heart disease Other    Colon cancer Neg Hx    Rectal cancer Neg Hx    Stomach cancer Neg Hx    Breast cancer Neg Hx    Review of Systems:   Pertinent items are noted in HPI Denies any headaches, blurred vision, fatigue, shortness of breath, chest pain, abdominal pain, problems with periods, bowel movements, urination, or intercourse unless otherwise stated above. Pertinent History Reviewed:  Reviewed past medical,surgical, social and family history.  Reviewed problem list, medications and allergies. Physical Assessment:   Vitals:   03/06/24 1330  BP: 137/86  Pulse: 88  Weight: 187 lb (84.8 kg)  Height: 5' 2 (1.575 m)  Body mass index is 34.2 kg/m.       Physical Exam Vitals reviewed.  Constitutional:      General: She is not in acute distress.    Appearance: Normal appearance.  HENT:     Head: Normocephalic.  Neck:     Thyroid : No thyroid  mass or thyromegaly.  Cardiovascular:     Rate and Rhythm: Normal rate and regular rhythm.     Heart sounds: Normal heart sounds.  Pulmonary:     Effort: Pulmonary effort is normal.     Breath sounds: Normal breath sounds.  Chest:  Breasts:    Tanner Score is 5.     Right: Normal.     Left: Normal.  Abdominal:     General: Abdomen is flat.     Palpations: Abdomen is soft.     Tenderness: There is no abdominal tenderness.  Genitourinary:    General: Normal vulva.     Tanner stage (genital): 5.     Labia:        Right: No rash or lesion.        Left: No rash or lesion.   Musculoskeletal:     Cervical back: Neck supple. No tenderness.  Lymphadenopathy:     Upper Body:     Right upper body: No axillary adenopathy.     Left upper body: No axillary adenopathy.  Skin:    General: Skin is warm and dry.  Neurological:     General: No focal deficit present.     Mental Status: She is  alert and oriented to person, place, and time.  Psychiatric:        Mood and Affect: Mood normal.        Behavior: Behavior normal.  No results found for this or any previous visit (from the past 24 hours).  Assessment & Plan:  1. Well woman exam with routine gynecological exam (Primary) - Norethindrone  Acetate-Ethinyl Estradiol  (LARIN  1.5/30) 1.5-30 MG-MCG tablet; Take 1 tablet by mouth daily.  Dispense: 84 tablet; Refill: 4  2. Vaginal odor - Cervicovaginal ancillary only - Urine Culture  3. Breast cancer screening by mammogram - MM 3D SCREENING MAMMOGRAM BILATERAL BREAST; Future  4. Urge incontinence - Urine Culture   Mammogram: due April, order placed, or sooner if problems Colonoscopy: per GI, or sooner if problems  Orders Placed This Encounter  Procedures   MM 3D SCREENING MAMMOGRAM BILATERAL BREAST    Meds:  Meds ordered this encounter  Medications   Norethindrone  Acetate-Ethinyl Estradiol  (LARIN  1.5/30) 1.5-30 MG-MCG tablet    Sig: Take 1 tablet by mouth daily.    Dispense:  84 tablet    Refill:  4    Follow-up: Return in about 1 year (around 03/06/2025) for Annual exam.  Harlene LITTIE Cisco, CNM 03/06/2024 2:03 PM  "

## 2024-03-08 MED ORDER — NORETHINDRONE ACET-ETHINYL EST 1.5-30 MG-MCG PO TABS
1.0000 | ORAL_TABLET | Freq: Every day | ORAL | 4 refills | Status: AC
  Filled 2024-03-08: qty 84, 84d supply, fill #0

## 2024-03-09 ENCOUNTER — Other Ambulatory Visit (HOSPITAL_COMMUNITY): Payer: Self-pay

## 2024-03-09 LAB — URINE CULTURE: Organism ID, Bacteria: NO GROWTH

## 2024-03-10 ENCOUNTER — Other Ambulatory Visit (HOSPITAL_COMMUNITY): Payer: Self-pay

## 2024-03-10 LAB — CERVICOVAGINAL ANCILLARY ONLY
Bacterial Vaginitis (gardnerella): NEGATIVE
Candida Glabrata: NEGATIVE
Candida Vaginitis: NEGATIVE
Comment: NEGATIVE
Comment: NEGATIVE
Comment: NEGATIVE

## 2024-03-12 ENCOUNTER — Other Ambulatory Visit: Payer: Self-pay

## 2024-03-13 ENCOUNTER — Ambulatory Visit: Payer: Self-pay | Admitting: Certified Nurse Midwife

## 2024-03-13 ENCOUNTER — Other Ambulatory Visit: Payer: Self-pay

## 2024-03-14 ENCOUNTER — Other Ambulatory Visit: Payer: Self-pay

## 2024-04-01 ENCOUNTER — Other Ambulatory Visit: Payer: Self-pay | Admitting: Neurology

## 2024-04-01 ENCOUNTER — Other Ambulatory Visit: Payer: Self-pay

## 2024-04-01 MED ORDER — METHYLPHENIDATE HCL ER (OSM) 36 MG PO TBCR
EXTENDED_RELEASE_TABLET | ORAL | 0 refills | Status: AC
  Filled 2024-04-01: qty 60, 30d supply, fill #0

## 2024-04-01 NOTE — Telephone Encounter (Signed)
 LAST SEEN 09/30/23 Next appt 05/05/24 Dispenses   Dispensed Days Supply Quantity Provider Pharmacy  methylphenidate  36 MG PO CR tablet 02/05/2024 30 60 tablet Sater, Charlie LABOR, MD [facility]  methylphenidate  36 MG PO CR tablet 12/05/2023 30 60 tablet Sater, Charlie LABOR, MD [facility]...  methylphenidate  36 MG PO CR tablet 10/02/2023 30 60 tablet Sater, Charlie LABOR, MD [facility] - [facility]...  methylphenidate  36 MG PO CR tablet 07/30/2023 30 60 tablet Sater, Charlie LABOR, MD [facility] - Co...  methylphenidate  36 MG PO CR tablet 05/28/2023 30 60 tablet Sater, Charlie LABOR, MD [facility] - [facility]..[facility]

## 2024-04-02 ENCOUNTER — Other Ambulatory Visit: Payer: Self-pay

## 2024-05-05 ENCOUNTER — Ambulatory Visit: Admitting: Neurology

## 2024-06-18 ENCOUNTER — Encounter
